# Patient Record
Sex: Male | Born: 1960 | Race: White | Hispanic: No | Marital: Single | Smoking: Current every day smoker
Health system: Southern US, Community
[De-identification: ages and names within clinical notes are randomized; demographics above are authoritative.]

## PROBLEM LIST (undated history)

## (undated) DIAGNOSIS — R7303 Prediabetes: Secondary | ICD-10-CM

## (undated) DIAGNOSIS — G4730 Sleep apnea, unspecified: Secondary | ICD-10-CM

## (undated) DIAGNOSIS — I10 Essential (primary) hypertension: Secondary | ICD-10-CM

## (undated) DIAGNOSIS — E785 Hyperlipidemia, unspecified: Secondary | ICD-10-CM

## (undated) DIAGNOSIS — F5104 Psychophysiologic insomnia: Secondary | ICD-10-CM

## (undated) DIAGNOSIS — R0600 Dyspnea, unspecified: Secondary | ICD-10-CM

## (undated) DIAGNOSIS — F419 Anxiety disorder, unspecified: Secondary | ICD-10-CM

## (undated) DIAGNOSIS — K222 Esophageal obstruction: Secondary | ICD-10-CM

## (undated) DIAGNOSIS — K219 Gastro-esophageal reflux disease without esophagitis: Secondary | ICD-10-CM

## (undated) DIAGNOSIS — M1990 Unspecified osteoarthritis, unspecified site: Secondary | ICD-10-CM

## (undated) DIAGNOSIS — K2100 Gastro-esophageal reflux disease with esophagitis, without bleeding: Secondary | ICD-10-CM

## (undated) DIAGNOSIS — I8500 Esophageal varices without bleeding: Secondary | ICD-10-CM

## (undated) DIAGNOSIS — J449 Chronic obstructive pulmonary disease, unspecified: Secondary | ICD-10-CM

## (undated) DIAGNOSIS — M2550 Pain in unspecified joint: Secondary | ICD-10-CM

## (undated) HISTORY — DX: Pain in unspecified joint: M25.50

## (undated) HISTORY — DX: Unspecified osteoarthritis, unspecified site: M19.90

## (undated) HISTORY — DX: Hyperlipidemia, unspecified: E78.5

## (undated) HISTORY — DX: Gastro-esophageal reflux disease with esophagitis: K21.0

## (undated) HISTORY — DX: Essential (primary) hypertension: I10

## (undated) HISTORY — DX: Gastro-esophageal reflux disease with esophagitis, without bleeding: K21.00

---

## 2012-11-18 ENCOUNTER — Ambulatory Visit: Payer: Self-pay

## 2013-11-07 DIAGNOSIS — E785 Hyperlipidemia, unspecified: Secondary | ICD-10-CM | POA: Insufficient documentation

## 2013-11-07 DIAGNOSIS — G43819 Other migraine, intractable, without status migrainosus: Secondary | ICD-10-CM | POA: Insufficient documentation

## 2013-11-07 DIAGNOSIS — K2990 Gastroduodenitis, unspecified, without bleeding: Secondary | ICD-10-CM | POA: Insufficient documentation

## 2013-11-07 DIAGNOSIS — G43119 Migraine with aura, intractable, without status migrainosus: Secondary | ICD-10-CM | POA: Insufficient documentation

## 2014-05-11 DIAGNOSIS — Z79899 Other long term (current) drug therapy: Secondary | ICD-10-CM | POA: Insufficient documentation

## 2017-09-29 ENCOUNTER — Encounter: Payer: Self-pay | Admitting: Nurse Practitioner

## 2017-09-29 ENCOUNTER — Other Ambulatory Visit: Payer: Self-pay

## 2017-09-29 ENCOUNTER — Ambulatory Visit: Payer: Medicaid Other | Attending: Nurse Practitioner | Admitting: Nurse Practitioner

## 2017-09-29 DIAGNOSIS — G4730 Sleep apnea, unspecified: Secondary | ICD-10-CM | POA: Insufficient documentation

## 2017-09-29 DIAGNOSIS — G8929 Other chronic pain: Secondary | ICD-10-CM | POA: Diagnosis not present

## 2017-09-29 DIAGNOSIS — I8500 Esophageal varices without bleeding: Secondary | ICD-10-CM | POA: Diagnosis not present

## 2017-09-29 DIAGNOSIS — M25552 Pain in left hip: Secondary | ICD-10-CM

## 2017-09-29 DIAGNOSIS — M5442 Lumbago with sciatica, left side: Secondary | ICD-10-CM | POA: Diagnosis not present

## 2017-09-29 DIAGNOSIS — F5104 Psychophysiologic insomnia: Secondary | ICD-10-CM | POA: Insufficient documentation

## 2017-09-29 DIAGNOSIS — G894 Chronic pain syndrome: Secondary | ICD-10-CM | POA: Insufficient documentation

## 2017-09-29 DIAGNOSIS — I10 Essential (primary) hypertension: Secondary | ICD-10-CM | POA: Insufficient documentation

## 2017-09-29 DIAGNOSIS — Z7982 Long term (current) use of aspirin: Secondary | ICD-10-CM | POA: Insufficient documentation

## 2017-09-29 DIAGNOSIS — K2970 Gastritis, unspecified, without bleeding: Secondary | ICD-10-CM | POA: Diagnosis not present

## 2017-09-29 DIAGNOSIS — E785 Hyperlipidemia, unspecified: Secondary | ICD-10-CM | POA: Insufficient documentation

## 2017-09-29 DIAGNOSIS — M25561 Pain in right knee: Secondary | ICD-10-CM | POA: Diagnosis not present

## 2017-09-29 DIAGNOSIS — N189 Chronic kidney disease, unspecified: Secondary | ICD-10-CM | POA: Diagnosis not present

## 2017-09-29 DIAGNOSIS — M79605 Pain in left leg: Secondary | ICD-10-CM | POA: Diagnosis not present

## 2017-09-29 DIAGNOSIS — E119 Type 2 diabetes mellitus without complications: Secondary | ICD-10-CM | POA: Insufficient documentation

## 2017-09-29 DIAGNOSIS — M1990 Unspecified osteoarthritis, unspecified site: Secondary | ICD-10-CM | POA: Diagnosis not present

## 2017-09-29 DIAGNOSIS — J449 Chronic obstructive pulmonary disease, unspecified: Secondary | ICD-10-CM | POA: Insufficient documentation

## 2017-09-29 DIAGNOSIS — F419 Anxiety disorder, unspecified: Secondary | ICD-10-CM | POA: Diagnosis not present

## 2017-09-29 DIAGNOSIS — M25551 Pain in right hip: Secondary | ICD-10-CM | POA: Diagnosis not present

## 2017-09-29 DIAGNOSIS — G44009 Cluster headache syndrome, unspecified, not intractable: Secondary | ICD-10-CM | POA: Insufficient documentation

## 2017-09-29 DIAGNOSIS — M79604 Pain in right leg: Secondary | ICD-10-CM | POA: Diagnosis not present

## 2017-09-29 DIAGNOSIS — Z79899 Other long term (current) drug therapy: Secondary | ICD-10-CM | POA: Insufficient documentation

## 2017-09-29 DIAGNOSIS — K222 Esophageal obstruction: Secondary | ICD-10-CM | POA: Insufficient documentation

## 2017-09-29 DIAGNOSIS — I129 Hypertensive chronic kidney disease with stage 1 through stage 4 chronic kidney disease, or unspecified chronic kidney disease: Secondary | ICD-10-CM | POA: Diagnosis not present

## 2017-09-29 DIAGNOSIS — M899 Disorder of bone, unspecified: Secondary | ICD-10-CM

## 2017-09-29 DIAGNOSIS — E1122 Type 2 diabetes mellitus with diabetic chronic kidney disease: Secondary | ICD-10-CM | POA: Diagnosis not present

## 2017-09-29 DIAGNOSIS — K210 Gastro-esophageal reflux disease with esophagitis: Secondary | ICD-10-CM | POA: Insufficient documentation

## 2017-09-29 DIAGNOSIS — Z87891 Personal history of nicotine dependence: Secondary | ICD-10-CM | POA: Insufficient documentation

## 2017-09-29 DIAGNOSIS — Z789 Other specified health status: Secondary | ICD-10-CM

## 2017-09-29 DIAGNOSIS — F17200 Nicotine dependence, unspecified, uncomplicated: Secondary | ICD-10-CM | POA: Insufficient documentation

## 2017-09-29 DIAGNOSIS — M25562 Pain in left knee: Secondary | ICD-10-CM

## 2017-09-29 NOTE — Patient Instructions (Signed)

## 2017-09-29 NOTE — Progress Notes (Signed)
Safety precautions to be maintained throughout the outpatient stay will include: orient to surroundings, keep bed in low position, maintain call bell within reach at all times, provide assistance with transfer out of bed and ambulation.  

## 2017-09-29 NOTE — Progress Notes (Signed)
Patient's Name: Johnny Williams  MRN: [id number]  Referring Provider: Frazier Richards, MD  DOB: 01-Jul-1960  PCP: [facility] [place]  DOS: 09/29/2017  Note by: Dionisio David NP  Service setting: Ambulatory outpatient  Specialty: Interventional Pain Management  Location: [facility] (AMB) Pain Management Facility    Patient type: New Patient    Primary Reason(s) for Visit: Initial Patient Evaluation CC: Back Pain (lower)  HPI  Mr. Shaker is a 57 y.o. year old, male patient, who comes today for an initial evaluation. He has Anxiety; Chronic insomnia; Cluster headaches; COPD (chronic obstructive pulmonary disease) ([place]); Esophageal varices ([place]); Essential hypertension; Gastritis and duodenitis; High risk medication use; Hyperlipidemia, unspecified; Sleep apnea; Smoker; Stricture of esophagus; Type 2 diabetes mellitus without complication ([place]); Variants of migraine, not elsewhere classified, with intractable migraine, so stated, without mention of status migrainosus; Chronic bilateral low back pain with left-sided sciatica (Primary Area of Pain); Chronic pain of both lower extremities (Secondary Area of Pain) (L>R); Chronic pain of both hips [facility] Area of Pain) (L>R); Chronic pain of both knees (Fourth Area of Pain) (L>R); Chronic pain syndrome; Pharmacologic therapy; Disorder of skeletal system; and Problems influencing health status on their problem list.. His primarily concern today is the Back Pain (lower)  Pain Assessment: Location: Lower Back Radiating: left buttocks down to knee Onset: More than a month ago Duration: Chronic pain Quality: Aching, Constant, Throbbing Severity: 10-Worst pain ever/10 (self-reported pain score)  Note: Reported level is compatible with observation. Clinically the patient looks like a 3/10 A 3/10 is viewed as "Moderate" and described as significantly interfering with activities of daily living (ADL). It becomes difficult to feed, bathe, get dressed, get on and off the  toilet or to perform personal hygiene functions. Difficult to get in and out of bed or a chair without assistance. Very distracting. With effort, it can be ignored when deeply involved in activities. Information on the proper use of the pain scale provided to the patient today. When using our objective Pain Scale, levels between 6 and 10/10 are said to belong in an emergency room, as it progressively worsens from a 6/10, described as severely limiting, requiring emergency care not usually available at an outpatient pain management facility. At a 6/10 level, communication becomes difficult and requires great effort. Assistance to reach the emergency department may be required. Facial flushing and profuse sweating along with potentially dangerous increases in heart rate and blood pressure will be evident. Effect on ADL: prolonged walking, sitting, Timing: Constant Modifying factors: rest, cold  Onset and Duration: Present longer than 3 months Cause of pain: Unknown Severity: No change since onset, NAS-11 at its worse: 10/10, NAS-11 at its best: 4/10, NAS-11 now: 10/10 and NAS-11 on the average: 10/10 Timing: Morning and Night Aggravating Factors: Bending, Kneeling, Lifiting, Nerve blocks, Prolonged sitting, Prolonged standing, Squatting, Twisting, Walking, Walking uphill, Walking downhill and Working Alleviating Factors: Stretching, Cold packs, Lying down, Resting and Sleeping Associated Problems: Pain that does not allow patient to sleep Quality of Pain: Sharp Previous Examinations or Tests: X-rays Previous Treatments: Physical Therapy and Stretching exercises  The patient comes into the clinics today for the first time for a chronic pain management evaluation. To the patient his primary area of pain is in his low back. He is a poor historian.He denies any previous surgeries or interventional therapy. He admits that he did to some physical therapy in 2016 which was effective. He went to the  chiropractor in 2017 which made his pain  worse. He admits that he had recent images in [facility].  His second area of pain is in his lower extremities. He admits that the left side is greater than the right. He admits that the pain goes down the back of the legs to his ankle. He denies previous nerve conduction study.  His third area of pain is in his hips. He admits the left is greater than the right. He admits that he has had a previous injection in his left hip which lasted about 30 minutes. He has not noted any physical therapy. He has had recent images.  His last area of pain is in his knees.He admits that the left is greater than the right. He did have a previous steroid injections last year his primary care office which was effective. He denies any  At [place]   Today I took the time to provide the patient with information regarding this pain practice. The patient was informed that the practice is divided into two sections: an interventional pain management section, as well as a completely separate and distinct medication management section. I explained that there are procedure days for interventional therapies, and evaluation days for follow-ups and medication management. Because of the amount of documentation required during both, they are kept separated. This means that there is the possibility that he may be scheduled for a procedure on one day, and medication management the next. I have also informed him that because of staffing and facility limitations, this practice will no longer take patients for medication management only. To illustrate the reasons for this, I gave the patient the example of surgeons, and how inappropriate it would be to refer a patient to his/her care, just to write for the post-surgical antibiotics on a surgery done by a different surgeon.   Because interventional pain management is part of the board-certified specialty for the doctors, the  patient was informed that joining this practice means that they are open to any and all interventional therapies. I made it clear that this does not mean that they will be forced to have any procedures done. What this means is that I believe interventional therapies to be essential part of the diagnosis and proper management of chronic pain conditions. Therefore, patients not interested in these interventional alternatives will be better served under the care of a different practitioner.  The patient was also made aware of my Comprehensive Pain Management Safety Guidelines where by joining this practice, they limit all of their nerve blocks and joint injections to those done by our practice, for as long as we are retained to manage their care. Historic Controlled Substance Pharmacotherapy Review  PMP and historical list of controlled substances: oxycodone/acetaminophen 5/325 Highest opioid analgesic regimen found: oxycodone/acetaminophen 5/325 (fill date 06/25/2016) oxycodone 15 mg per day Most recent opioid analgesic: none Current opioid analgesics: none Highest recorded MME/day: 22.5 mg/day MME/day: 0 mg/day Medications: The patient did not bring the medication(s) to the appointment, as requested in our "New Patient Package" Pharmacodynamics: Desired effects: Analgesia: The patient reports >50% benefit. Reported improvement in function: The patient reports medication allows him to accomplish basic ADLs. Clinically meaningful improvement in function (CMIF): Sustained CMIF goals met Perceived effectiveness: Described as relatively effective, allowing for increase in activities of daily living (ADL) Undesirable effects: Side-effects or Adverse reactions: None reported Historical Monitoring: The patient  has no drug history on file. List of all UDS Test(s): No results found for: MDMA, COCAINSCRNUR, PCPSCRNUR, [place], CANNABQUANT, THCU, [place]  List of all Serum Drug Screening Test(s):  No results  found for: AMPHSCRSER, BARBSCRSER, BENZOSCRSER, COCAINSCRSER, PCPSCRSER, PCPQUANT, THCSCRSER, CANNABQUANT, OPIATESCRSER, OXYSCRSER, PROPOXSCRSER Historical Background Evaluation: [place] PDMP: Six (6) year initial data search conducted.             [place] Department of public safety, offender search: Editor, commissioning Information) Non-contributory Risk Assessment Profile: Aberrant behavior: None observed or detected today Risk factors for fatal opioid overdose: caucasian and male gender Fatal overdose hazard ratio (HR): Calculation deferred Non-fatal overdose hazard ratio (HR): Calculation deferred Risk of opioid abuse or dependence: 0.7-3.0% with doses ? 36 MME/day and 6.1-26% with doses ? 120 MME/day. Substance use disorder (SUD) risk level: Pending results of Medical Psychology Evaluation for SUD Opioid risk tool (ORT) (Total Score): 3  ORT Scoring interpretation table:  Score <3 = Low Risk for SUD  Score between 4-7 = Moderate Risk for SUD  Score >8 = High Risk for Opioid Abuse   PHQ-2 Depression Scale:  Total score: 4  PHQ-2 Scoring interpretation table: (Score and probability of major depressive disorder)  Score 0 = No depression  Score 1 = 15.4% Probability  Score 2 = 21.1% Probability  Score 3 = 38.4% Probability  Score 4 = 45.5% Probability  Score 5 = 56.4% Probability  Score 6 = 78.6% Probability   PHQ-9 Depression Scale:  Total score: 12  PHQ-9 Scoring interpretation table:  Score 0-4 = No depression  Score 5-9 = Mild depression  Score 10-14 = Moderate depression  Score 15-19 = Moderately severe depression  Score 20-27 = Severe depression (2.4 times higher risk of SUD and 2.89 times higher risk of overuse)   Pharmacologic Plan: Pending ordered tests and/or consults  Meds  The patient has a current medication list which includes the following prescription(s): acetaminophen, aspirin-salicylamide-caffeine, atorvastatin, diclofenac sodium, hydrochlorothiazide, multiple vitamins-minerals,  omeprazole, varenicline, and verapamil.  Current Outpatient Medications on File Prior to Visit  Medication Sig  . acetaminophen (TYLENOL) 325 MG tablet Take 650 mg by mouth every 6 (six) hours as needed.  . Aspirin-Salicylamide-Caffeine (ARTHRITIS STRENGTH BC POWDER PO) Take by mouth.  Marland Kitchen atorvastatin (LIPITOR) 40 MG tablet Take 40 mg by mouth daily.  . diclofenac sodium (VOLTAREN) 1 % GEL Apply topically 4 (four) times daily.  . hydrochlorothiazide (HYDRODIURIL) 25 MG tablet Take 25 mg by mouth daily.  . Multiple Vitamins-Minerals (ONE-A-DAY 50 PLUS PO) Take by mouth.  Marland Kitchen omeprazole (PRILOSEC) 20 MG capsule Take 20 mg by mouth daily.  . varenicline (CHANTIX) 1 MG tablet Take 1 mg by mouth 2 (two) times daily.  . verapamil (VERELAN PM) 240 MG 24 hr capsule Take 240 mg by mouth at bedtime.   No current facility-administered medications on file prior to visit.    Imaging Review   Note: Available results from prior imaging studies were reviewed.        ROS  Cardiovascular History: No reported cardiovascular signs or symptoms such as High blood pressure, coronary artery disease, abnormal heart rate or rhythm, heart attack, blood thinner therapy or heart weakness and/or failure Pulmonary or Respiratory History: Lung problems, Shortness of breath, Snoring  and Temporary stoppage of breathing during sleep Neurological History: No reported neurological signs or symptoms such as seizures, abnormal skin sensations, urinary and/or fecal incontinence, being born with an abnormal open spine and/or a tethered spinal cord Review of Past Neurological Studies: No results found for this or any previous visit. Psychological-Psychiatric History: No reported psychological or psychiatric signs or symptoms such as  difficulty sleeping, anxiety, depression, delusions or hallucinations (schizophrenial), mood swings (bipolar disorders) or suicidal ideations or attempts Gastrointestinal History: Reflux or  heatburn Genitourinary History: No reported renal or genitourinary signs or symptoms such as difficulty voiding or producing urine, peeing blood, non-functioning kidney, kidney stones, difficulty emptying the bladder, difficulty controlling the flow of urine, or chronic kidney disease Hematological History: No reported hematological signs or symptoms such as prolonged bleeding, low or poor functioning platelets, bruising or bleeding easily, hereditary bleeding problems, low energy levels due to low hemoglobin or being anemic Endocrine History: No reported endocrine signs or symptoms such as high or low blood sugar, rapid heart rate due to high thyroid levels, obesity or weight gain due to slow thyroid or thyroid disease Rheumatologic History: No reported rheumatological signs and symptoms such as fatigue, joint pain, tenderness, swelling, redness, heat, stiffness, decreased range of motion, with or without associated rash Musculoskeletal History: Negative for myasthenia gravis, muscular dystrophy, multiple sclerosis or malignant hyperthermia Work History: Disabled  Allergies  Mr. Sheckler has No Known Allergies.  Laboratory Chemistry  Inflammation Markers No results found for: CRP, ESRSEDRATE (CRP: Acute Phase) (ESR: Chronic Phase) Renal Function Markers No results found for: BUN, CREATININE, GFRAA, GFRNONAA Hepatic Function Markers No results found for: AST, ALT, ALBUMIN, ALKPHOS, HCVAB Electrolytes No results found for: NA, K, CL, CALCIUM, MG Neuropathy Markers No results found for: [id number] Bone Pathology Markers No results found for: Hendricks Milo, VD125OH2TOT, [id number], [id number], 25OHVITD1, 25OHVITD2, 25OHVITD3, CALCIUM, TESTOFREE, TESTOSTERONE Coagulation Parameters No results found for: INR, LABPROT, APTT, PLT Cardiovascular Markers No results found for: BNP, HGB, HCT Note: Lab results reviewed.  PFSH  Drug: Mr. Fenter  has no drug history on file. Alcohol:  reports that he  drank alcohol. Tobacco:  reports that he has been smoking.  He has a 80.00 pack-year smoking history. He has never used smokeless tobacco. Medical:  has a past medical history of Arthritis, Hyperlipidemia, Hypertension, Joint ache, and Reflux esophagitis. Family: family history includes Heart disease in his mother; Hypertension in his mother.   The histories are not reviewed yet. Please review them in the "History" navigator section and refresh this [place]. Active Ambulatory Problems    Diagnosis Date Noted  . Anxiety 09/29/2017  . Chronic insomnia 09/29/2017  . Cluster headaches 09/29/2017  . COPD (chronic obstructive pulmonary disease) ([place]) 09/29/2017  . Esophageal varices ([place]) 09/29/2017  . Essential hypertension 09/29/2017  . Gastritis and duodenitis 11/07/2013  . High risk medication use 05/11/2014  . Hyperlipidemia, unspecified 11/07/2013  . Sleep apnea 09/29/2017  . Smoker 09/29/2017  . Stricture of esophagus 09/29/2017  . Type 2 diabetes mellitus without complication ([place]) 17/51/0258  . Variants of migraine, not elsewhere classified, with intractable migraine, so stated, without mention of status migrainosus 11/07/2013  . Chronic bilateral low back pain with left-sided sciatica (Primary Area of Pain) 09/29/2017  . Chronic pain of both lower extremities (Secondary Area of Pain) (L>R) 09/29/2017  . Chronic pain of both hips [facility] Area of Pain) (L>R) 09/29/2017  . Chronic pain of both knees (Fourth Area of Pain) (L>R) 09/29/2017  . Chronic pain syndrome 09/29/2017  . Pharmacologic therapy 09/29/2017  . Disorder of skeletal system 09/29/2017  . Problems influencing health status 09/29/2017   Resolved Ambulatory Problems    Diagnosis Date Noted  . No Resolved Ambulatory Problems   Past Medical History:  Diagnosis Date  . Arthritis   . Hyperlipidemia   . Hypertension   . Joint ache   .  Reflux esophagitis    Constitutional Exam  General appearance: Well  nourished, well developed, and well hydrated. In no apparent acute distress Vitals:   09/29/17 1310  BP: 130/81  Pulse: 93  Resp: 16  Temp: 98.2 F (36.8 C)  SpO2: 97%  Weight: 300 lb (136.1 kg)  Height: _0  (1.88 m)   BMI Assessment: Estimated body mass index is 38.52 kg/m as calculated from the following:   Height as of this encounter: _1  (1.88 m).   Weight as of this encounter: 300 lb (136.1 kg).  BMI interpretation table: BMI level Category Range association with higher incidence of chronic pain  <18 kg/m2 Underweight   18.5-24.9 kg/m2 Ideal body weight   25-29.9 kg/m2 Overweight Increased incidence by 20%  30-34.9 kg/m2 Obese (Class I) Increased incidence by 68%  35-39.9 kg/m2 Severe obesity (Class II) Increased incidence by 136%  >40 kg/m2 Extreme obesity (Class III) Increased incidence by 254%   BMI Readings from Last 4 Encounters:  09/29/17 38.52 kg/m   Wt Readings from Last 4 Encounters:  09/29/17 300 lb (136.1 kg)  Psych/Mental status: Alert, oriented x 3 (person, place, & time)       Eyes: PERLA Respiratory: No evidence of acute respiratory distress  Cervical Spine Exam  Inspection: No masses, redness, or swelling Alignment: Symmetrical Functional ROM: Unrestricted ROM      Stability: No instability detected Muscle strength & Tone: Functionally intact Sensory: Unimpaired Palpation: No palpable anomalies              Upper Extremity (UE) Exam    Side: Right upper extremity  Side: Left upper extremity  Inspection: No masses, redness, swelling, or asymmetry. No contractures  Inspection: No masses, redness, swelling, or asymmetry. No contractures  Functional ROM: Unrestricted ROM          Functional ROM: Unrestricted ROM          Muscle strength & Tone: Functionally intact  Muscle strength & Tone: Functionally intact  Sensory: Unimpaired  Sensory: Unimpaired  Palpation: No palpable anomalies              Palpation: No palpable anomalies               Specialized Test(s): Deferred         Specialized Test(s): Deferred          Thoracic Spine Exam  Inspection: No masses, redness, or swelling Alignment: Symmetrical Functional ROM: Unrestricted ROM Stability: No instability detected Sensory: Unimpaired Muscle strength & Tone: No palpable anomalies  Lumbar Spine Exam  Inspection: No masses, redness, or swelling Alignment: Symmetrical Functional ROM: Adequate ROM      Stability: No instability detected Muscle strength & Tone: Functionally intact Sensory: Unimpaired Palpation: Complains of area being tender to palpation       Provocative Tests: Lumbar Hyperextension and rotation test: Positive bilaterally for facet joint pain. Patrick's Maneuver: Positive for left-sided S-I arthralgia and for left hip arthralgia  Gait & Posture Assessment  Ambulation: Unassisted Gait: Relatively normal for age and body habitus Posture: WNL   Lower Extremity Exam    Side: Right lower extremity  Side: Left lower extremity  Inspection: No masses, redness, swelling, or asymmetry. No contractures  Inspection: No masses, redness, swelling, or asymmetry. No contractures  Functional ROM: Unrestricted ROM          Functional ROM: Unrestricted ROM          Muscle strength & Tone: Able to Toe-walk & Heel-walk  without problems  Muscle strength & Tone: Able to Toe-walk & Heel-walk without problems  Sensory: Unimpaired  Sensory: Unimpaired  Palpation: No palpable anomalies  Palpation: No palpable anomalies   Assessment  Primary Diagnosis & Pertinent Problem List: Diagnoses of Chronic bilateral low back pain with left-sided sciatica (Primary Area of Pain), Chronic pain of both lower extremities (Secondary Area of Pain) (L>R), Chronic pain of both hips (Tertiary Area of Pain) (L>R), Chronic pain of both knees (Fourth Area of Pain) (L>R), Chronic pain syndrome, Pharmacologic therapy, Disorder of skeletal system, and Problems influencing health status were  pertinent to this visit.  Visit Diagnosis: 1. Chronic bilateral low back pain with left-sided sciatica (Primary Area of Pain)   2. Chronic pain of both lower extremities (Secondary Area of Pain) (L>R)   3. Chronic pain of both hips [facility] Area of Pain) (L>R)   4. Chronic pain of both knees (Fourth Area of Pain) (L>R)   5. Chronic pain syndrome   6. Pharmacologic therapy   7. Disorder of skeletal system   8. Problems influencing health status    Plan of Care  Initial treatment plan:  Please be advised that as per protocol, today's visit has been an evaluation only. We have not taken over the patient's controlled substance management.  Problem-specific plan: No problem-specific Assessment & Plan notes found for this encounter.  Ordered Lab-work, Procedure(s), Referral(s), & Consult(s): No orders of the defined types were placed in this encounter.  Pharmacotherapy: Medications ordered:  No orders of the defined types were placed in this encounter.  Medications administered during this visit: Chayden Garrelts had no medications administered during this visit.   Pharmacotherapy under consideration:  Opioid Analgesics: The patient was informed that there is no guarantee that he would be a candidate for opioid analgesics. The decision will be made following CDC guidelines. This decision will be based on the results of diagnostic studies, as well as Mr. Soto risk profile.  Membrane stabilizer: To be determined at a later time Muscle relaxant: To be determined at a later time NSAID: To be determined at a later time Other analgesic(s): To be determined at a later time   Interventional therapies under consideration: Mr. Gertz was informed that there is no guarantee that he would be a candidate for interventional therapies. The decision will be based on the results of diagnostic studies, as well as Mr. Merriott risk profile.  Possible procedure(s): Diagnostic bilateral lumbar facet nerve  block Possible bilateral bilateral lumbar facet RFA Diagnostic left sided LESI Diagnostic left intra-articular hip injection Diagnostic left intra-articular knee injection bilateral Hyalgan series Diagnostic left genicular nerve block Possible left genicular nerve artifact   Provider-requested follow-up: Return for 2nd Visit, w/ Dr. Dossie Arbour, after [facility] eval, medical record release.  No future appointments.  Primary Care Physician: [facility] [place] Location: [facility] Outpatient Pain Management Facility Note by:  Date: 09/29/2017; Time: 1:58 PM  Pain Score Disclaimer: We use the NRS-11 scale. This is a self-reported, subjective measurement of pain severity with only modest accuracy. It is used primarily to identify changes within a particular patient. It must be understood that outpatient pain scales are significantly less accurate that those used for research, where they can be applied under ideal controlled circumstances with minimal exposure to variables. In reality, the score is likely to be a combination of pain intensity and pain affect, where pain affect describes the degree of emotional arousal or changes in action readiness caused by the sensory experience of pain.  Factors such as social and work situation, setting, emotional state, anxiety levels, expectation, and prior pain experience may influence pain perception and show large inter-individual differences that may also be affected by time variables.  Patient instructions provided during this appointment: Patient Instructions   ____________________________________________________________________________________________  Appointment Policy Summary  It is our goal and responsibility to provide the medical community with assistance in the evaluation and management of patients with chronic pain. Unfortunately our resources are limited. Because we do not have an unlimited amount of time, or available appointments, we are  required to closely monitor and manage their use. The following rules exist to maximize their use:  Patient's responsibilities: 1. Punctuality:  At what time should I arrive? You should be physically present in our office 30 minutes before your scheduled appointment. Your scheduled appointment is with your assigned healthcare provider. However, it takes 5-10 minutes to be "checked-in", and another 15 minutes for the nurses to do the admission. If you arrive to our office at the time you were given for your appointment, you will end up being at least 20-25 minutes late to your appointment with the provider. 2. Tardiness:  What happens if I arrive only a few minutes after my scheduled appointment time? You will need to reschedule your appointment. The cutoff is your appointment time. This is why it is so important that you arrive at least 30 minutes before that appointment. If you have an appointment scheduled for 10:00 AM and you arrive at 10:01, you will be required to reschedule your appointment.  3. Plan ahead:  Always assume that you will encounter traffic on your way in. Plan for it. If you are dependent on a driver, make sure they understand these rules and the need to arrive early. 4. Other appointments and responsibilities:  Avoid scheduling any other appointments before or after your pain clinic appointments.  5. Be prepared:  Write down everything that you need to discuss with your healthcare provider and give this information to the admitting nurse. Write down the medications that you will need refilled. Bring your pills and bottles (even the empty ones), to all of your appointments, except for those where a procedure is scheduled. 6. No children or pets:  Find someone to take care of them. It is not appropriate to bring them in. 7. Scheduling changes:  We request "advanced notification" of any changes or cancellations. 8. Advanced notification:  Defined as a time period of more than 24  hours prior to the originally scheduled appointment. This allows for the appointment to be offered to other patients. 9. Rescheduling:  When a visit is rescheduled, it will require the cancellation of the original appointment. For this reason they both fall within the category of "Cancellations".  10. Cancellations:  They require advanced notification. Any cancellation less than 24 hours before the  appointment will be recorded as a "No Show". 11. No Show:  Defined as an unkept appointment where the patient failed to notify or declare to the practice their intention or inability to keep the appointment.  Corrective process for repeat offenders:  1. Tardiness: Three (3) episodes of rescheduling due to late arrivals will be recorded as one (1) "No Show". 2. Cancellation or reschedule: Three (3) cancellations or rescheduling will be recorded as one (1) "No Show". 3. "No Shows": Three (3) "No Shows" within a 12 month period will result in discharge from the practice. ____________________________________________________________________________________________  ____________________________________________________________________________________________  Pain Scale  Introduction: The pain score used by this  practice is the Verbal Numerical Rating Scale (VNRS-11). This is an 11-point scale. It is for adults and children 10 years or older. There are significant differences in how the pain score is reported, used, and applied. Forget everything you learned in the past and learn this scoring system.  General Information: The scale should reflect your current level of pain. Unless you are specifically asked for the level of your worst pain, or your average pain. If you are asked for one of these two, then it should be understood that it is over the past 24 hours.  Basic Activities of Daily Living (ADL): Personal hygiene, dressing, eating, transferring, and using restroom.  Instructions: Most patients  tend to report their level of pain as a combination of two factors, their physical pain and their psychosocial pain. This last one is also known as "suffering" and it is reflection of how physical pain affects you socially and psychologically. From now on, report them separately. From this point on, when asked to report your pain level, report only your physical pain. Use the following table for reference.  Pain Clinic Pain Levels (0-5/10)  Pain Level Score  Description  No Pain 0   Mild pain 1 Nagging, annoying, but does not interfere with basic activities of daily living (ADL). Patients are able to eat, bathe, get dressed, toileting (being able to get on and off the toilet and perform personal hygiene functions), transfer (move in and out of bed or a chair without assistance), and maintain continence (able to control bladder and bowel functions). Blood pressure and heart rate are unaffected. A normal heart rate for a healthy adult ranges from 60 to 100 bpm (beats per minute).   Mild to moderate pain 2 Noticeable and distracting. Impossible to hide from other people. More frequent flare-ups. Still possible to adapt and function close to normal. It can be very annoying and may have occasional stronger flare-ups. With discipline, patients may get used to it and adapt.   Moderate pain 3 Interferes significantly with activities of daily living (ADL). It becomes difficult to feed, bathe, get dressed, get on and off the toilet or to perform personal hygiene functions. Difficult to get in and out of bed or a chair without assistance. Very distracting. With effort, it can be ignored when deeply involved in activities.   Moderately severe pain 4 Impossible to ignore for more than a few minutes. With effort, patients may still be able to manage work or participate in some social activities. Very difficult to concentrate. Signs of autonomic nervous system discharge are evident: dilated pupils (mydriasis); mild  sweating (diaphoresis); sleep interference. Heart rate becomes elevated (>115 bpm). Diastolic blood pressure (lower number) rises above 100 mmHg. Patients find relief in laying down and not moving.   Severe pain 5 Intense and extremely unpleasant. Associated with frowning face and frequent crying. Pain overwhelms the senses.  Ability to do any activity or maintain social relationships becomes significantly limited. Conversation becomes difficult. Pacing back and forth is common, as getting into a comfortable position is nearly impossible. Pain wakes you up from deep sleep. Physical signs will be obvious: pupillary dilation; increased sweating; goosebumps; brisk reflexes; cold, clammy hands and feet; nausea, vomiting or dry heaves; loss of appetite; significant sleep disturbance with inability to fall asleep or to remain asleep. When persistent, significant weight loss is observed due to the complete loss of appetite and sleep deprivation.  Blood pressure and heart rate becomes significantly elevated. Caution: If elevated blood  pressure triggers a pounding headache associated with blurred vision, then the patient should immediately seek attention at an urgent or emergency care unit, as these may be signs of an impending stroke.    Emergency Department Pain Levels (6-10/10)  Emergency Room Pain 6 Severely limiting. Requires emergency care and should not be seen or managed at an outpatient pain management facility. Communication becomes difficult and requires great effort. Assistance to reach the emergency department may be required. Facial flushing and profuse sweating along with potentially dangerous increases in heart rate and blood pressure will be evident.   Distressing pain 7 Self-care is very difficult. Assistance is required to transport, or use restroom. Assistance to reach the emergency department will be required. Tasks requiring coordination, such as bathing and getting dressed become very  difficult.   Disabling pain 8 Self-care is no longer possible. At this level, pain is disabling. The individual is unable to do even the most "basic" activities such as walking, eating, bathing, dressing, transferring to a bed, or toileting. Fine motor skills are lost. It is difficult to think clearly.   Incapacitating pain 9 Pain becomes incapacitating. Thought processing is no longer possible. Difficult to remember your own name. Control of movement and coordination are lost.   The worst pain imaginable 10 At this level, most patients pass out from pain. When this level is reached, collapse of the autonomic nervous system occurs, leading to a sudden drop in blood pressure and heart rate. This in turn results in a temporary and dramatic drop in blood flow to the brain, leading to a loss of consciousness. Fainting is one of the body's self defense mechanisms. Passing out puts the brain in a calmed state and causes it to shut down for a while, in order to begin the healing process.    Summary: 1. Refer to this scale when providing [place] with your pain level. 2. Be accurate and careful when reporting your pain level. This will help with your care. 3. Over-reporting your pain level will lead to loss of credibility. 4. Even a level of 1/10 means that there is pain and will be treated at our facility. 5. High, inaccurate reporting will be documented as "Symptom Exaggeration", leading to loss of credibility and suspicions of possible secondary gains such as obtaining more narcotics, or wanting to appear disabled, for fraudulent reasons. 6. Only pain levels of 5 or below will be seen at our facility. 7. Pain levels of 6 and above will be sent to the Emergency Department and the appointment cancelled. ____________________________________________________________________________________________

## 2017-10-03 LAB — 25-HYDROXYVITAMIN D LCMS D2+D3: 25-HYDROXY, VITAMIN D: 27 ng/mL — AB

## 2017-10-03 LAB — COMP. METABOLIC PANEL (12)
AST: 16 IU/L (ref 0–40)
Albumin/Globulin Ratio: 2.2 (ref 1.2–2.2)
Albumin: 4.8 g/dL (ref 3.5–5.5)
Alkaline Phosphatase: 67 IU/L (ref 39–117)
BILIRUBIN TOTAL: 0.2 mg/dL (ref 0.0–1.2)
BUN / CREAT RATIO: 15 (ref 9–20)
BUN: 14 mg/dL (ref 6–24)
CREATININE: 0.91 mg/dL (ref 0.76–1.27)
Calcium: 9.8 mg/dL (ref 8.7–10.2)
Chloride: 102 mmol/L (ref 96–106)
GFR calc Af Amer: 108 mL/min/{1.73_m2} (ref >59)
GFR calc non Af Amer: 93 mL/min/{1.73_m2} (ref >59)
Globulin, Total: 2.2 g/dL (ref 1.5–4.5)
Glucose: 96 mg/dL (ref 65–99)
Potassium: 4.1 mmol/L (ref 3.5–5.2)
Sodium: 144 mmol/L (ref 134–144)
TOTAL PROTEIN: 7 g/dL (ref 6.0–8.5)

## 2017-10-03 LAB — COMPLIANCE DRUG ANALYSIS, UR

## 2017-10-03 LAB — SEDIMENTATION RATE: Sed Rate: 7 mm/hr (ref 0–30)

## 2017-10-03 LAB — MAGNESIUM: MAGNESIUM: 1.9 mg/dL (ref 1.6–2.3)

## 2017-10-03 LAB — C-REACTIVE PROTEIN: CRP: 1 mg/L (ref 0.0–4.9)

## 2017-10-03 LAB — VITAMIN B12: Vitamin B-12: 606 pg/mL (ref 232–1245)

## 2017-10-03 LAB — 25-HYDROXY VITAMIN D LCMS D2+D3: 25-Hydroxy, Vitamin D-3: 27 ng/mL

## 2017-10-12 ENCOUNTER — Other Ambulatory Visit: Payer: Self-pay

## 2017-10-12 ENCOUNTER — Ambulatory Visit
Admission: RE | Admit: 2017-10-12 | Discharge: 2017-10-12 | Disposition: A | Discharge status: Home / Self Care | Payer: Medicaid Other | Source: Ambulatory Visit | Attending: Pain Medicine | Admitting: Pain Medicine

## 2017-10-12 ENCOUNTER — Encounter: Payer: Self-pay | Admitting: Pain Medicine

## 2017-10-12 ENCOUNTER — Ambulatory Visit: Payer: Medicaid Other | Attending: Pain Medicine | Admitting: Pain Medicine

## 2017-10-12 VITALS — BP 125/75 | HR 81 | Temp 97.7°F | Resp 18 | Ht 72.0 in | Wt 290.0 lb

## 2017-10-12 DIAGNOSIS — M79604 Pain in right leg: Principal | ICD-10-CM

## 2017-10-12 DIAGNOSIS — M25551 Pain in right hip: Secondary | ICD-10-CM | POA: Insufficient documentation

## 2017-10-12 DIAGNOSIS — I10 Essential (primary) hypertension: Secondary | ICD-10-CM | POA: Insufficient documentation

## 2017-10-12 DIAGNOSIS — K2970 Gastritis, unspecified, without bleeding: Secondary | ICD-10-CM | POA: Insufficient documentation

## 2017-10-12 DIAGNOSIS — G8929 Other chronic pain: Secondary | ICD-10-CM | POA: Insufficient documentation

## 2017-10-12 DIAGNOSIS — Z79899 Other long term (current) drug therapy: Secondary | ICD-10-CM

## 2017-10-12 DIAGNOSIS — G44009 Cluster headache syndrome, unspecified, not intractable: Secondary | ICD-10-CM | POA: Insufficient documentation

## 2017-10-12 DIAGNOSIS — M25552 Pain in left hip: Secondary | ICD-10-CM

## 2017-10-12 DIAGNOSIS — M25561 Pain in right knee: Secondary | ICD-10-CM

## 2017-10-12 DIAGNOSIS — I8500 Esophageal varices without bleeding: Secondary | ICD-10-CM | POA: Diagnosis not present

## 2017-10-12 DIAGNOSIS — R937 Abnormal findings on diagnostic imaging of other parts of musculoskeletal system: Secondary | ICD-10-CM | POA: Insufficient documentation

## 2017-10-12 DIAGNOSIS — K2980 Duodenitis without bleeding: Secondary | ICD-10-CM | POA: Diagnosis not present

## 2017-10-12 DIAGNOSIS — K222 Esophageal obstruction: Secondary | ICD-10-CM | POA: Insufficient documentation

## 2017-10-12 DIAGNOSIS — M25562 Pain in left knee: Secondary | ICD-10-CM | POA: Diagnosis not present

## 2017-10-12 DIAGNOSIS — M545 Low back pain: Secondary | ICD-10-CM | POA: Insufficient documentation

## 2017-10-12 DIAGNOSIS — E559 Vitamin D deficiency, unspecified: Secondary | ICD-10-CM | POA: Diagnosis not present

## 2017-10-12 DIAGNOSIS — M160 Bilateral primary osteoarthritis of hip: Secondary | ICD-10-CM | POA: Diagnosis not present

## 2017-10-12 DIAGNOSIS — M47814 Spondylosis without myelopathy or radiculopathy, thoracic region: Secondary | ICD-10-CM | POA: Insufficient documentation

## 2017-10-12 DIAGNOSIS — M79605 Pain in left leg: Secondary | ICD-10-CM | POA: Insufficient documentation

## 2017-10-12 DIAGNOSIS — G894 Chronic pain syndrome: Secondary | ICD-10-CM

## 2017-10-12 DIAGNOSIS — M5442 Lumbago with sciatica, left side: Secondary | ICD-10-CM

## 2017-10-12 DIAGNOSIS — Z789 Other specified health status: Secondary | ICD-10-CM | POA: Diagnosis not present

## 2017-10-12 DIAGNOSIS — M166 Other bilateral secondary osteoarthritis of hip: Secondary | ICD-10-CM

## 2017-10-12 DIAGNOSIS — J449 Chronic obstructive pulmonary disease, unspecified: Secondary | ICD-10-CM | POA: Diagnosis not present

## 2017-10-12 DIAGNOSIS — G4733 Obstructive sleep apnea (adult) (pediatric): Secondary | ICD-10-CM | POA: Diagnosis not present

## 2017-10-12 DIAGNOSIS — E785 Hyperlipidemia, unspecified: Secondary | ICD-10-CM | POA: Diagnosis not present

## 2017-10-12 DIAGNOSIS — M47816 Spondylosis without myelopathy or radiculopathy, lumbar region: Secondary | ICD-10-CM | POA: Diagnosis not present

## 2017-10-12 DIAGNOSIS — I700 Atherosclerosis of aorta: Secondary | ICD-10-CM | POA: Diagnosis not present

## 2017-10-12 DIAGNOSIS — F419 Anxiety disorder, unspecified: Secondary | ICD-10-CM | POA: Diagnosis not present

## 2017-10-12 DIAGNOSIS — M899 Disorder of bone, unspecified: Secondary | ICD-10-CM | POA: Diagnosis not present

## 2017-10-12 DIAGNOSIS — E119 Type 2 diabetes mellitus without complications: Secondary | ICD-10-CM | POA: Insufficient documentation

## 2017-10-12 MED ORDER — MAGNESIUM 500 MG PO CAPS: 500.0000 mg | ORAL_CAPSULE | Freq: Two times a day (BID) | ORAL | 5 refills | Status: AC

## 2017-10-12 MED ORDER — CYANOCOBALAMIN 2000 MCG PO TABS: 2000.0000 ug | ORAL_TABLET | Freq: Every day | ORAL | 0 refills | Status: AC

## 2017-10-12 MED ORDER — VITAMIN D3 125 MCG (5000 UT) PO CAPS: 1.0000 | ORAL_CAPSULE | Freq: Every day | ORAL | 5 refills | Status: AC

## 2017-10-12 MED ORDER — GNP CALCIUM 1200 1200-1000 MG-UNIT PO CHEW
1200.0000 mg | CHEWABLE_TABLET | Freq: Every day | ORAL | 5 refills | Status: DC
Start: 2017-10-12 — End: 2018-05-05

## 2017-10-12 MED ORDER — ERGOCALCIFEROL 1.25 MG (50000 UT) PO CAPS: 50000.0000 [IU] | ORAL_CAPSULE | ORAL | 0 refills | Status: AC

## 2017-10-12 NOTE — Progress Notes (Signed)
Patient's Name: Johnny Williams  MRN: [id number]  Referring Provider: [facility], [place]*  DOB: 07-28-1960  PCP: [facility], [place]  DOS: 10/12/2017  Note by: Gaspar Cola, MD  Service setting: Ambulatory outpatient  Specialty: Interventional Pain Management  Location: [facility] (AMB) Pain Management Facility    Patient type: Established   Primary Reason(s) for Visit: Encounter for evaluation before starting new chronic pain management plan of care (Level of risk: moderate) CC: Hip Pain (bilateral, worse in left)  HPI  Johnny Williams is a 57 y.o. year old, male patient, who comes today for a follow-up evaluation to review the test results and decide on a treatment plan. 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 His primarily concern today is the Hip Pain (bilateral, worse in left)  Pain Assessment: Location: Right, Left Hip Radiating: left upper leg Onset: More than a month ago Duration: Chronic pain Quality: Sharp Severity: 5 /10 (self-reported pain score)  Note: Reported level  is inconsistent with clinical observations. Clinically the patient looks like a 3/10 A 3/10 is viewed as "Moderate" and described as significantly interfering with activities of daily living (ADL). It becomes difficult to feed, bathe, get dressed, get on and off the toilet or to perform personal hygiene functions. Difficult to get in and out of bed or a chair without assistance. Very distracting. With effort, it can be ignored when deeply involved in activities. Information on the proper use of the pain scale provided to the patient today. When using our objective Pain Scale, levels between 6 and 10/10 are said to belong in an emergency room, as it progressively worsens from a 6/10, described as severely limiting, requiring emergency care not usually available at an outpatient pain management facility. At a 6/10 level, communication becomes difficult and requires great effort. Assistance to reach the emergency department may be required. Facial flushing and profuse sweating along with potentially dangerous increases in heart rate and blood pressure will be evident. Timing: Intermittent Modifying factors: lying down  Johnny Williams comes in today for a follow-up visit after his initial evaluation on 09/29/2017. Today we went over the results of his tests. These were explained in "Layman's terms". During today's appointment we went over my diagnostic impression, as well as the proposed treatment plan.  According to the patient, his primary area of pain is in his hips. He admits the left is greater than the right (L>R). He admits that he has had a previous injection in his left hip which lasted about 30 minutes. He has not noted any physical therapy. He has had recent images.  His secondary area of pain is in his low back. He is a poor historian. He denies any previous surgeries or interventional therapy. He admits that he did some physical therapy in 2016 which was  effective. He went to the chiropractor in 2017 which  made his pain worse. He admits that he had recent images in [facility].  His tertiary area of pain is in his lower extremities. He admits that the left side is greater than the right (L>R). He admits that the pain goes down the back of the legs to his ankle. He denies previous nerve conduction study.  His fourth area of pain is in his knees.He admits that the left is greater than the right (L>R). He did have a previous steroid injections last year his primary care office which was effective. He denies any  At [facility]  In considering the treatment plan options, Johnny Williams was reminded that I no longer take patients for medication management only. I asked him to let me know if he had no intention of taking advantage of the interventional therapies, so that we could make arrangements to provide this space to someone interested. I also made it clear that undergoing interventional therapies for the purpose of getting pain medications is very inappropriate on the part of a patient, and it will not be tolerated in this practice. This type of behavior would suggest true addiction and therefore it requires referral to an addiction specialist.   Further details on both, my assessment(s), as well as the proposed treatment plan, please see below.  Controlled Substance Pharmacotherapy Assessment REMS (Risk Evaluation and Mitigation Strategy)  Analgesic: none Highest recorded MME/day: 22.5 mg/day MME/day: 0 mg/day Pill Count: None expected due to no prior prescriptions written by our practice. Landis Martins, RN  10/12/2017  8:08 AM  Sign at close encounter Safety precautions to be maintained throughout the outpatient stay will include: orient to surroundings, keep bed in low position, maintain call bell within reach at all times, provide assistance with transfer out of bed and ambulation.    Pharmacokinetics: Liberation and absorption (onset of action): WNL Distribution (time to peak  effect): WNL Metabolism and excretion (duration of action): WNL         Pharmacodynamics: Desired effects: Analgesia: Mr. Capizzi reports >50% benefit. Functional ability: Patient reports that medication allows him to accomplish basic ADLs Clinically meaningful improvement in function (CMIF): Sustained CMIF goals met Perceived effectiveness: Described as relatively effective, allowing for increase in activities of daily living (ADL) Undesirable effects: Side-effects or Adverse reactions: None reported Monitoring: [place] PMP: Online review of the past 80-monthperiod previously conducted. Not applicable at this point since we have not taken over the patient's medication management yet. List of all UDS test(s) done:  Lab Results  Component Value Date   SUMMARY FINAL 09/29/2017   Last UDS on record: Summary  Date Value Ref Range Status  09/29/2017 FINAL  Final    Comment:    ==================================================================== TOXAS[facility]RE COMP DRUG ANALYSIS,UR ==================================================================== Test                             Result       Flag       Units Drug Present and Declared for Prescription Verification   Acetaminophen                  PRESENT      EXPECTED   Salicylate                     PRESENT      EXPECTED   Verapamil  PRESENT      EXPECTED Drug Present not Declared for Prescription Verification   Diphenhydramine                PRESENT      UNEXPECTED Drug Absent but Declared for Prescription Verification   Diclofenac                     Not Detected UNEXPECTED    Topical diclofenac, as indicated in the declared medication list,    is not always detected even when used as directed. ==================================================================== Test                      Result    Flag   Units      Ref Range   Creatinine              163              mg/dL       >=20 ==================================================================== Declared Medications:  The flagging and interpretation on this report are based on the  following declared medications.  Unexpected results may arise from  inaccuracies in the declared medications.  **Note: The testing scope of this panel includes these medications:  Verapamil (Verelan PM)  **Note: The testing scope of this panel does not include small to  moderate amounts of these reported medications:  Acetaminophen  Aspirin  Topical Diclofenac  **Note: The testing scope of this panel does not include following  reported medications:  Atorvastatin (Lipitor)  Caffeine  Hydrochlorothiazide (Hydrodiuril)  Multivitamin  Omeprazole (Prilosec)  Varenicline (Chantix) ==================================================================== For clinical consultation, please call [phone]. ====================================================================    UDS interpretation: No unexpected findings.          Medication Assessment Form: Patient introduced to form today Treatment compliance: Treatment may start today if patient agrees with proposed plan. Evaluation of compliance is not applicable at this point Risk Assessment Profile: Aberrant behavior: See initial evaluations. None observed or detected today Comorbid factors increasing risk of overdose: See initial evaluation. No additional risks detected today Medical Psychology Evaluation: Please see scanned results in medical record. Opioid Risk Tool - 09/29/17 1326      Family History of Substance Abuse   Alcohol  Positive Male    Illegal Drugs  Negative    Rx Drugs  Negative      Personal History of Substance Abuse   Alcohol  Negative    Illegal Drugs  Negative    Rx Drugs  Negative      Psychological Disease   Psychological Disease  Negative    Depression  Negative      Total Score   Opioid Risk Tool Scoring  3    Opioid Risk Interpretation   Low Risk      ORT Scoring interpretation table:  Score <3 = Low Risk for SUD  Score between 4-7 = Moderate Risk for SUD  Score >8 = High Risk for Opioid Abuse   Risk Mitigation Strategies:  Patient opioid safety counseling: Completed today. Counseling provided to patient as per "Patient Counseling Document". Document signed by patient, attesting to counseling and understanding Patient-Prescriber Agreement (PPA): Obtained today.  Controlled substance notification to other providers: Written and sent today.  Pharmacologic Plan: Today we may be taking over the patient's pharmacological regimen. See below.             Laboratory Chemistry  Inflammation Markers (CRP: Acute Phase) (ESR: Chronic Phase) Lab Results  Component  Value Date   CRP 1.0 09/29/2017   ESRSEDRATE 7 09/29/2017                         Renal Function Markers Lab Results  Component Value Date   BUN 14 09/29/2017   CREATININE 0.91 09/29/2017   GFRAA 108 09/29/2017   GFRNONAA 93 09/29/2017                              Hepatic Function Markers Lab Results  Component Value Date   AST 16 09/29/2017   ALBUMIN 4.8 09/29/2017   ALKPHOS 67 09/29/2017                        Electrolytes Lab Results  Component Value Date   NA 144 09/29/2017   K 4.1 09/29/2017   CL 102 09/29/2017   CALCIUM 9.8 09/29/2017   MG 1.9 09/29/2017                        Neuropathy Markers Lab Results  Component Value Date   VITAMINB12 606 09/29/2017                        Bone Pathology Markers Lab Results  Component Value Date   25OHVITD1 27 (L) 09/29/2017   25OHVITD2 <1.0 09/29/2017   25OHVITD3 27 09/29/2017                         Note: Lab results reviewed.  Recent Diagnostic Imaging Review   Complexity Note: No results found under the [facility] electronic medical record.                         Meds   Current Outpatient Medications:  .  acetaminophen (TYLENOL) 325 MG tablet, Take 650 mg by mouth every 6  (six) hours as needed., Disp: , Rfl:  .  Aspirin-Salicylamide-Caffeine (ARTHRITIS STRENGTH BC POWDER PO), Take by mouth., Disp: , Rfl:  .  atorvastatin (LIPITOR) 40 MG tablet, Take 40 mg by mouth daily., Disp: , Rfl:  .  Calcium Carbonate-Vit D-Min (GNP CALCIUM 1200) 1200-1000 MG-UNIT CHEW, Chew 1,200 mg by mouth daily with breakfast. Take in combination with vitamin D and magnesium., Disp: 30 tablet, Rfl: 5 .  Cholecalciferol (VITAMIN D3) 5000 units CAPS, Take 1 capsule (5,000 Units total) by mouth daily with breakfast. Take along with calcium and magnesium., Disp: 30 capsule, Rfl: 5 .  cyanocobalamin ([employer]S VITAMIN B12) 2000 MCG tablet, Take 1 tablet (2,000 mcg total) by mouth daily., Disp: 90 tablet, Rfl: 0 .  diclofenac sodium (VOLTAREN) 1 % GEL, Apply topically 4 (four) times daily., Disp: , Rfl:  .  ergocalciferol (VITAMIN D2) 50000 units capsule, Take 1 capsule (50,000 Units total) by mouth 2 (two) times a week. X 6 weeks., Disp: 12 capsule, Rfl: 0 .  hydrochlorothiazide (HYDRODIURIL) 25 MG tablet, Take 25 mg by mouth daily., Disp: , Rfl:  .  Magnesium 500 MG CAPS, Take 1 capsule (500 mg total) by mouth 2 (two) times daily at 8 am and 10 pm., Disp: 60 capsule, Rfl: 5 .  Multiple Vitamins-Minerals (ONE-A-DAY 50 PLUS PO), Take by mouth., Disp: , Rfl:  .  omeprazole (PRILOSEC) 20 MG capsule, Take 20 mg by mouth daily., Disp: , Rfl:  .  varenicline (CHANTIX) 1 MG tablet, Take 1 mg by mouth 2 (two) times daily., Disp: , Rfl:  .  verapamil (VERELAN PM) 240 MG 24 hr capsule, Take 240 mg by mouth at bedtime., Disp: , Rfl:   ROS  Constitutional: Denies any fever or chills Gastrointestinal: No reported hemesis, hematochezia, vomiting, or acute GI distress Musculoskeletal: Denies any acute onset joint swelling, redness, loss of ROM, or weakness Neurological: No reported episodes of acute onset apraxia, aphasia, dysarthria, agnosia, amnesia, paralysis, loss of coordination, or loss of  consciousness  Allergies  Mr. Owczarzak has No Known Allergies.  PFSH  Drug: Mr. Maya  has no drug history on file. Alcohol:  reports that he drank alcohol. Tobacco:  reports that he has been smoking.  He has a 80.00 pack-year smoking history. He has never used smokeless tobacco. Medical:  has a past medical history of Arthritis, Hyperlipidemia, Hypertension, Joint ache, and Reflux esophagitis. Surgical: Mr. Telford  has no past surgical history on file. Family: family history includes Heart disease in his mother; Hypertension in his mother.  Constitutional Exam  General appearance: Well nourished, well developed, and well hydrated. In no apparent acute distress Vitals:   10/12/17 0803  BP: 125/75  Pulse: 81  Resp: 18  Temp: 97.7 F (36.5 C)  TempSrc: Oral  SpO2: 97%  Weight: 290 lb (131.5 kg)  Height: 6' (1.829 m)  HC: 2" (5.1 cm)   BMI Assessment: Estimated body mass index is 39.33 kg/m as calculated from the following:   Height as of this encounter: 6' (1.829 m).   Weight as of this encounter: 290 lb (131.5 kg).  BMI interpretation table: BMI level Category Range association with higher incidence of chronic pain  <18 kg/m2 Underweight   18.5-24.9 kg/m2 Ideal body weight   25-29.9 kg/m2 Overweight Increased incidence by 20%  30-34.9 kg/m2 Obese (Class I) Increased incidence by 68%  35-39.9 kg/m2 Severe obesity (Class II) Increased incidence by 136%  >40 kg/m2 Extreme obesity (Class III) Increased incidence by 254%   BMI Readings from Last 4 Encounters:  10/12/17 39.33 kg/m  09/29/17 38.52 kg/m   Wt Readings from Last 4 Encounters:  10/12/17 290 lb (131.5 kg)  09/29/17 300 lb (136.1 kg)  Psych/Mental status: Alert, oriented x 3 (person, place, & time)       Eyes: PERLA Respiratory: No evidence of acute respiratory distress  Cervical Spine Area Exam  Skin & Axial Inspection: No masses, redness, edema, swelling, or associated skin lesions Alignment:  Symmetrical Functional ROM: Unrestricted ROM      Stability: No instability detected Muscle Tone/Strength: Functionally intact. No obvious neuro-muscular anomalies detected. Sensory (Neurological): Unimpaired Palpation: No palpable anomalies              Upper Extremity (UE) Exam    Side: Right upper extremity  Side: Left upper extremity  Skin & Extremity Inspection: Skin color, temperature, and hair growth are WNL. No peripheral edema or cyanosis. No masses, redness, swelling, asymmetry, or associated skin lesions. No contractures.  Skin & Extremity Inspection: Skin color, temperature, and hair growth are WNL. No peripheral edema or cyanosis. No masses, redness, swelling, asymmetry, or associated skin lesions. No contractures.  Functional ROM: Unrestricted ROM          Functional ROM: Unrestricted ROM          Muscle Tone/Strength: Functionally intact. No obvious neuro-muscular anomalies detected.  Muscle Tone/Strength: Functionally intact. No obvious neuro-muscular anomalies detected.  Sensory (Neurological): Unimpaired  Sensory (Neurological): Unimpaired          Palpation: No palpable anomalies              Palpation: No palpable anomalies              Specialized Test(s): Deferred         Specialized Test(s): Deferred          Thoracic Spine Area Exam  Skin & Axial Inspection: No masses, redness, or swelling Alignment: Symmetrical Functional ROM: Unrestricted ROM Stability: No instability detected Muscle Tone/Strength: Functionally intact. No obvious neuro-muscular anomalies detected. Sensory (Neurological): Unimpaired Muscle strength & Tone: No palpable anomalies  Lumbar Spine Area Exam  Skin & Axial Inspection: No masses, redness, or swelling Alignment: Symmetrical Functional ROM: Decreased ROM       Stability: No instability detected Muscle Tone/Strength: Functionally intact. No obvious neuro-muscular anomalies detected. Sensory (Neurological): Movement-associated  pain Palpation: Complains of area being tender to palpation       Provocative Tests: Lumbar Hyperextension and rotation test: Positive bilaterally for facet joint pain. Lumbar Lateral bending test: evaluation deferred today       Patrick's Maneuver: Positive             for bilateral hip arthralgia  Gait & Posture Assessment  Ambulation: Limited Gait: Antalgic Posture: Difficulty standing up straight, due to pain   Lower Extremity Exam    Side: Right lower extremity  Side: Left lower extremity  Skin & Extremity Inspection: Skin color, temperature, and hair growth are WNL. No peripheral edema or cyanosis. No masses, redness, swelling, asymmetry, or associated skin lesions. No contractures.  Skin & Extremity Inspection: Skin color, temperature, and hair growth are WNL. No peripheral edema or cyanosis. No masses, redness, swelling, asymmetry, or associated skin lesions. No contractures.  Functional ROM: Unrestricted ROM          Functional ROM: Unrestricted ROM          Muscle Tone/Strength: Functionally intact. No obvious neuro-muscular anomalies detected.  Muscle Tone/Strength: Functionally intact. No obvious neuro-muscular anomalies detected.  Sensory (Neurological): Unimpaired  Sensory (Neurological): Unimpaired  Palpation: No palpable anomalies  Palpation: No palpable anomalies   Assessment & Plan  Primary Diagnosis & Pertinent Problem List: The primary encounter diagnosis was Chronic hip pain (Primary Area of Pain) (Bilateral) (L>R). 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  Visit Diagnosis: 1. Chronic hip pain (Primary Area of Pain) (Bilateral) (L>R)   2. 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 Lumbar facet syndrome (Bilateral) (L>R)   5. Chronic lower extremity pain (Tertiary Area of Pain) (Bilateral) (L>R)   6. Chronic knee pain (Fourth Area of Pain) (Bilateral) (L>R)   7. Chronic pain syndrome   8. Disorder of skeletal system   9. Pharmacologic therapy   10. Problems influencing health status   11. Vitamin D insufficiency    Problems updated and reviewed during this visit: Problem  Osteoarthritis of hip (Bilateral) (L>R)  Lumbar facet syndrome (Bilateral) (L>R)  Cluster Headaches   RIGHT/HEMICRANIA   Chronic low back  pain (Secondary Area of Pain) (Bilateral) with sciatica (Left)  Chronic lower extremity pain (Tertiary Area of Pain) (Bilateral) (L>R)  Chronic hip pain (Primary Area of Pain) (Bilateral) (L>R)  Chronic knee pain (Fourth Area of Pain) (Bilateral) (L>R)  Chronic Pain Syndrome  Variants of Migraine, Not Elsewhere Classified, With Intractable Migraine, So Stated, Without Mention of Status Migrainosus  Vitamin D Insufficiency  Chronic Insomnia  Sleep Apnea   SEVERE OSA   Smoker  Pharmacologic Therapy  Disorder of Skeletal System  Problems Influencing Health Status  High Risk Medication Use   HCTZ, statin, BC's   Gastritis and Duodenitis  Anxiety  Copd (Chronic Obstructive Pulmonary Disease) ([facility])  Esophageal Varices ([facility])   By 02/2001 EGD   Essential Hypertension  Stricture of Esophagus  Type 2 Diabetes Mellitus Without Complication ([facility])  Hyperlipidemia, Unspecified    Plan of Care  Pharmacotherapy (Medications Ordered): Meds ordered this encounter  Medications  . ergocalciferol (VITAMIN D2) 50000 units capsule    Sig: Take 1 capsule (50,000 Units total) by mouth 2 (two) times a week. X 6 weeks.    Dispense:  12  capsule    Refill:  0    Do not add this medication to the electronic "Automatic Refill" notification system. Patient may have prescription filled one day early if pharmacy is closed on scheduled refill date.  . Cholecalciferol (VITAMIN D3) 5000 units CAPS    Sig: Take 1 capsule (5,000 Units total) by mouth daily with breakfast. Take along with calcium and magnesium.    Dispense:  30 capsule    Refill:  5    Do not place medication on "Automatic Refill".  May substitute with similar over-the-counter product.  . Magnesium 500 MG CAPS    Sig: Take 1 capsule (500 mg total) by mouth 2 (two) times daily at 8 am and 10 pm.    Dispense:  60 capsule    Refill:  5    Do not place medication on "Automatic Refill".  The patient may use similar over-the-counter product.  . Calcium Carbonate-Vit D-Min (GNP CALCIUM 1200) 1200-1000 MG-UNIT CHEW    Sig: Chew 1,200 mg by mouth daily with breakfast. Take in combination with vitamin D and magnesium.    Dispense:  30 tablet    Refill:  5    Do not place medication on "Automatic Refill".  May substitute with similar over-the-counter product.  . cyanocobalamin (CVS VITAMIN B12) 2000 MCG tablet    Sig: Take 1 tablet (2,000 mcg total) by mouth daily.    Dispense:  90 tablet    Refill:  0    Do not place medication on "Automatic Refill". Fill one day early if pharmacy is closed on scheduled refill date.    Procedure Orders     HIP INJECTION Lab Orders  No laboratory test(s) ordered today    Imaging Orders     DG Lumbar Spine Complete W/Bend     DG HIP UNILAT W OR W/O PELVIS 2-3 VIEWS RIGHT     DG HIP UNILAT W OR W/O PELVIS 2-3 VIEWS LEFT     DG Knee 1-2 Views Right     DG Knee 1-2 Views Left Referral Orders  No referral(s) requested today    Pharmacological management options:  Opioid Analgesics: We'll take over management today. See above orders Membrane stabilizer: We have discussed the possibility of optimizing this mode of therapy, if  tolerated Muscle relaxant: We have discussed the possibility of a trial NSAID: We have  discussed the possibility of a trial Other analgesic(s): To be determined at a later time   Interventional management options: Planned, scheduled, and/or pending:    Diagnostic bilateral intra-articular hip joint injection #1 under fluoroscopic guidance, no sedation   Considering:   Diagnostic bilateral lumbar facet nerve block Possible bilateral bilateral lumbar facet RFA Diagnostic left sided LESI Diagnostic left intra-articular hip injection Diagnostic left intra-articular knee injection bilateral Hyalgan series Diagnostic left genicular nerve block Possible left genicular nerve artifact   PRN Procedures:   None at this time   Provider-requested follow-up: Return for Procedure (no sedation): (B) IA Hip Inj..  Future Appointments  Date Time Provider [place]  10/27/2017  7:45 AM Milinda Pointer, MD [facility] None    Primary Care Physician: [facility], [place] Location: [facility] Outpatient Pain Management Facility Note by: Gaspar Cola, MD Date: 10/12/2017; Time: 9:10 AM

## 2017-10-12 NOTE — Patient Instructions (Addendum)
____________________________________________________________________________________________  Pain Scale  Introduction: The pain score used by this practice is the Verbal Numerical Rating Scale (VNRS-11). This is an 11-point scale. It is for adults and children 10 years or older. There are significant differences in how the pain score is reported, used, and applied. Forget everything you learned in the past and learn this scoring system.  General Information: The scale should reflect your current level of pain. Unless you are specifically asked for the level of your worst pain, or your average pain. If you are asked for one of these two, then it should be understood that it is over the past 24 hours.  Basic Activities of Daily Living (ADL): Personal hygiene, dressing, eating, transferring, and using restroom.  Instructions: Most patients tend to report their level of pain as a combination of two factors, their physical pain and their psychosocial pain. This last one is also known as "suffering" and it is reflection of how physical pain affects you socially and psychologically. From now on, report them separately. From this point on, when asked to report your pain level, report only your physical pain. Use the following table for reference.  Pain Clinic Pain Levels (0-5/10)  Pain Level Score  Description  No Pain 0   Mild pain 1 Nagging, annoying, but does not interfere with basic activities of daily living (ADL). Patients are able to eat, bathe, get dressed, toileting (being able to get on and off the toilet and perform personal hygiene functions), transfer (move in and out of bed or a chair without assistance), and maintain continence (able to control bladder and bowel functions). Blood pressure and heart rate are unaffected. A normal heart rate for a healthy adult ranges from 60 to 100 bpm (beats per minute).   Mild to moderate pain 2 Noticeable and distracting. Impossible to hide from other  people. More frequent flare-ups. Still possible to adapt and function close to normal. It can be very annoying and may have occasional stronger flare-ups. With discipline, patients may get used to it and adapt.   Moderate pain 3 Interferes significantly with activities of daily living (ADL). It becomes difficult to feed, bathe, get dressed, get on and off the toilet or to perform personal hygiene functions. Difficult to get in and out of bed or a chair without assistance. Very distracting. With effort, it can be ignored when deeply involved in activities.   Moderately severe pain 4 Impossible to ignore for more than a few minutes. With effort, patients may still be able to manage work or participate in some social activities. Very difficult to concentrate. Signs of autonomic nervous system discharge are evident: dilated pupils (mydriasis); mild sweating (diaphoresis); sleep interference. Heart rate becomes elevated (>115 bpm). Diastolic blood pressure (lower number) rises above 100 mmHg. Patients find relief in laying down and not moving.   Severe pain 5 Intense and extremely unpleasant. Associated with frowning face and frequent crying. Pain overwhelms the senses.  Ability to do any activity or maintain social relationships becomes significantly limited. Conversation becomes difficult. Pacing back and forth is common, as getting into a comfortable position is nearly impossible. Pain wakes you up from deep sleep. Physical signs will be obvious: pupillary dilation; increased sweating; goosebumps; brisk reflexes; cold, clammy hands and feet; nausea, vomiting or dry heaves; loss of appetite; significant sleep disturbance with inability to fall asleep or to remain asleep. When persistent, significant weight loss is observed due to the complete loss of appetite and sleep deprivation.  Blood   pressure and heart rate becomes significantly elevated. Caution: If elevated blood pressure triggers a pounding headache  associated with blurred vision, then the patient should immediately seek attention at an urgent or emergency care unit, as these may be signs of an impending stroke.    Emergency Department Pain Levels (6-10/10)  Emergency Room Pain 6 Severely limiting. Requires emergency care and should not be seen or managed at an outpatient pain management facility. Communication becomes difficult and requires great effort. Assistance to reach the emergency department may be required. Facial flushing and profuse sweating along with potentially dangerous increases in heart rate and blood pressure will be evident.   Distressing pain 7 Self-care is very difficult. Assistance is required to transport, or use restroom. Assistance to reach the emergency department will be required. Tasks requiring coordination, such as bathing and getting dressed become very difficult.   Disabling pain 8 Self-care is no longer possible. At this level, pain is disabling. The individual is unable to do even the most "basic" activities such as walking, eating, bathing, dressing, transferring to a bed, or toileting. Fine motor skills are lost. It is difficult to think clearly.   Incapacitating pain 9 Pain becomes incapacitating. Thought processing is no longer possible. Difficult to remember your own name. Control of movement and coordination are lost.   The worst pain imaginable 10 At this level, most patients pass out from pain. When this level is reached, collapse of the autonomic nervous system occurs, leading to a sudden drop in blood pressure and heart rate. This in turn results in a temporary and dramatic drop in blood flow to the brain, leading to a loss of consciousness. Fainting is one of the body's self defense mechanisms. Passing out puts the brain in a calmed state and causes it to shut down for a while, in order to begin the healing process.    Summary: 1. Refer to this scale when providing us with your pain level. 2. Be  accurate and careful when reporting your pain level. This will help with your care. 3. Over-reporting your pain level will lead to loss of credibility. 4. Even a level of 1/10 means that there is pain and will be treated at our facility. 5. High, inaccurate reporting will be documented as "Symptom Exaggeration", leading to loss of credibility and suspicions of possible secondary gains such as obtaining more narcotics, or wanting to appear disabled, for fraudulent reasons. 6. Only pain levels of 5 or below will be seen at our facility. 7. Pain levels of 6 and above will be sent to the Emergency Department and the appointment cancelled. ____________________________________________________________________________________________   ____________________________________________________________________________________________  Preparing for your procedure (without sedation)  Instructions: . Oral Intake: Do not eat or drink anything for at least 3 hours prior to your procedure. . Transportation: Unless otherwise stated by your physician, you may drive yourself after the procedure. . Blood Pressure Medicine: Take your blood pressure medicine with a sip of water the morning of the procedure. . Blood thinners:  . Diabetics on insulin: Notify the staff so that you can be scheduled 1st case in the morning. If your diabetes requires high dose insulin, take only  of your normal insulin dose the morning of the procedure and notify the staff that you have done so. . Preventing infections: Shower with an antibacterial soap the morning of your procedure.  . Build-up your immune system: Take 1000 mg of Vitamin C with every meal (3 times a day) the day prior to   your procedure. . Antibiotics: Inform the staff if you have a condition or reason that requires you to take antibiotics before dental procedures. . Pregnancy: If you are pregnant, call and cancel the procedure. . Sickness: If you have a cold, fever, or any  active infections, call and cancel the procedure. . Arrival: You must be in the facility at least 30 minutes prior to your scheduled procedure. . Children: Do not bring any children with you. . Dress appropriately: Bring dark clothing that you would not mind if they get stained. . Valuables: Do not bring any jewelry or valuables.  Procedure appointments are reserved for interventional treatments only. . No Prescription Refills. . No medication changes will be discussed during procedure appointments. . No disability issues will be discussed.  Remember:  Regular Business hours are:  Monday to Thursday 8:00 AM to 4:00 PM  Provider's Schedule: Amandeep Nesmith, MD:  Procedure days: Tuesday and Thursday 7:30 AM to 4:00 PM  Bilal Lateef, MD:  Procedure days: Monday and Wednesday 7:30 AM to 4:00 PM ____________________________________________________________________________________________    

## 2017-10-12 NOTE — Progress Notes (Signed)
Safety precautions to be maintained throughout the outpatient stay will include: orient to surroundings, keep bed in low position, maintain call bell within reach at all times, provide assistance with transfer out of bed and ambulation.  

## 2017-10-27 ENCOUNTER — Ambulatory Visit: Payer: Medicaid Other | Admitting: Pain Medicine

## 2017-10-27 ENCOUNTER — Other Ambulatory Visit: Payer: Self-pay

## 2017-10-27 ENCOUNTER — Encounter: Payer: Self-pay | Admitting: Pain Medicine

## 2017-10-27 ENCOUNTER — Ambulatory Visit
Admission: RE | Admit: 2017-10-27 | Discharge: 2017-10-27 | Disposition: A | Discharge status: Home / Self Care | Payer: Medicaid Other | Source: Ambulatory Visit | Attending: Pain Medicine | Admitting: Pain Medicine

## 2017-10-27 VITALS — BP 115/65 | HR 70 | Temp 97.8°F | Resp 20 | Ht 74.0 in | Wt 290.0 lb

## 2017-10-27 DIAGNOSIS — M160 Bilateral primary osteoarthritis of hip: Secondary | ICD-10-CM | POA: Diagnosis not present

## 2017-10-27 DIAGNOSIS — M25551 Pain in right hip: Secondary | ICD-10-CM

## 2017-10-27 DIAGNOSIS — G8929 Other chronic pain: Secondary | ICD-10-CM | POA: Diagnosis not present

## 2017-10-27 DIAGNOSIS — Z79899 Other long term (current) drug therapy: Secondary | ICD-10-CM | POA: Diagnosis not present

## 2017-10-27 DIAGNOSIS — M25552 Pain in left hip: Secondary | ICD-10-CM

## 2017-10-27 DIAGNOSIS — M166 Other bilateral secondary osteoarthritis of hip: Secondary | ICD-10-CM

## 2017-10-27 DIAGNOSIS — Z888 Allergy status to other drugs, medicaments and biological substances status: Secondary | ICD-10-CM | POA: Insufficient documentation

## 2017-10-27 MED ORDER — IOPAMIDOL (ISOVUE-M 200) INJECTION 41%
10.0000 mL | Freq: Once | INTRAMUSCULAR | Status: AC
  Administered 2017-10-27: 10 mL via EPIDURAL
  Filled 2017-10-27: qty 10

## 2017-10-27 MED ORDER — LIDOCAINE HCL 2 % IJ SOLN
20.0000 mL | Freq: Once | INTRAMUSCULAR | Status: AC
  Administered 2017-10-27: 400 mg
  Filled 2017-10-27: qty 40

## 2017-10-27 MED ORDER — ROPIVACAINE HCL 2 MG/ML IJ SOLN
9.0000 mL | Freq: Once | INTRAMUSCULAR | Status: AC
  Administered 2017-10-27: 9 mL via INTRA_ARTICULAR
  Filled 2017-10-27: qty 10

## 2017-10-27 MED ORDER — METHYLPREDNISOLONE ACETATE 80 MG/ML IJ SUSP
80.0000 mg | Freq: Once | INTRAMUSCULAR | Status: AC
  Administered 2017-10-27: 80 mg via INTRA_ARTICULAR
  Filled 2017-10-27: qty 1

## 2017-10-27 NOTE — Progress Notes (Signed)
Safety precautions to be maintained throughout the outpatient stay will include: orient to surroundings, keep bed in low position, maintain call bell within reach at all times, provide assistance with transfer out of bed and ambulation.  

## 2017-10-27 NOTE — Progress Notes (Signed)
Patient's Name: Johnny Williams  MRN: [id number]  Referring Provider: [facility], [place]: Apr 23, 1961  PCP: [facility], [place]: 10/27/2017  Note by: Gaspar Cola, MD  Service setting: Ambulatory outpatient  Specialty: Interventional Pain Management  Patient type: Established  Location: [facility] (AMB) Pain Management Facility  Visit type: Interventional Procedure   Primary Reason for Visit: Interventional Pain Management Treatment. CC: Hip Pain (bilateral)  Procedure:       Anesthesia, Analgesia, Anxiolysis:  Type: Intra-Articular Hip Injection #1  Primary Purpose: Diagnostic Region: Posterolateral hip joint area. Level: Lower pelvic and hip joint level. Target Area: Superior aspect of the hip joint cavity, going thru the superior portion of the capsular ligament. Approach: Posterolateral approach. Laterality: Bilateral Position: Prone  Type: Local Anesthesia Indication(s): Analgesia         Route: Infiltration ([place]/IM) IV Access: Declined Sedation: Declined  Local Anesthetic: Lidocaine 1-2%   Indications: 1. 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 Chronic hip pain (Primary Area of Pain) (Bilateral) (L>R)    Pain Score: Pre-procedure: 5 /10 Post-procedure: 0-No pain/10  Pre-op Assessment:  [name] is a 57 y.o. (year old), male patient, seen today for interventional treatment. He  has no past surgical history on file. [name] has a current medication list which includes the following prescription(s): acetaminophen, aspirin-salicylamide-caffeine, atorvastatin, gnp calcium 1200, vitamin d3, cyanocobalamin, diclofenac sodium, ergocalciferol, hydrochlorothiazide, magnesium, multiple vitamins-minerals, omeprazole, varenicline, and verapamil. His primarily concern today is the Hip Pain (bilateral)  Initial Vital Signs:  Pulse/HCG Rate: 70ECG Heart Rate: 74 Temp: 97.8 F (36.6 C) Resp: 16 BP: 129/74 SpO2: 96 %  BMI: Estimated body mass index is 37.23 kg/m  as calculated from the following:   Height as of this encounter: 6\' 2"  (1.88 m).   Weight as of this encounter: 290 lb (131.5 kg).  Risk Assessment: Allergies: Reviewed. He is allergic to fluoxetine.  Allergy Precautions: None required Coagulopathies: Reviewed. None identified.  Blood-thinner therapy: None at this time Active Infection(s): Reviewed. None identified. [name] is afebrile  Site Confirmation: [name] was asked to confirm the procedure and laterality before marking the site Procedure checklist: Completed Consent: Before the procedure and under the influence of no sedative(s), amnesic(s), or anxiolytics, the patient was informed of the treatment options, risks and possible complications. To fulfill our ethical and legal obligations, as recommended by the American Medical Association's Code of Ethics, I have informed the patient of my clinical impression; the nature and purpose of the treatment or procedure; the risks, benefits, and possible complications of the intervention; the alternatives, including doing nothing; the risk(s) and benefit(s) of the alternative treatment(s) or procedure(s); and the risk(s) and benefit(s) of doing nothing. The patient was provided information about the general risks and possible complications associated with the procedure. These may include, but are not limited to: failure to achieve desired goals, infection, bleeding, organ or nerve damage, allergic reactions, paralysis, and death. In addition, the patient was informed of those risks and complications associated to the procedure, such as failure to decrease pain; infection; bleeding; organ or nerve damage with subsequent damage to sensory, motor, and/or autonomic systems, resulting in permanent pain, numbness, and/or weakness of one or several areas of the body; allergic reactions; (i.e.: anaphylactic reaction); and/or death. Furthermore, the patient was informed of those risks and complications  associated with the medications. These include, but are not limited to: allergic reactions (i.e.: anaphylactic or anaphylactoid reaction(s)); adrenal axis suppression; blood sugar elevation that  in diabetics may result in ketoacidosis or comma; water retention that in patients with history of congestive heart failure may result in shortness of breath, pulmonary edema, and decompensation with resultant heart failure; weight gain; swelling or edema; medication-induced neural toxicity; particulate matter embolism and blood vessel occlusion with resultant organ, and/or nervous system infarction; and/or aseptic necrosis of one or more joints. Finally, the patient was informed that Medicine is not an exact science; therefore, there is also the possibility of unforeseen or unpredictable risks and/or possible complications that may result in a catastrophic outcome. The patient indicated having understood very clearly. We have given the patient no guarantees and we have made no promises. Enough time was given to the patient to ask questions, all of which were answered to the patient's satisfaction. [name] has indicated that he wanted to continue with the procedure. Attestation: I, the ordering provider, attest that I have discussed with the patient the benefits, risks, side-effects, alternatives, likelihood of achieving goals, and potential problems during recovery for the procedure that I have provided informed consent. Date  Time: 10/27/2017  8:05 AM  Pre-Procedure Preparation:  Monitoring: As per clinic protocol. Respiration, ETCO2, SpO2, BP, heart rate and rhythm monitor placed and checked for adequate function Safety Precautions: Patient was assessed for positional comfort and pressure points before starting the procedure. Time-out: I initiated and conducted the "Time-out" before starting the procedure, as per protocol. The patient was asked to participate by confirming the accuracy of the "Time Out"  information. Verification of the correct person, site, and procedure were performed and confirmed by me, the nursing staff, and the patient. "Time-out" conducted as per Joint Commission's Universal Protocol (UP.01.01.01). Time: 6301  Description of Procedure Process:   Area Prepped: Entire Posterolateral hip area. Prepping solution: ChloraPrep (2% chlorhexidine gluconate and 70% isopropyl alcohol) Safety Precautions: Aspiration looking for blood return was conducted prior to all injections. At no point did we inject any substances, as a needle was being advanced. No attempts were made at seeking any paresthesias. Safe injection practices and needle disposal techniques used. Medications properly checked for expiration dates. SDV (single dose vial) medications used. Description of the Procedure: Protocol guidelines were followed. The patient was placed in position over the fluoroscopy table. The target area was identified and the area prepped in the usual manner. Skin & deeper tissues infiltrated with local anesthetic. Appropriate amount of time allowed to pass for local anesthetics to take effect. The procedure needles were then advanced to the target area. Proper needle placement secured. Negative aspiration confirmed. Solution injected in intermittent fashion, asking for systemic symptoms every 0.5cc of injectate. The needles were then removed and the area cleansed, making sure to leave some of the prepping solution back to take advantage of its long term bactericidal properties. Vitals:   10/27/17 0836 10/27/17 0842 10/27/17 0847 10/27/17 0850  BP: 114/63 116/64 (!) 109/58 115/65  Pulse:      Resp: (!) 21 (!) 21 20 20   Temp:      SpO2: 94% 94% 92% 95%  Weight:      Height:        Start Time: 0836 hrs. End Time: 0849 hrs. Materials:  Needle(s) Type: Regular needle Gauge: 22G Length: 5-in Medication(s): Please see orders for medications and dosing details.  Imaging Guidance (Non-Spinal):   Type of Imaging Technique: Fluoroscopy Guidance (Non-Spinal) Indication(s): Assistance in needle guidance and placement for procedures requiring needle placement in or near specific anatomical locations not easily accessible without such  assistance. Exposure Time: Please see nurses notes. Contrast: Before injecting any contrast, we confirmed that the patient did not have an allergy to iodine, shellfish, or radiological contrast. Once satisfactory needle placement was completed at the desired level, radiological contrast was injected. Contrast injected under live fluoroscopy. No contrast complications. See chart for type and volume of contrast used. Fluoroscopic Guidance: I was personally present during the use of fluoroscopy. "Tunnel Vision Technique" used to obtain the best possible view of the target area. Parallax error corrected before commencing the procedure. "Direction-depth-direction" technique used to introduce the needle under continuous pulsed fluoroscopy. Once target was reached, antero-posterior, oblique, and lateral fluoroscopic projection used confirm needle placement in all planes. Images permanently stored in EMR. Interpretation: I personally interpreted the imaging intraoperatively. Adequate needle placement confirmed in multiple planes. Appropriate spread of contrast into desired area was observed. No evidence of afferent or efferent intravascular uptake. Permanent images saved into the patient's record.  Antibiotic Prophylaxis:   Anti-infectives (From admission, onward)   None     Indication(s): None identified  Post-operative Assessment:  Post-procedure Vital Signs:  Pulse/HCG Rate: 7075 Temp: 97.8 F (36.6 C) Resp: 20 BP: 115/65 SpO2: 95 %  EBL: None  Complications: No immediate post-treatment complications observed by team, or reported by patient.  Note: The patient tolerated the entire procedure well. A repeat set of vitals were taken after the procedure and the  patient was kept under observation following institutional policy, for this type of procedure. Post-procedural neurological assessment was performed, showing return to baseline, prior to discharge. The patient was provided with post-procedure discharge instructions, including a section on how to identify potential problems. Should any problems arise concerning this procedure, the patient was given instructions to immediately contact us, at any time, without hesitation. In any case, we plan to contact the patient by telephone for a follow-up status report regarding this interventional procedure.  Comments:  No additional relevant information.  Plan of Care    Imaging Orders     DG C-Arm 1-60 Min-No Report  Procedure Orders     HIP INJECTION  Medications ordered for procedure: Meds ordered this encounter  Medications  . iopamidol (ISOVUE-M) 41 % intrathecal injection 10 mL    Must be Myelogram-compatible. If not available, you may substitute with a water-soluble, non-ionic, hypoallergenic, myelogram-compatible radiological contrast medium.  Marland Kitchen lidocaine (XYLOCAINE) 2 % (with pres) injection 400 mg  . methylPREDNISolone acetate (DEPO-MEDROL) injection 80 mg  . ropivacaine (PF) 2 mg/mL (0.2%) (NAROPIN) injection 9 mL   Medications administered: We administered iopamidol, lidocaine, methylPREDNISolone acetate, and ropivacaine (PF) 2 mg/mL (0.2%).  See the medical record for exact dosing, route, and time of administration.  New Prescriptions   No medications on file   Disposition: Discharge home  Discharge Date & Time: 10/27/2017; 0900 hrs.   Physician-requested Follow-up: Return for post-procedure eval (2 wks), w/ Dr. Dossie Arbour.  Future Appointments  Date Time Provider [place]  11/09/2017 10:15 AM Milinda Pointer, MD [facility] None   Primary Care Physician: [facility], [place] Location: [facility] Outpatient Pain Management Facility Note by: Gaspar Cola,  MD Date: 10/27/2017; Time: 11:38 AM  Disclaimer:  Medicine is not an exact science. The only guarantee in medicine is that nothing is guaranteed. It is important to note that the decision to proceed with this intervention was based on the information collected from the patient. The Data and conclusions were drawn from the patient's questionnaire, the interview, and the physical examination. Because the information  was provided in large part by the patient, it cannot be guaranteed that it has not been purposely or unconsciously manipulated. Every effort has been made to obtain as much relevant data as possible for this evaluation. It is important to note that the conclusions that lead to this procedure are derived in large part from the available data. Always take into account that the treatment will also be dependent on availability of resources and existing treatment guidelines, considered by other Pain Management Practitioners as being common knowledge and practice, at the time of the intervention. For Medico-Legal purposes, it is also important to point out that variation in procedural techniques and pharmacological choices are the acceptable norm. The indications, contraindications, technique, and results of the above procedure should only be interpreted and judged by a Board-Certified Interventional Pain Specialist with extensive familiarity and expertise in the same exact procedure and technique.

## 2017-10-27 NOTE — Patient Instructions (Addendum)

## 2017-10-28 ENCOUNTER — Telehealth: Payer: Self-pay

## 2017-10-28 NOTE — Telephone Encounter (Signed)
Pt not at home, wife states pain is better. Stated to call if needed

## 2017-11-09 ENCOUNTER — Other Ambulatory Visit: Payer: Self-pay

## 2017-11-09 ENCOUNTER — Ambulatory Visit: Payer: Medicaid Other | Attending: Pain Medicine | Admitting: Pain Medicine

## 2017-11-09 ENCOUNTER — Encounter: Payer: Self-pay | Admitting: Pain Medicine

## 2017-11-09 VITALS — BP 122/72 | HR 83 | Temp 98.3°F | Resp 22 | Ht 74.0 in | Wt 279.8 lb

## 2017-11-09 DIAGNOSIS — Z7982 Long term (current) use of aspirin: Secondary | ICD-10-CM | POA: Diagnosis not present

## 2017-11-09 DIAGNOSIS — M79605 Pain in left leg: Secondary | ICD-10-CM | POA: Diagnosis not present

## 2017-11-09 DIAGNOSIS — M5416 Radiculopathy, lumbar region: Secondary | ICD-10-CM | POA: Diagnosis not present

## 2017-11-09 DIAGNOSIS — M5136 Other intervertebral disc degeneration, lumbar region: Secondary | ICD-10-CM

## 2017-11-09 DIAGNOSIS — J449 Chronic obstructive pulmonary disease, unspecified: Secondary | ICD-10-CM | POA: Diagnosis not present

## 2017-11-09 DIAGNOSIS — M1610 Unilateral primary osteoarthritis, unspecified hip: Secondary | ICD-10-CM | POA: Diagnosis not present

## 2017-11-09 DIAGNOSIS — Z87891 Personal history of nicotine dependence: Secondary | ICD-10-CM | POA: Diagnosis not present

## 2017-11-09 DIAGNOSIS — Z888 Allergy status to other drugs, medicaments and biological substances status: Secondary | ICD-10-CM | POA: Insufficient documentation

## 2017-11-09 DIAGNOSIS — G8929 Other chronic pain: Secondary | ICD-10-CM | POA: Diagnosis not present

## 2017-11-09 DIAGNOSIS — F419 Anxiety disorder, unspecified: Secondary | ICD-10-CM | POA: Insufficient documentation

## 2017-11-09 DIAGNOSIS — I10 Essential (primary) hypertension: Secondary | ICD-10-CM | POA: Insufficient documentation

## 2017-11-09 DIAGNOSIS — M25551 Pain in right hip: Secondary | ICD-10-CM | POA: Diagnosis not present

## 2017-11-09 DIAGNOSIS — M549 Dorsalgia, unspecified: Secondary | ICD-10-CM | POA: Diagnosis not present

## 2017-11-09 DIAGNOSIS — M25552 Pain in left hip: Secondary | ICD-10-CM | POA: Diagnosis not present

## 2017-11-09 DIAGNOSIS — Z8249 Family history of ischemic heart disease and other diseases of the circulatory system: Secondary | ICD-10-CM | POA: Diagnosis not present

## 2017-11-09 DIAGNOSIS — M51369 Other intervertebral disc degeneration, lumbar region without mention of lumbar back pain or lower extremity pain: Secondary | ICD-10-CM

## 2017-11-09 DIAGNOSIS — M166 Other bilateral secondary osteoarthritis of hip: Secondary | ICD-10-CM

## 2017-11-09 DIAGNOSIS — K222 Esophageal obstruction: Secondary | ICD-10-CM | POA: Insufficient documentation

## 2017-11-09 DIAGNOSIS — E119 Type 2 diabetes mellitus without complications: Secondary | ICD-10-CM | POA: Insufficient documentation

## 2017-11-09 DIAGNOSIS — Z79899 Other long term (current) drug therapy: Secondary | ICD-10-CM | POA: Insufficient documentation

## 2017-11-09 DIAGNOSIS — M47816 Spondylosis without myelopathy or radiculopathy, lumbar region: Secondary | ICD-10-CM | POA: Diagnosis not present

## 2017-11-09 DIAGNOSIS — M5442 Lumbago with sciatica, left side: Secondary | ICD-10-CM | POA: Diagnosis not present

## 2017-11-09 DIAGNOSIS — M79604 Pain in right leg: Secondary | ICD-10-CM

## 2017-11-09 DIAGNOSIS — M5116 Intervertebral disc disorders with radiculopathy, lumbar region: Secondary | ICD-10-CM | POA: Diagnosis not present

## 2017-11-09 DIAGNOSIS — M25561 Pain in right knee: Secondary | ICD-10-CM | POA: Diagnosis not present

## 2017-11-09 DIAGNOSIS — M51379 Other intervertebral disc degeneration, lumbosacral region without mention of lumbar back pain or lower extremity pain: Secondary | ICD-10-CM | POA: Insufficient documentation

## 2017-11-09 DIAGNOSIS — G894 Chronic pain syndrome: Secondary | ICD-10-CM

## 2017-11-09 DIAGNOSIS — E785 Hyperlipidemia, unspecified: Secondary | ICD-10-CM | POA: Diagnosis not present

## 2017-11-09 DIAGNOSIS — K210 Gastro-esophageal reflux disease with esophagitis: Secondary | ICD-10-CM | POA: Diagnosis not present

## 2017-11-09 DIAGNOSIS — M5137 Other intervertebral disc degeneration, lumbosacral region: Secondary | ICD-10-CM | POA: Insufficient documentation

## 2017-11-09 DIAGNOSIS — M25562 Pain in left knee: Secondary | ICD-10-CM

## 2017-11-09 NOTE — Patient Instructions (Addendum)
____________________________________________________________________________________________  Pain Scale  Introduction: The pain score used by this practice is the Verbal Numerical Rating Scale (VNRS-11). This is an 11-point scale. It is for adults and children 10 years or older. There are significant differences in how the pain score is reported, used, and applied. Forget everything you learned in the past and learn this scoring system.  General Information: The scale should reflect your current level of pain. Unless you are specifically asked for the level of your worst pain, or your average pain. If you are asked for one of these two, then it should be understood that it is over the past 24 hours.  Basic Activities of Daily Living (ADL): Personal hygiene, dressing, eating, transferring, and using restroom.  Instructions: Most patients tend to report their level of pain as a combination of two factors, their physical pain and their psychosocial pain. This last one is also known as "suffering" and it is reflection of how physical pain affects you socially and psychologically. From now on, report them separately. From this point on, when asked to report your pain level, report only your physical pain. Use the following table for reference.  Pain Clinic Pain Levels (0-5/10)  Pain Level Score  Description  No Pain 0   Mild pain 1 Nagging, annoying, but does not interfere with basic activities of daily living (ADL). Patients are able to eat, bathe, get dressed, toileting (being able to get on and off the toilet and perform personal hygiene functions), transfer (move in and out of bed or a chair without assistance), and maintain continence (able to control bladder and bowel functions). Blood pressure and heart rate are unaffected. A normal heart rate for a healthy adult ranges from 60 to 100 bpm (beats per minute).   Mild to moderate pain 2 Noticeable and distracting. Impossible to hide from other  people. More frequent flare-ups. Still possible to adapt and function close to normal. It can be very annoying and may have occasional stronger flare-ups. With discipline, patients may get used to it and adapt.   Moderate pain 3 Interferes significantly with activities of daily living (ADL). It becomes difficult to feed, bathe, get dressed, get on and off the toilet or to perform personal hygiene functions. Difficult to get in and out of bed or a chair without assistance. Very distracting. With effort, it can be ignored when deeply involved in activities.   Moderately severe pain 4 Impossible to ignore for more than a few minutes. With effort, patients may still be able to manage work or participate in some social activities. Very difficult to concentrate. Signs of autonomic nervous system discharge are evident: dilated pupils (mydriasis); mild sweating (diaphoresis); sleep interference. Heart rate becomes elevated (>115 bpm). Diastolic blood pressure (lower number) rises above 100 mmHg. Patients find relief in laying down and not moving.   Severe pain 5 Intense and extremely unpleasant. Associated with frowning face and frequent crying. Pain overwhelms the senses.  Ability to do any activity or maintain social relationships becomes significantly limited. Conversation becomes difficult. Pacing back and forth is common, as getting into a comfortable position is nearly impossible. Pain wakes you up from deep sleep. Physical signs will be obvious: pupillary dilation; increased sweating; goosebumps; brisk reflexes; cold, clammy hands and feet; nausea, vomiting or dry heaves; loss of appetite; significant sleep disturbance with inability to fall asleep or to remain asleep. When persistent, significant weight loss is observed due to the complete loss of appetite and sleep deprivation.  Blood   pressure and heart rate becomes significantly elevated. Caution: If elevated blood pressure triggers a pounding headache  associated with blurred vision, then the patient should immediately seek attention at an urgent or emergency care unit, as these may be signs of an impending stroke.    Emergency Department Pain Levels (6-10/10)  Emergency Room Pain 6 Severely limiting. Requires emergency care and should not be seen or managed at an outpatient pain management facility. Communication becomes difficult and requires great effort. Assistance to reach the emergency department may be required. Facial flushing and profuse sweating along with potentially dangerous increases in heart rate and blood pressure will be evident.   Distressing pain 7 Self-care is very difficult. Assistance is required to transport, or use restroom. Assistance to reach the emergency department will be required. Tasks requiring coordination, such as bathing and getting dressed become very difficult.   Disabling pain 8 Self-care is no longer possible. At this level, pain is disabling. The individual is unable to do even the most "basic" activities such as walking, eating, bathing, dressing, transferring to a bed, or toileting. Fine motor skills are lost. It is difficult to think clearly.   Incapacitating pain 9 Pain becomes incapacitating. Thought processing is no longer possible. Difficult to remember your own name. Control of movement and coordination are lost.   The worst pain imaginable 10 At this level, most patients pass out from pain. When this level is reached, collapse of the autonomic nervous system occurs, leading to a sudden drop in blood pressure and heart rate. This in turn results in a temporary and dramatic drop in blood flow to the brain, leading to a loss of consciousness. Fainting is one of the body's self defense mechanisms. Passing out puts the brain in a calmed state and causes it to shut down for a while, in order to begin the healing process.    Summary: 1. Refer to this scale when providing us with your pain level. 2. Be  accurate and careful when reporting your pain level. This will help with your care. 3. Over-reporting your pain level will lead to loss of credibility. 4. Even a level of 1/10 means that there is pain and will be treated at our facility. 5. High, inaccurate reporting will be documented as "Symptom Exaggeration", leading to loss of credibility and suspicions of possible secondary gains such as obtaining more narcotics, or wanting to appear disabled, for fraudulent reasons. 6. Only pain levels of 5 or below will be seen at our facility. 7. Pain levels of 6 and above will be sent to the Emergency Department and the appointment cancelled. ____________________________________________________________________________________________   ____________________________________________________________________________________________  Preparing for Procedure with Sedation  Instructions: . Oral Intake: Do not eat or drink anything for at least 8 hours prior to your procedure. . Transportation: Public transportation is not allowed. Bring an adult driver. The driver must be physically present in our waiting room before any procedure can be started. . Physical Assistance: Bring an adult physically capable of assisting you, in the event you need help. This adult should keep you company at home for at least 6 hours after the procedure. . Blood Pressure Medicine: Take your blood pressure medicine with a sip of water the morning of the procedure. . Blood thinners:  . Diabetics on insulin: Notify the staff so that you can be scheduled 1st case in the morning. If your diabetes requires high dose insulin, take only  of your normal insulin dose the morning of the procedure and   notify the staff that you have done so. . Preventing infections: Shower with an antibacterial soap the morning of your procedure. . Build-up your immune system: Take 1000 mg of Vitamin C with every meal (3 times a day) the day prior to your  procedure. Marland Kitchen Antibiotics: Inform the staff if you have a condition or reason that requires you to take antibiotics before dental procedures. . Pregnancy: If you are pregnant, call and cancel the procedure. . Sickness: If you have a cold, fever, or any active infections, call and cancel the procedure. . Arrival: You must be in the facility at least 30 minutes prior to your scheduled procedure. . Children: Do not bring children with you. . Dress appropriately: Bring dark clothing that you would not mind if they get stained. . Valuables: Do not bring any jewelry or valuables.  Procedure appointments are reserved for interventional treatments only. Marland Kitchen No Prescription Refills. . No medication changes will be discussed during procedure appointments. . No disability issues will be discussed.  Remember:  Regular Business hours are:  Monday to Thursday 8:00 AM to 4:00 PM  Provider's Schedule: Milinda Pointer, MD:  Procedure days: Tuesday and Thursday 7:30 AM to 4:00 PM  Gillis Santa, MD:  Procedure days: Monday and Wednesday 7:30 AM to 4:00 PM ____________________________________________________________________________________________   Pain Management Discharge Instructions  General Discharge Instructions :  If you need to reach your doctor call: Monday-Friday 8:00 am - 4:00 pm at [phone] or toll free [phone].  After clinic hours [phone] to have operator reach doctor.  Bring all of your medication bottles to all your appointments in the pain clinic.  To cancel or reschedule your appointment with Pain Management please remember to call 24 hours in advance to avoid a fee.  Refer to the educational materials which you have been given on: General Risks, I had my Procedure. Discharge Instructions, Post Sedation.  Post Procedure Instructions:  The drugs you were given will stay in your system until tomorrow, so for the next 24 hours you should not drive, make any legal  decisions or drink any alcoholic beverages.  You may eat anything you prefer, but it is better to start with liquids then soups and crackers, and gradually work up to solid foods.  Please notify your doctor immediately if you have any unusual bleeding, trouble breathing or pain that is not related to your normal pain.  Depending on the type of procedure that was done, some parts of your body may feel week and/or numb.  This usually clears up by tonight or the next day.  Walk with the use of an assistive device or accompanied by an adult for the 24 hours.  You may use ice on the affected area for the first 24 hours.  Put ice in a Ziploc bag and cover with a towel and place against area 15 minutes on 15 minutes off.  You may switch to heat after 24 hours.GENERAL RISKS AND COMPLICATIONS  What are the risk, side effects and possible complications? Generally speaking, most procedures are safe.  However, with any procedure there are risks, side effects, and the possibility of complications.  The risks and complications are dependent upon the sites that are lesioned, or the type of nerve block to be performed.  The closer the procedure is to the spine, the more serious the risks are.  Great care is taken when placing the radio frequency needles, block needles or lesioning probes, but sometimes complications can occur. 1. Infection: Any  time there is an injection through the skin, there is a risk of infection.  This is why sterile conditions are used for these blocks.  There are four possible types of infection. 1. Localized skin infection. 2. Central Nervous System Infection-This can be in the form of Meningitis, which can be deadly. 3. Epidural Infections-This can be in the form of an epidural abscess, which can cause pressure inside of the spine, causing compression of the spinal cord with subsequent paralysis. This would require an emergency surgery to decompress, and there are no guarantees that the  patient would recover from the paralysis. 4. Discitis-This is an infection of the intervertebral discs.  It occurs in about 1% of discography procedures.  It is difficult to treat and it may lead to surgery.        2. Pain: the needles have to go through skin and soft tissues, will cause soreness.       3. Damage to internal structures:  The nerves to be lesioned may be near blood vessels or    other nerves which can be potentially damaged.       4. Bleeding: Bleeding is more common if the patient is taking blood thinners such as  aspirin, Coumadin, Ticiid, Plavix, etc., or if he/she have some genetic predisposition  such as hemophilia. Bleeding into the spinal canal can cause compression of the spinal  cord with subsequent paralysis.  This would require an emergency surgery to  decompress and there are no guarantees that the patient would recover from the  paralysis.       5. Pneumothorax:  Puncturing of a lung is a possibility, every time a needle is introduced in  the area of the chest or upper back.  Pneumothorax refers to free air around the  collapsed lung(s), inside of the thoracic cavity (chest cavity).  Another two possible  complications related to a similar event would include: Hemothorax and Chylothorax.   These are variations of the Pneumothorax, where instead of air around the collapsed  lung(s), you may have blood or chyle, respectively.       6. Spinal headaches: They may occur with any procedures in the area of the spine.       7. Persistent CSF (Cerebro-Spinal Fluid) leakage: This is a rare problem, but may occur  with prolonged intrathecal or epidural catheters either due to the formation of a fistulous  track or a dural tear.       8. Nerve damage: By working so close to the spinal cord, there is always a possibility of  nerve damage, which could be as serious as a permanent spinal cord injury with  paralysis.       9. Death:  Although rare, severe deadly allergic reactions known as  "Anaphylactic  reaction" can occur to any of the medications used.      10. Worsening of the symptoms:  We can always make thing worse.  What are the chances of something like this happening? Chances of any of this occuring are extremely low.  By statistics, you have more of a chance of getting killed in a motor vehicle accident: while driving to the hospital than any of the above occurring .  Nevertheless, you should be aware that they are possibilities.  In general, it is similar to taking a shower.  Everybody knows that you can slip, hit your head and get killed.  Does that mean that you should not shower again?  Nevertheless always keep  in mind that statistics do not mean anything if you happen to be on the wrong side of them.  Even if a procedure has a 1 (one) in a 1,000,000 (million) chance of going wrong, it you happen to be that one..Also, keep in mind that by statistics, you have more of a chance of having something go wrong when taking medications.  Who should not have this procedure? If you are on a blood thinning medication (e.g. Coumadin, Plavix, see list of "Blood Thinners"), or if you have an active infection going on, you should not have the procedure.  If you are taking any blood thinners, please inform your physician.  How should I prepare for this procedure?  Do not eat or drink anything at least six hours prior to the procedure.  Bring a driver with you .  It cannot be a taxi.  Come accompanied by an adult that can drive you back, and that is strong enough to help you if your legs get weak or numb from the local anesthetic.  Take all of your medicines the morning of the procedure with just enough water to swallow them.  If you have diabetes, make sure that you are scheduled to have your procedure done first thing in the morning, whenever possible.  If you have diabetes, take only half of your insulin dose and notify our nurse that you have done so as soon as you arrive at the  clinic.  If you are diabetic, but only take blood sugar pills (oral hypoglycemic), then do not take them on the morning of your procedure.  You may take them after you have had the procedure.  Do not take aspirin or any aspirin-containing medications, at least eleven (11) days prior to the procedure.  They may prolong bleeding.  Wear loose fitting clothing that may be easy to take off and that you would not mind if it got stained with Betadine or blood.  Do not wear any jewelry or perfume  Remove any nail coloring.  It will interfere with some of our monitoring equipment.  NOTE: Remember that this is not meant to be interpreted as a complete list of all possible complications.  Unforeseen problems may occur.  BLOOD THINNERS The following drugs contain aspirin or other products, which can cause increased bleeding during surgery and should not be taken for 2 weeks prior to and 1 week after surgery.  If you should need take something for relief of minor pain, you may take acetaminophen which is found in Tylenol,m Datril, Anacin-3 and Panadol. It is not blood thinner. The products listed below are.  Do not take any of the products listed below in addition to any listed on your instruction sheet.  A.P.C or A.P.C with Codeine Codeine Phosphate Capsules #3 Ibuprofen Ridaura  ABC compound Congesprin Imuran rimadil  Advil Cope Indocin Robaxisal  Alka-Seltzer Effervescent Pain Reliever and Antacid Coricidin or Coricidin-D  Indomethacin Rufen  Alka-Seltzer plus Cold Medicine Cosprin Ketoprofen S-A-C Tablets  Anacin Analgesic Tablets or Capsules Coumadin Korlgesic Salflex  Anacin Extra Strength Analgesic tablets or capsules CP-2 Tablets Lanoril Salicylate  Anaprox Cuprimine Capsules Levenox Salocol  Anexsia-D Dalteparin Magan Salsalate  Anodynos Darvon compound Magnesium Salicylate Sine-off  Ansaid Dasin Capsules Magsal Sodium Salicylate  Anturane Depen Capsules Marnal Soma  APF Arthritis pain  formula Dewitt's Pills Measurin Stanback  Argesic Dia-Gesic Meclofenamic Sulfinpyrazone  Arthritis Bayer Timed Release Aspirin Diclofenac Meclomen Sulindac  Arthritis pain formula Anacin Dicumarol Medipren Supac  Analgesic (Safety  coated) Arthralgen Diffunasal Mefanamic Suprofen  Arthritis Strength Bufferin Dihydrocodeine Mepro Compound Suprol  Arthropan liquid Dopirydamole Methcarbomol with Aspirin Synalgos  ASA tablets/Enseals Disalcid Micrainin Tagament  Ascriptin Doan's Midol Talwin  Ascriptin A/D Dolene Mobidin Tanderil  Ascriptin Extra Strength Dolobid Moblgesic Ticlid  Ascriptin with Codeine Doloprin or Doloprin with Codeine Momentum Tolectin  Asperbuf Duoprin Mono-gesic Trendar  Aspergum Duradyne Motrin or Motrin IB Triminicin  Aspirin plain, buffered or enteric coated Durasal Myochrisine Trigesic  Aspirin Suppositories Easprin Nalfon Trillsate  Aspirin with Codeine Ecotrin Regular or Extra Strength Naprosyn Uracel  Atromid-S Efficin Naproxen Ursinus  Auranofin Capsules Elmiron Neocylate Vanquish  Axotal Emagrin Norgesic Verin  Azathioprine Empirin or Empirin with Codeine Normiflo Vitamin E  Azolid Emprazil Nuprin Voltaren  Bayer Aspirin plain, buffered or children's or timed BC Tablets or powders Encaprin Orgaran Warfarin Sodium  Buff-a-Comp Enoxaparin Orudis Zorpin  Buff-a-Comp with Codeine Equegesic Os-Cal-Gesic   Buffaprin Excedrin plain, buffered or Extra Strength Oxalid   Bufferin Arthritis Strength Feldene Oxphenbutazone   Bufferin plain or Extra Strength Feldene Capsules Oxycodone with Aspirin   Bufferin with Codeine Fenoprofen Fenoprofen Pabalate or Pabalate-SF   Buffets II Flogesic Panagesic   Buffinol plain or Extra Strength Florinal or Florinal with Codeine Panwarfarin   Buf-Tabs Flurbiprofen Penicillamine   Butalbital Compound Four-way cold tablets Penicillin   Butazolidin Fragmin Pepto-Bismol   Carbenicillin Geminisyn Percodan   Carna Arthritis Reliever Geopen  Persantine   Carprofen Gold's salt Persistin   Chloramphenicol Goody's Phenylbutazone   Chloromycetin Haltrain Piroxlcam   Clmetidine heparin Plaquenil   Cllnoril Hyco-pap Ponstel   Clofibrate Hydroxy chloroquine Propoxyphen         Before stopping any of these medications, be sure to consult the physician who ordered them.  Some, such as Coumadin (Warfarin) are ordered to prevent or treat serious conditions such as "deep thrombosis", "pumonary embolisms", and other heart problems.  The amount of time that you may need off of the medication may also vary with the medication and the reason for which you were taking it.  If you are taking any of these medications, please make sure you notify your pain physician before you undergo any procedures.         Selective Nerve Root Block Patient Information  Description: Specific nerve roots exit the spinal canal and these nerves can be compressed and inflamed by a bulging disc and bone spurs.  By injecting steroids on the nerve root, we can potentially decrease the inflammation surrounding these nerves, which often leads to decreased pain.  Also, by injecting local anesthesia on the nerve root, this can provide [place] helpful information to give to your referring doctor if it decreases your pain.  Selective nerve root blocks can be done along the spine from the neck to the low back depending on the location of your pain.   After numbing the skin with local anesthesia, a small needle is passed to the nerve root and the position of the needle is verified using x-ray pictures.  After the needle is in correct position, we then deposit the medication.  You may experience a pressure sensation while this is being done.  The entire block usually lasts less than 15 minutes.  Conditions that may be treated with selective nerve root blocks:  Low back and leg pain  Spinal stenosis  Diagnostic block prior to potential surgery  Neck and arm pain  Post  laminectomy syndrome  Preparation for the injection:  1. Do not eat any solid food  or dairy products within 8 hours of your appointment. 2. You may drink clear liquids up to 3 hours before an appointment.  Clear liquids include water, black coffee, juice or soda.  No milk or cream please. 3. You may take your regular medications, including pain medications, with a sip of water before your appointment.  Diabetics should hold regular insulin (if taken separately) and take 1/2 normal NPH dose the morning of the procedure.  Carry some sugar containing items with you to your appointment. 4. A driver must accompany you and be prepared to drive you home after your procedure. 5. Bring all your current medications with you. 6. An IV may be inserted and sedation may be given at the discretion of the physician. 7. A blood pressure cuff, EKG, and other monitors will often be applied during the procedure.  Some patients may need to have extra oxygen administered for a short period. 8. You will be asked to provide medical information, including allergies, prior to the procedure.  We must know immediately if you are taking blood  Thinners (like Coumadin) or if you are allergic to IV iodine contrast (dye).  Possible side-effects: All are usually temporary  Bleeding from needle site  Light headedness  Numbness and tingling  Decreased blood pressure  Weakness in arms/legs  Pressure sensation in back/neck  Pain at injection site (several days)  Possible complications: All are extremely rare  Infection  Nerve injury  Spinal headache (a headache wore with upright position)  Call if you experience:  Fever/chills associated with headache or increased back/neck pain  Headache worsened by an upright position  New onset weakness or numbness of an extremity below the injection site  Hives or difficulty breathing (go to the emergency room)  Inflammation or drainage at the injection  site(s)  Severe back/neck pain greater than usual  New symptoms which are concerning to you  Please note:  Although the local anesthetic injected can often make your back or neck feel good for several hours after the injection the pain will likely return.  It takes 3-5 days for steroids to work on the nerve root. You may not notice any pain relief for at least one week.  If effective, we will often do a series of 3 injections spaced 3-6 weeks apart to maximally decrease your pain.    If you have any questions, please call [phone] [facility]

## 2017-11-09 NOTE — Progress Notes (Signed)
Patient's Name: Johnny Williams  MRN: [id number]  Referring Provider: [facility], [place]*  DOB: Jun 08, 1961  PCP: [facility], [place]  DOS: 11/09/2017  Note by: Gaspar Cola, MD  Service setting: Ambulatory outpatient  Specialty: Interventional Pain Management  Location: [facility] (AMB) Pain Management Facility    Patient type: Established   Primary Reason(s) for Visit: Encounter for post-procedure evaluation of chronic illness with mild to moderate exacerbation CC: Back Pain (left hip pain)  HPI  Johnny Williams is a 57 y.o. year old, male patient, who comes today for a post-procedure evaluation. 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 His primarily concern today is the Back Pain (left hip pain)  Pain Assessment: Location: Left Hip Radiating: The pain is worse in the left hip since last procedure, but no longer travels down the leg. Onset: More than a month ago Duration: Chronic pain Quality: Aching, Constant,  Jabbing Severity: 6 /10 (subjective, self-reported pain score)  Note: Reported level is inconsistent with clinical observations. Clinically the patient looks like a 2/10 A 2/10 is viewed as "Mild to Moderate" and described as noticeable and distracting. Impossible to hide from other people. More frequent flare-ups. Still possible to adapt and function close to normal. It can be very annoying and may have occasional stronger flare-ups. With discipline, patients may get used to it and adapt. Information on the proper use of the pain scale provided to the patient today. When using our objective Pain Scale, levels between 6 and 10/10 are said to belong in an emergency room, as it progressively worsens from a 6/10, described as severely limiting, requiring emergency care not usually available at an outpatient pain management facility. At a 6/10 level, communication becomes difficult and requires great effort. Assistance to reach the emergency department may be required. Facial flushing and profuse sweating along with potentially dangerous increases in heart rate and blood pressure will be evident. Effect on ADL:  have to pace self. Timing: Constant Modifying factors: nothing. BP: 122/72  HR: 83  Johnny Williams comes in today for post-procedure evaluation after the treatment done on 10/28/2017.  Further details on both, my assessment(s), as well as the proposed treatment plan, please see below.  Post-Procedure Assessment  10/27/2017 Procedure: Diagnostic bilateral intra-articular hip joint injection #1 under fluoroscopic guidance, no sedation Pre-procedure pain score:  5/10 Post-procedure pain score: 0/10 (100% relief) Influential Factors: BMI: 35.92 kg/m Intra-procedural challenges: None observed.         Assessment challenges: None detected.              Reported side-effects: None.        Post-procedural adverse reactions or complications: None reported         Sedation: No sedation used. When no  sedatives are used, the analgesic levels obtained are directly associated to the effectiveness of the local anesthetics. However, when sedation is provided, the level of analgesia obtained during the initial 1 hour following the intervention, is believed to be the result of a combination of factors. These factors may include, but are not limited to: 1. The effectiveness of the local anesthetics used. 2. The effects of the analgesic(s) and/or anxiolytic(s) used. 3. The degree of discomfort experienced by the patient at the time of the procedure. 4. The patients ability and reliability in recalling and recording the events. 5. The presence and influence of possible secondary gains and/or psychosocial factors. Reported result: Relief experienced during the 1st hour after the procedure: 80 % (Ultra-Short Term Relief)            Interpretative annotation: Clinically appropriate result. No IV Analgesic or Anxiolytic given, therefore benefits are completely due to Local Anesthetic effects.          Effects of local anesthetic: The analgesic effects attained during this period are directly associated to the localized infiltration of local anesthetics and therefore cary significant diagnostic value as to the etiological location, or anatomical origin, of the pain. Expected duration of relief is directly dependent on the pharmacodynamics of the local anesthetic used. Long-acting (4-6 hours) anesthetics used.  Reported result: Relief during the next 4 to 6 hour after the procedure: 50 % (Short-Term Relief)            Interpretative annotation: Clinically appropriate result. Analgesia during this period is likely to be Local Anesthetic-related.          Long-term benefit: Defined as the period of time past the expected duration of local anesthetics (1 hour for short-acting and 4-6 hours for long-acting). With the possible exception of prolonged sympathetic blockade from the local anesthetics, benefits during this  period are typically attributed to, or associated with, other factors such as analgesic sensory neuropraxia, antiinflammatory effects, or beneficial biochemical changes provided by agents other than the local anesthetics.  Reported result: Extended relief following procedure: 100 % on the right hip and 0% on the left (Long-Term Relief)            Interpretative annotation: Clinically possible results.    Incomplete therapeutic success. Inflammation plays a part in the etiology to the pain. The pain in the area of the right hip clearly was coming from the hip itself. In the case of the left one, hematocrit being coming from somewhere else.  Current benefits: Defined as reported results that persistent at this point in time.   Analgesia: 100 % on the right hip and 0% on the left  Johnny Williams reports improvement of arthralgia on the right side, but not on the left. Function: Johnny Williams reports improvement in function on the right side. ROM: Johnny Williams reports improvement in ROM on the right side. Interpretative annotation: Ongoing benefit. Incomplete therapeutic success. Effective therapeutic approach on the right side, but not on the left.          Interpretation: Results would suggest a successful diagnostic intervention.                  Plan:  Re-assessment of algesic etiology. Based on these results, today I have repeated his physical exam and although he does seem to be having some discomfort coming from the hip joint, as shown in the [place]a maneuver, I also was able to reproduce his pain by having him do a lateral bending  as well as hyperextension and rotation. Reviewing his MRI indicates that he has some severe problems at the L5-S1 foramen that could be associated with this pain. I will be bringing him back as soon as possible for a diagnostic left sided L4 and L5 transforaminal epidural steroid injection under fluoroscopic guidance and IV sedation to determine if this is worth that left hip pain is  coming from.          Laboratory Chemistry  Inflammation Markers (CRP: Acute Phase) (ESR: Chronic Phase) Lab Results  Component Value Date   CRP 1.0 09/29/2017   ESRSEDRATE 7 09/29/2017                         Renal Function Markers Lab Results  Component Value Date   BUN 14 09/29/2017   CREATININE 0.91 09/29/2017   BCR 15 09/29/2017   GFRAA 108 09/29/2017   GFRNONAA 93 09/29/2017                              Hepatic Function Markers Lab Results  Component Value Date   AST 16 09/29/2017   ALBUMIN 4.8 09/29/2017   ALKPHOS 67 09/29/2017                        Electrolytes Lab Results  Component Value Date   NA 144 09/29/2017   K 4.1 09/29/2017   CL 102 09/29/2017   CALCIUM 9.8 09/29/2017   MG 1.9 09/29/2017                        Neuropathy Markers Lab Results  Component Value Date   VITAMINB12 606 09/29/2017                        Bone Pathology Markers Lab Results  Component Value Date   25OHVITD1 27 (L) 09/29/2017   25OHVITD2 <1.0 09/29/2017   25OHVITD3 27 09/29/2017                         Note: Lab results reviewed.  Recent Diagnostic Imaging Results  DG C-Arm 1-60 Min-No Report Fluoroscopy was utilized by the requesting physician.  No radiographic  interpretation.   Complexity Note: I personally reviewed the fluoroscopic imaging of the procedure.                        Meds   Current Outpatient Medications:  .  acetaminophen (TYLENOL) 325 MG tablet, Take 650 mg by mouth every 6 (six) hours as needed., Disp: , Rfl:  .  Aspirin-Salicylamide-Caffeine (ARTHRITIS STRENGTH BC POWDER PO), Take by mouth., Disp: , Rfl:  .  atorvastatin (LIPITOR) 40 MG tablet, Take 40 mg by mouth daily., Disp: , Rfl:  .  Calcium Carbonate-Vit D-Min (GNP CALCIUM 1200) 1200-1000 MG-UNIT CHEW, Chew 1,200 mg by mouth daily with breakfast. Take in combination with vitamin D and magnesium., Disp: 30 tablet, Rfl: 5 .  Cholecalciferol (VITAMIN D3) 5000 units CAPS, Take 1 capsule  (5,000 Units total) by mouth daily with breakfast. Take along with calcium and magnesium., Disp: 30 capsule, Rfl: 5 .  cyanocobalamin ([employer] VITAMIN B12) 2000 MCG tablet, Take 1 tablet (2,000 mcg total) by mouth daily., Disp: 90 tablet, Rfl: 0 .  diclofenac sodium (VOLTAREN) 1 % GEL, Apply topically 4 (  four) times daily., Disp: , Rfl:  .  ergocalciferol (VITAMIN D2) 50000 units capsule, Take 1 capsule (50,000 Units total) by mouth 2 (two) times a week. X 6 weeks., Disp: 12 capsule, Rfl: 0 .  hydrochlorothiazide (HYDRODIURIL) 25 MG tablet, Take 25 mg by mouth daily., Disp: , Rfl:  .  Magnesium 500 MG CAPS, Take 1 capsule (500 mg total) by mouth 2 (two) times daily at 8 am and 10 pm., Disp: 60 capsule, Rfl: 5 .  Multiple Vitamins-Minerals (ONE-A-DAY 50 PLUS PO), Take by mouth., Disp: , Rfl:  .  omeprazole (PRILOSEC) 20 MG capsule, Take 20 mg by mouth daily., Disp: , Rfl:  .  varenicline (CHANTIX) 1 MG tablet, Take 1 mg by mouth 2 (two) times daily., Disp: , Rfl:  .  verapamil (VERELAN PM) 240 MG 24 hr capsule, Take 240 mg by mouth at bedtime., Disp: , Rfl:  No current facility-administered medications for this visit.   Facility-Administered Medications Ordered in Other Visits:  .  fentaNYL (SUBLIMAZE) injection 25-50 mcg, 25-50 mcg, Intravenous, Q5 min PRN, Milinda Pointer, MD, 50 mcg at 11/10/17 0943 .  midazolam (VERSED) 5 MG/5ML injection 1-2 mg, 1-2 mg, Intravenous, Q5 min PRN, Milinda Pointer, MD, 2 mg at 11/10/17 0943 .  ropivacaine (PF) 2 mg/mL (0.2%) (NAROPIN) injection 1 mL, 1 mL, Epidural, Once, Milinda Pointer, MD .  sodium chloride flush (NS) 0.9 % injection 1 mL, 1 mL, Other, Once, Milinda Pointer, MD  ROS  Constitutional: Denies any fever or chills Gastrointestinal: No reported hemesis, hematochezia, vomiting, or acute GI distress Musculoskeletal: Denies any acute onset joint swelling, redness, loss of ROM, or weakness Neurological: No reported episodes of acute onset  apraxia, aphasia, dysarthria, agnosia, amnesia, paralysis, loss of coordination, or loss of consciousness  Allergies  Johnny Williams is allergic to fluoxetine.  PFSH  Drug: Johnny Williams  has no drug history on file. Alcohol:  reports that he drank alcohol. Tobacco:  reports that he has been smoking.  He has a 80.00 pack-year smoking history. He has never used smokeless tobacco. Medical:  has a past medical history of Arthritis, Hyperlipidemia, Hypertension, Joint ache, and Reflux esophagitis. Surgical: Johnny Williams  has no past surgical history on file. Family: family history includes Heart disease in his mother; Hypertension in his mother.  Constitutional Exam  General appearance: Well nourished, well developed, and well hydrated. In no apparent acute distress Vitals:   11/09/17 1021  BP: 122/72  Pulse: 83  Resp: (!) 22  Temp: 98.3 F (36.8 C)  TempSrc: Oral  SpO2: 97%  Weight: 279 lb 12.8 oz (126.9 kg)  Height: 6' 2"  (1.88 m)   BMI Assessment: Estimated body mass index is 35.92 kg/m as calculated from the following:   Height as of this encounter: 6' 2"  (1.88 m).   Weight as of this encounter: 279 lb 12.8 oz (126.9 kg).  BMI interpretation table: BMI level Category Range association with higher incidence of chronic pain  <18 kg/m2 Underweight   18.5-24.9 kg/m2 Ideal body weight   25-29.9 kg/m2 Overweight Increased incidence by 20%  30-34.9 kg/m2 Obese (Class I) Increased incidence by 68%  35-39.9 kg/m2 Severe obesity (Class II) Increased incidence by 136%  >40 kg/m2 Extreme obesity (Class III) Increased incidence by 254%   Patient's current BMI Ideal Body weight  Body mass index is 35.92 kg/m. Ideal body weight: 82.2 kg (181 lb 3.5 oz) Adjusted ideal body weight: 100.1 kg (220 lb 10.4 oz)   BMI Readings from  Last 4 Encounters:  11/10/17 35.95 kg/m  11/09/17 35.92 kg/m  10/27/17 37.23 kg/m  10/12/17 39.33 kg/m   Wt Readings from Last 4 Encounters:  11/10/17 280 lb (127 kg)   11/09/17 279 lb 12.8 oz (126.9 kg)  10/27/17 290 lb (131.5 kg)  10/12/17 290 lb (131.5 kg)  Psych/Mental status: Alert, oriented x 3 (person, place, & time)       Eyes: PERLA Respiratory: No evidence of acute respiratory distress  Cervical Spine Area Exam  Skin & Axial Inspection: No masses, redness, edema, swelling, or associated skin lesions Alignment: Symmetrical Functional ROM: Unrestricted ROM      Stability: No instability detected Muscle Tone/Strength: Functionally intact. No obvious neuro-muscular anomalies detected. Sensory (Neurological): Unimpaired Palpation: No palpable anomalies              Upper Extremity (UE) Exam    Side: Right upper extremity  Side: Left upper extremity  Skin & Extremity Inspection: Skin color, temperature, and hair growth are WNL. No peripheral edema or cyanosis. No masses, redness, swelling, asymmetry, or associated skin lesions. No contractures.  Skin & Extremity Inspection: Skin color, temperature, and hair growth are WNL. No peripheral edema or cyanosis. No masses, redness, swelling, asymmetry, or associated skin lesions. No contractures.  Functional ROM: Unrestricted ROM          Functional ROM: Unrestricted ROM          Muscle Tone/Strength: Functionally intact. No obvious neuro-muscular anomalies detected.  Muscle Tone/Strength: Functionally intact. No obvious neuro-muscular anomalies detected.  Sensory (Neurological): Unimpaired          Sensory (Neurological): Unimpaired          Palpation: No palpable anomalies              Palpation: No palpable anomalies              Specialized Test(s): Deferred         Specialized Test(s): Deferred          Thoracic Spine Area Exam  Skin & Axial Inspection: No masses, redness, or swelling Alignment: Symmetrical Functional ROM: Unrestricted ROM Stability: No instability detected Muscle Tone/Strength: Functionally intact. No obvious neuro-muscular anomalies detected. Sensory (Neurological):  Unimpaired Muscle strength & Tone: No palpable anomalies  Lumbar Spine Area Exam  Skin & Axial Inspection: No masses, redness, or swelling Alignment: Symmetrical Functional ROM: Unrestricted ROM       Stability: No instability detected Muscle Tone/Strength: Functionally intact. No obvious neuro-muscular anomalies detected. Sensory (Neurological): Movement-associated pain Palpation: No palpable anomalies       Provocative Tests: Lumbar Hyperextension and rotation test: deferred today       Lumbar quadrant test (Kemp's test): Positive on the left for foraminal stenosis Lumbar Lateral bending test: Positive ipsilateral radicular pain, on the left. Positive for left-sided foraminal stenosis. Patrick's Maneuver: Positive for bilateral hip pain             FABER test: Negative                    Gait & Posture Assessment  Ambulation: Unassisted Gait: Relatively normal for age and body habitus Posture: WNL   Lower Extremity Exam    Side: Right lower extremity  Side: Left lower extremity  Stability: No instability observed          Stability: No instability observed          Skin & Extremity Inspection: Skin color, temperature, and hair growth are WNL.  No peripheral edema or cyanosis. No masses, redness, swelling, asymmetry, or associated skin lesions. No contractures.  Skin & Extremity Inspection: Skin color, temperature, and hair growth are WNL. No peripheral edema or cyanosis. No masses, redness, swelling, asymmetry, or associated skin lesions. No contractures.  Functional ROM: Unrestricted ROM                  Functional ROM: Unrestricted ROM                  Muscle Tone/Strength: Functionally intact. No obvious neuro-muscular anomalies detected.  Muscle Tone/Strength: Functionally intact. No obvious neuro-muscular anomalies detected.  Sensory (Neurological): Unimpaired  Sensory (Neurological): Unimpaired  Palpation: No palpable anomalies  Palpation: No palpable anomalies   Assessment   Primary Diagnosis & Pertinent Problem List: The primary encounter diagnosis was Chronic hip pain (Primary Area of Pain) (Bilateral) (L>R). 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  Status Diagnosis  Improving Stable Persistent 1. Chronic hip pain (Primary Area of Pain) (Bilateral) (L>R)   2. 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 Chronic lower extremity pain (Tertiary Area of Pain) (Bilateral) (L>R)   5. Chronic knee pain (Fourth Area of Pain) (Bilateral) (L>R)   6. Lumbar facet syndrome (Bilateral) (L>R)   7. Chronic pain syndrome   8. Left lumbar radiculitis   9. DDD (degenerative disc disease), lumbar     Problems updated and reviewed during this visit: No problems updated. Plan of Care  Pharmacotherapy (Medications Ordered): No orders of the defined types were placed in this encounter.  Medications administered today: Johnny Williams had no medications administered during this visit.   Procedure Orders     Lumbar Transforaminal Epidural Lab Orders  No laboratory test(s) ordered today   Imaging Orders  No imaging studies ordered today   Referral Orders  No referral(s) requested today    Interventional management options: Planned, scheduled, and/or pending:   Diagnostic left sided L4 and L5 transforaminal ESI #1 under fluoroscopic guidance and IV sedation    Considering:   Diagnosticbilateral lumbar facet nerve block  Possiblebilateral bilateral lumbar facet RFA  Diagnosticleft sided LESI   Diagnosticleft intra-articular hip injection  Diagnosticleft intra-articular knee injection  Possible bilateral intra-articular Hyalgan knee injection series  Diagnosticleft genicular nerve block  Possibleleft genicular nerve artifact    Palliative PRN treatment(s):   None at this time   Provider-requested follow-up: Return for Procedure (w/ sedation): (L) L4 + L5 TFESI #1.  Future Appointments  Date Time Provider [place]  12/02/2017  8:15 AM Milinda Pointer, MD [facility] None   Primary Care Physician: [facility], [place] Location: [facility] Facility Note by: Gaspar Cola, MD Date: 11/09/2017; Time: 1:47 PM

## 2017-11-09 NOTE — Progress Notes (Signed)
Safety precautions to be maintained throughout the outpatient stay will include: orient to surroundings, keep bed in low position, maintain call bell within reach at all times, provide assistance with transfer out of bed and ambulation.  

## 2017-11-10 ENCOUNTER — Ambulatory Visit: Payer: Medicaid Other | Admitting: Pain Medicine

## 2017-11-10 ENCOUNTER — Other Ambulatory Visit: Payer: Self-pay

## 2017-11-10 ENCOUNTER — Encounter: Payer: Self-pay | Admitting: Pain Medicine

## 2017-11-10 ENCOUNTER — Ambulatory Visit
Admission: RE | Admit: 2017-11-10 | Discharge: 2017-11-10 | Disposition: A | Discharge status: Home / Self Care | Payer: Medicaid Other | Source: Ambulatory Visit | Attending: Pain Medicine | Admitting: Pain Medicine

## 2017-11-10 VITALS — BP 129/67 | HR 76 | Temp 97.3°F | Resp 16 | Ht 74.0 in | Wt 280.0 lb

## 2017-11-10 DIAGNOSIS — M79604 Pain in right leg: Secondary | ICD-10-CM | POA: Diagnosis not present

## 2017-11-10 DIAGNOSIS — M5116 Intervertebral disc disorders with radiculopathy, lumbar region: Secondary | ICD-10-CM | POA: Insufficient documentation

## 2017-11-10 DIAGNOSIS — M79605 Pain in left leg: Secondary | ICD-10-CM | POA: Diagnosis not present

## 2017-11-10 DIAGNOSIS — M549 Dorsalgia, unspecified: Secondary | ICD-10-CM | POA: Diagnosis present

## 2017-11-10 DIAGNOSIS — G8929 Other chronic pain: Secondary | ICD-10-CM | POA: Diagnosis not present

## 2017-11-10 DIAGNOSIS — M5136 Other intervertebral disc degeneration, lumbar region: Secondary | ICD-10-CM

## 2017-11-10 DIAGNOSIS — M5442 Lumbago with sciatica, left side: Secondary | ICD-10-CM

## 2017-11-10 DIAGNOSIS — M5416 Radiculopathy, lumbar region: Secondary | ICD-10-CM

## 2017-11-10 MED ORDER — LACTATED RINGERS IV SOLN
1000.0000 mL | Freq: Once | INTRAVENOUS | Status: AC
  Administered 2017-11-10: 1000 mL via INTRAVENOUS

## 2017-11-10 MED ORDER — ROPIVACAINE HCL 2 MG/ML IJ SOLN
1.0000 mL | Freq: Once | INTRAMUSCULAR | Status: AC
  Administered 2017-11-10: 2 mL via EPIDURAL
  Filled 2017-11-10: qty 10

## 2017-11-10 MED ORDER — LIDOCAINE HCL 2 % IJ SOLN
20.0000 mL | Freq: Once | INTRAMUSCULAR | Status: AC
  Administered 2017-11-10: 400 mg
  Filled 2017-11-10: qty 40

## 2017-11-10 MED ORDER — DEXAMETHASONE SODIUM PHOSPHATE 10 MG/ML IJ SOLN
10.0000 mg | Freq: Once | INTRAMUSCULAR | Status: AC
  Administered 2017-11-10: 10 mg
  Filled 2017-11-10: qty 1

## 2017-11-10 MED ORDER — ROPIVACAINE HCL 2 MG/ML IJ SOLN
1.0000 mL | Freq: Once | INTRAMUSCULAR | Status: DC
  Filled 2017-11-10: qty 10

## 2017-11-10 MED ORDER — MIDAZOLAM HCL 5 MG/5ML IJ SOLN
1.0000 mg | INTRAMUSCULAR | Status: DC | PRN
  Administered 2017-11-10: 2 mg via INTRAVENOUS
  Filled 2017-11-10: qty 5

## 2017-11-10 MED ORDER — SODIUM CHLORIDE 0.9% FLUSH: 1.0000 mL | Freq: Once | INTRAVENOUS | Status: DC

## 2017-11-10 MED ORDER — IOPAMIDOL (ISOVUE-M 200) INJECTION 41%
10.0000 mL | Freq: Once | INTRAMUSCULAR | Status: AC
  Administered 2017-11-10: 10 mL via EPIDURAL
  Filled 2017-11-10: qty 10

## 2017-11-10 MED ORDER — FENTANYL CITRATE (PF) 100 MCG/2ML IJ SOLN
25.0000 ug | INTRAMUSCULAR | Status: DC | PRN
  Administered 2017-11-10: 50 ug via INTRAVENOUS
  Filled 2017-11-10: qty 2

## 2017-11-10 MED ORDER — SODIUM CHLORIDE 0.9% FLUSH
1.0000 mL | Freq: Once | INTRAVENOUS | Status: AC
  Administered 2017-11-10: 2 mL

## 2017-11-10 NOTE — Progress Notes (Signed)
Patient's Name: Johnny Williams  MRN: [id number]  Referring Provider: [facility], [place]: [birth year]  PCP: [facility], [place]  DOS: 11/10/2017  Note by: Gaspar Cola, MD  Service setting: Ambulatory outpatient  Specialty: Interventional Pain Management  Patient type: Established  Location: [facility] (AMB) Pain Management Facility  Visit type: Interventional Procedure   Primary Reason for Visit: Interventional Pain Management Treatment. CC: Back Pain (left hip)  Procedure:  Anesthesia, Analgesia, Anxiolysis:  Type: Trans-Foraminal Epidural Steroid Injection #1 Purpose: Diagnostic Region: Posterolateral Lumbosacral Level: L4 & L5 Level Laterality: Left-Sided Paravertebral Target Area: The 6 o'clock position under the pedicle, on the affected side. Approach: Posterior Percutaneous Paravertebral approach. Position: Prone  Type: Moderate (Conscious) Sedation combined with Local Anesthesia Indication(s): Analgesia and Anxiety Route: Intravenous (IV) IV Access: Secured Sedation: Meaningful verbal contact was maintained at all times during the procedure  Local Anesthetic: Lidocaine 1-2%   Indications: 1. DDD (degenerative disc disease), lumbar   2. Left lumbar radiculitis   3. Chronic lower extremity pain (Tertiary Area of Pain) (Bilateral) (L>R)   4. 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 (year old), male patient, seen today for interventional treatment. He  has no past surgical history on file. Johnny Williams has a current medication list which includes the following prescription(s): acetaminophen, aspirin-salicylamide-caffeine, atorvastatin, gnp calcium 1200, vitamin d3, cyanocobalamin, diclofenac sodium, ergocalciferol, hydrochlorothiazide, magnesium, multiple vitamins-minerals, omeprazole, varenicline, and verapamil, and the  following Facility-Administered Medications: fentanyl, midazolam, ropivacaine (pf) 2 mg/ml (0.2%), and sodium chloride flush. His primarily concern today is the Back Pain (left hip)  Initial Vital Signs:  Pulse/HCG Rate: 83ECG Heart Rate: 73 Temp: 97.8 F (36.6 C) Resp: 17 BP: 128/82 SpO2: 94 %  BMI: Estimated body mass index is 35.95 kg/m as calculated from the following:   Height as of this encounter: 6\' 2"  (1.88 m).   Weight as of this encounter: 280 lb (127 kg).  Risk Assessment: Allergies: Reviewed. He is allergic to fluoxetine.  Allergy Precautions: None required Coagulopathies: Reviewed. None identified.  Blood-thinner therapy: None at this time Active Infection(s): Reviewed. None identified. Johnny Williams is afebrile  Site Confirmation: Johnny Williams was asked to confirm the procedure and laterality before marking the site Procedure checklist: Completed Consent: Before the procedure and under the influence of no sedative(s), amnesic(s), or anxiolytics, the patient was informed of the treatment options, risks and possible complications. To fulfill our ethical and legal obligations, as recommended by the American Medical Association's Code of Ethics, I have informed the patient of my clinical impression; the nature and purpose of the treatment or procedure; the risks, benefits, and possible complications of the intervention; the alternatives, including doing nothing; the risk(s) and benefit(s) of the alternative treatment(s) or procedure(s); and the risk(s) and benefit(s) of doing nothing. The patient was provided information about the general risks and possible complications associated with the procedure. These may include, but are not limited to: failure to achieve desired goals, infection, bleeding, organ or nerve damage, allergic reactions, paralysis, and death. In addition, the patient was informed of those risks and complications associated to Spine-related procedures, such as failure to  decrease pain; infection (i.e.: Meningitis, epidural or intraspinal abscess); bleeding (i.e.: epidural hematoma, subarachnoid hemorrhage, or any other type of intraspinal or peri-dural bleeding); organ or nerve damage (i.e.: Any type of peripheral nerve, nerve root, or  spinal cord injury) with subsequent damage to sensory, motor, and/or autonomic systems, resulting in permanent pain, numbness, and/or weakness of one or several areas of the body; allergic reactions; (i.e.: anaphylactic reaction); and/or death. Furthermore, the patient was informed of those risks and complications associated with the medications. These include, but are not limited to: allergic reactions (i.e.: anaphylactic or anaphylactoid reaction(s)); adrenal axis suppression; blood sugar elevation that in diabetics may result in ketoacidosis or comma; water retention that in patients with history of congestive heart failure may result in shortness of breath, pulmonary edema, and decompensation with resultant heart failure; weight gain; swelling or edema; medication-induced neural toxicity; particulate matter embolism and blood vessel occlusion with resultant organ, and/or nervous system infarction; and/or aseptic necrosis of one or more joints. Finally, the patient was informed that Medicine is not an exact science; therefore, there is also the possibility of unforeseen or unpredictable risks and/or possible complications that may result in a catastrophic outcome. The patient indicated having understood very clearly. We have given the patient no guarantees and we have made no promises. Enough time was given to the patient to ask questions, all of which were answered to the patient's satisfaction. Johnny Williams has indicated that he wanted to continue with the procedure. Attestation: I, the ordering provider, attest that I have discussed with the patient the benefits, risks, side-effects, alternatives, likelihood of achieving goals, and potential  problems during recovery for the procedure that I have provided informed consent. Date  Time: 11/10/2017  8:50 AM  Pre-Procedure Preparation:  Monitoring: As per clinic protocol. Respiration, ETCO2, SpO2, BP, heart rate and rhythm monitor placed and checked for adequate function Safety Precautions: Patient was assessed for positional comfort and pressure points before starting the procedure. Time-out: I initiated and conducted the "Time-out" before starting the procedure, as per protocol. The patient was asked to participate by confirming the accuracy of the "Time Out" information. Verification of the correct person, site, and procedure were performed and confirmed by me, the nursing staff, and the patient. "Time-out" conducted as per Joint Commission's Universal Protocol (UP.01.01.01). Time: 1001  Description of Procedure:       Area Prepped: Entire Posterior Lumbosacral Area Prepping solution: ChloraPrep (2% chlorhexidine gluconate and 70% isopropyl alcohol) Safety Precautions: Aspiration looking for blood return was conducted prior to all injections. At no point did we inject any substances, as a needle was being advanced. No attempts were made at seeking any paresthesias. Safe injection practices and needle disposal techniques used. Medications properly checked for expiration dates. SDV (single dose vial) medications used. Description of the Procedure: Protocol guidelines were followed. The patient was placed in position over the procedure table. The target area was identified and the area prepped in the usual manner. Skin desensitized using vapocoolant spray. Skin & deeper tissues infiltrated with local anesthetic. Appropriate amount of time allowed to pass for local anesthetics to take effect. The procedure needles were then advanced to the target area. Proper needle placement secured. Negative aspiration confirmed. Solution injected in intermittent fashion, asking for systemic symptoms every  0.5cc of injectate. The needles were then removed and the area cleansed, making sure to leave some of the prepping solution back to take advantage of its long term bactericidal properties. Vitals:   11/10/17 1009 11/10/17 1019 11/10/17 1029 11/10/17 1041  BP: (!) 147/86 133/80 137/81 129/67  Pulse:      Resp: 16 16 16 16   Temp:  (!) 97.1 F (36.2 C)  (!) 97.3 F (36.3 C)  SpO2: 94% 98% 98% 99%  Weight:      Height:        Start Time: 1001 hrs. End Time: 1008 hrs. Materials:  Needle(s) Type: Regular needle Gauge: 22G Length: 3.5-in Medication(s): Please see orders for medications and dosing details.  Imaging Guidance (Spinal):  Type of Imaging Technique: Fluoroscopy Guidance (Spinal) Indication(s): Assistance in needle guidance and placement for procedures requiring needle placement in or near specific anatomical locations not easily accessible without such assistance. Exposure Time: Please see nurses notes. Contrast: Before injecting any contrast, we confirmed that the patient did not have an allergy to iodine, shellfish, or radiological contrast. Once satisfactory needle placement was completed at the desired level, radiological contrast was injected. Contrast injected under live fluoroscopy. No contrast complications. See chart for type and volume of contrast used. Fluoroscopic Guidance: I was personally present during the use of fluoroscopy. "Tunnel Vision Technique" used to obtain the best possible view of the target area. Parallax error corrected before commencing the procedure. "Direction-depth-direction" technique used to introduce the needle under continuous pulsed fluoroscopy. Once target was reached, antero-posterior, oblique, and lateral fluoroscopic projection used confirm needle placement in all planes. Images permanently stored in EMR. Interpretation: I personally interpreted the imaging intraoperatively. Adequate needle placement confirmed in multiple planes. Appropriate  spread of contrast into desired area was observed. No evidence of afferent or efferent intravascular uptake. No intrathecal or subarachnoid spread observed. Permanent images saved into the patient's record.  Antibiotic Prophylaxis:   Anti-infectives (From admission, onward)   None     Indication(s): None identified  Post-operative Assessment:  Post-procedure Vital Signs:  Pulse/HCG Rate: 7667 Temp: (!) 97.3 F (36.3 C) Resp: 16 BP: 129/67 SpO2: 99 %  EBL: None  Complications: No immediate post-treatment complications observed by team, or reported by patient.  Note: The patient tolerated the entire procedure well. A repeat set of vitals were taken after the procedure and the patient was kept under observation following institutional policy, for this type of procedure. Post-procedural neurological assessment was performed, showing return to baseline, prior to discharge. The patient was provided with post-procedure discharge instructions, including a section on how to identify potential problems. Should any problems arise concerning this procedure, the patient was given instructions to immediately contact us, at any time, without hesitation. In any case, we plan to contact the patient by telephone for a follow-up status report regarding this interventional procedure.  Comments:  No additional relevant information.  Plan of Care    Imaging Orders     DG C-Arm 1-60 Min-No Report  Procedure Orders     Lumbar Transforaminal Epidural  Medications ordered for procedure: Meds ordered this encounter  Medications  . iopamidol (ISOVUE-M) 41 % intrathecal injection 10 mL    Must be Myelogram-compatible. If not available, you may substitute with a water-soluble, non-ionic, hypoallergenic, myelogram-compatible radiological contrast medium.  Marland Kitchen lidocaine (XYLOCAINE) 2 % (with pres) injection 400 mg  . midazolam (VERSED) 5 MG/5ML injection 1-2 mg    Make sure Flumazenil is available in the  pyxis when using this medication. If oversedation occurs, administer 0.2 mg IV over 15 sec. If after 45 sec no response, administer 0.2 mg again over 1 min; may repeat at 1 min intervals; not to exceed 4 doses (1 mg)  . fentaNYL (SUBLIMAZE) injection 25-50 mcg    Make sure Narcan is available in the pyxis when using this medication. In the event of respiratory depression (RR< 8/min): Titrate NARCAN (naloxone) in increments of 0.1  to 0.2 mg IV at 2-3 minute intervals, until desired degree of reversal.  . lactated ringers infusion 1,000 mL  . sodium chloride flush (NS) 0.9 % injection 1 mL  . ropivacaine (PF) 2 mg/mL (0.2%) (NAROPIN) injection 1 mL  . dexamethasone (DECADRON) injection 10 mg  . sodium chloride flush (NS) 0.9 % injection 1 mL  . ropivacaine (PF) 2 mg/mL (0.2%) (NAROPIN) injection 1 mL  . dexamethasone (DECADRON) injection 10 mg   Medications administered: We administered iopamidol, lidocaine, midazolam, fentaNYL, lactated ringers, dexamethasone, sodium chloride flush, ropivacaine (PF) 2 mg/mL (0.2%), and dexamethasone.  See the medical record for exact dosing, route, and time of administration.  New Prescriptions   No medications on file   Disposition: Discharge home  Discharge Date & Time: 11/10/2017; 1042 hrs.   Physician-requested Follow-up: Return for post-procedure eval (2 wks), w/ Dr. Dossie Arbour.  Future Appointments  Date Time Provider [place]  12/02/2017  8:15 AM Milinda Pointer, MD [facility] None   Primary Care Physician: [facility] [place] Location: [facility] Note by: Gaspar Cola, MD Date: 11/10/2017; Time: 11:07 AM  Disclaimer:  Medicine is not an Chief Strategy Officer. The only guarantee in medicine is that nothing is guaranteed. It is important to note that the decision to proceed with this intervention was based on the information collected from the patient. The Data and conclusions were drawn from the  patient's questionnaire, the interview, and the physical examination. Because the information was provided in large part by the patient, it cannot be guaranteed that it has not been purposely or unconsciously manipulated. Every effort has been made to obtain as much relevant data as possible for this evaluation. It is important to note that the conclusions that lead to this procedure are derived in large part from the available data. Always take into account that the treatment will also be dependent on availability of resources and existing treatment guidelines, considered by other Pain Management Practitioners as being common knowledge and practice, at the time of the intervention. For Medico-Legal purposes, it is also important to point out that variation in procedural techniques and pharmacological choices are the acceptable norm. The indications, contraindications, technique, and results of the above procedure should only be interpreted and judged by a Board-Certified Interventional Pain Specialist with extensive familiarity and expertise in the same exact procedure and technique.

## 2017-11-10 NOTE — Patient Instructions (Signed)

## 2017-11-11 ENCOUNTER — Telehealth: Payer: Self-pay

## 2017-11-11 NOTE — Telephone Encounter (Signed)
Post procedure phone call.  Patient states he is doing good.  

## 2017-12-01 NOTE — Progress Notes (Signed)
Patient's Name: Johnny Williams  MRN: [id number]  Referring Provider: [facility]  DOB: [birth year]  PCP: [facility]  DOS: 12/02/2017  Note by: Gaspar Cola, MD  Service setting: Ambulatory outpatient  Specialty: Interventional Pain Management  Location: [facility] (AMB) Pain Management Facility    Patient type: Established   Primary Reason(s) for Visit: Encounter for post-procedure evaluation of chronic illness with mild to moderate exacerbation CC: Groin Pain  HPI  Johnny Williams is a 57 y.o. year old, male patient, who comes today for a post-procedure evaluation. 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 His primarily concern today is the Groin Pain  Pain Assessment: Location: Left Groin Radiating: radiates down left leg to mid thigh in the front Onset: More than a month ago Duration: Chronic pain Quality: Aching, Sharp, Constant Severity: 5 /10 (subjective, self-reported pain score)  Note:  Reported level is inconsistent with clinical observations. Clinically the patient looks like a 3/10 A 3/10 is viewed as "Moderate" and described as significantly interfering with activities of daily living (ADL). It becomes difficult to feed, bathe, get dressed, get on and off the toilet or to perform personal hygiene functions. Difficult to get in and out of bed or a chair without assistance. Very distracting. With effort, it can be ignored when deeply involved in activities. Information on the proper use of the pain scale provided to the patient today. When using our objective Pain Scale, levels between 6 and 10/10 are said to belong in an emergency room, as it progressively worsens from a 6/10, described as severely limiting, requiring emergency care not usually available at an outpatient pain management facility. At a 6/10 level, communication becomes difficult and requires great effort. Assistance to reach the emergency department may be required. Facial flushing and profuse sweating along with potentially dangerous increases in heart rate and blood pressure will be evident. Effect on ADL: has to pace self Timing: Constant Modifying factors: nothing BP: 136/86  HR: 84  Johnny Williams comes in today for post-procedure evaluation after the treatment done on 11/11/2017. The patient continues to experience pain in the area of the left hip despite the diagnostic left sided L4 and L5 transforaminal epidural steroid injections.  Further details on both, my assessment(s), as well as the proposed treatment plan, please see below.  Post-Procedure Assessment  11/10/2017 Procedure: Diagnostic left sided L4 and L5 transforaminal ESI #1 under fluoroscopic guidance and IV sedation  Pre-procedure pain score:  5/10 Post-procedure pain score: 0/10 (100% relief) Influential Factors: BMI: 37.88 kg/m Intra-procedural challenges: None observed.         Assessment challenges: None detected.              Reported  side-effects: None.  Post-procedural adverse reactions or complications: None reported         Sedation: Sedation provided. When no sedatives are used, the analgesic levels obtained are directly associated to the effectiveness of the local anesthetics. However, when sedation is provided, the level of analgesia obtained during the initial 1 hour following the intervention, is believed to be the result of a combination of factors. These factors may include, but are not limited to: 1. The effectiveness of the local anesthetics used. 2. The effects of the analgesic(s) and/or anxiolytic(s) used. 3. The degree of discomfort experienced by the patient at the time of the procedure. 4. The patients ability and reliability in recalling and recording the events. 5. The presence and influence of possible secondary gains and/or psychosocial factors. Reported result: Relief experienced during the 1st hour after the procedure: 10 % (Ultra-Short Term Relief)            Interpretative annotation: Clinically appropriate result. Analgesia during this period is likely to be Local Anesthetic and/or IV Sedative (Analgesic/Anxiolytic) related.          Effects of local anesthetic: The analgesic effects attained during this period are directly associated to the localized infiltration of local anesthetics and therefore cary significant diagnostic value as to the etiological location, or anatomical origin, of the pain. Expected duration of relief is directly dependent on the pharmacodynamics of the local anesthetic used. Long-acting (4-6 hours) anesthetics used.  Reported result: Relief during the next 4 to 6 hour after the procedure: 75 % (Short-Term Relief)            Interpretative annotation: Clinically appropriate result. Analgesia during this period is likely to be Local Anesthetic-related.          Long-term benefit: Defined as the period of time past the expected duration of local anesthetics (1 hour for  short-acting and 4-6 hours for long-acting). With the possible exception of prolonged sympathetic blockade from the local anesthetics, benefits during this period are typically attributed to, or associated with, other factors such as analgesic sensory neuropraxia, antiinflammatory effects, or beneficial biochemical changes provided by agents other than the local anesthetics.  Reported result: Extended relief following procedure: 0 % (Long-Term Relief)            Interpretative annotation: Clinically possible results. No benefit. Therapeutic failure. Persistent algesic mechanism detected.          Current benefits: Defined as reported results that persistent at this point in time.   Analgesia: 0 %            Function: Back to baseline ROM: Back to baseline Interpretative annotation: Recurrence of symptoms. No permanent benefit expected. Effective diagnostic intervention.          Interpretation: Results would suggest a successful diagnostic intervention.                  Plan:  Re-assessment of algesic etiology. From the above results, it would seem that the patient's pain is still coming from the hip joint itself. He continues to have pain in the groin area and medial portion of the leg especially when he internally rotates the hip joint and then this pain seems to transfer to the lateral portion of the hip area with external rotation of the hip joint.          Laboratory Chemistry  Inflammation Markers (CRP: Acute Phase) (ESR: Chronic Phase) Lab Results  Component Value Date   CRP 1.0 09/29/2017   ESRSEDRATE 7 09/29/2017  Renal Markers Lab Results  Component Value Date   BUN 14 09/29/2017   CREATININE 0.91 09/29/2017   BCR 15 09/29/2017   GFRAA 108 09/29/2017   GFRNONAA 93 09/29/2017                             Hepatic Markers Lab Results  Component Value Date   AST 16 09/29/2017   ALBUMIN 4.8 09/29/2017                        Note: Lab results  reviewed.  Recent Diagnostic Imaging Results  DG C-Arm 1-60 Min-No Report Fluoroscopy was utilized by the requesting physician.  No radiographic  interpretation.   Complexity Note: I personally reviewed the fluoroscopic imaging of the procedure.                        Meds   Current Outpatient Medications:  .  acetaminophen (TYLENOL) 325 MG tablet, Take 650 mg by mouth every 6 (six) hours as needed., Disp: , Rfl:  .  Aspirin-Salicylamide-Caffeine (ARTHRITIS STRENGTH BC POWDER PO), Take by mouth., Disp: , Rfl:  .  atorvastatin (LIPITOR) 40 MG tablet, Take 40 mg by mouth daily., Disp: , Rfl:  .  Calcium Carbonate-Vit D-Min (GNP CALCIUM 1200) 1200-1000 MG-UNIT CHEW, Chew 1,200 mg by mouth daily with breakfast. Take in combination with vitamin D and magnesium., Disp: 30 tablet, Rfl: 5 .  Cholecalciferol (VITAMIN D3) 5000 units CAPS, Take 1 capsule (5,000 Units total) by mouth daily with breakfast. Take along with calcium and magnesium., Disp: 30 capsule, Rfl: 5 .  cyanocobalamin ([employer] VITAMIN B12) 2000 MCG tablet, Take 1 tablet (2,000 mcg total) by mouth daily., Disp: 90 tablet, Rfl: 0 .  diclofenac sodium (VOLTAREN) 1 % GEL, Apply topically 4 (four) times daily., Disp: , Rfl:  .  hydrochlorothiazide (HYDRODIURIL) 25 MG tablet, Take 25 mg by mouth daily., Disp: , Rfl:  .  Magnesium 500 MG CAPS, Take 1 capsule (500 mg total) by mouth 2 (two) times daily at 8 am and 10 pm., Disp: 60 capsule, Rfl: 5 .  Multiple Vitamins-Minerals (ONE-A-DAY 50 PLUS PO), Take by mouth., Disp: , Rfl:  .  omeprazole (PRILOSEC) 20 MG capsule, Take 20 mg by mouth daily., Disp: , Rfl:  .  varenicline (CHANTIX) 1 MG tablet, Take 1 mg by mouth 2 (two) times daily., Disp: , Rfl:  .  verapamil (VERELAN PM) 240 MG 24 hr capsule, Take 240 mg by mouth at bedtime., Disp: , Rfl:   ROS  Constitutional: Denies any fever or chills Gastrointestinal: No reported hemesis, hematochezia, vomiting, or acute GI distress Musculoskeletal:  Denies any acute onset joint swelling, redness, loss of ROM, or weakness Neurological: No reported episodes of acute onset apraxia, aphasia, dysarthria, agnosia, amnesia, paralysis, loss of coordination, or loss of consciousness  Allergies  Mr. Simkin is allergic to fluoxetine.  PFSH  Drug: Mr. Krinke  has no drug history on file. Alcohol:  reports that he drank alcohol. Tobacco:  reports that he has been smoking.  He has a 80.00 pack-year smoking history. He has never used smokeless tobacco. Medical:  has a past medical history of Arthritis, Hyperlipidemia, Hypertension, Joint ache, and Reflux esophagitis. Surgical: Mr. Sexson  has no past surgical history on file. Family: family history includes Heart disease in his mother; Hypertension in his mother.  Constitutional Exam  General appearance: Well  nourished, well developed, and well hydrated. In no apparent acute distress Vitals:   12/02/17 0844 12/02/17 0845  BP:  136/86  Pulse:  84  Resp:  18  Temp:  98 F (36.7 C)  SpO2:  97%  Weight: 295 lb (133.8 kg)   Height: _0  (1.88 m)    BMI Assessment: Estimated body mass index is 37.88 kg/m as calculated from the following:   Height as of this encounter: _1  (1.88 m).   Weight as of this encounter: 295 lb (133.8 kg).  BMI interpretation table: BMI level Category Range association with higher incidence of chronic pain  <18 kg/m2 Underweight   18.5-24.9 kg/m2 Ideal body weight   25-29.9 kg/m2 Overweight Increased incidence by 20%  30-34.9 kg/m2 Obese (Class I) Increased incidence by 68%  35-39.9 kg/m2 Severe obesity (Class II) Increased incidence by 136%  >40 kg/m2 Extreme obesity (Class III) Increased incidence by 254%   Patient's current BMI Ideal Body weight  Body mass index is 37.88 kg/m. Ideal body weight: 82.2 kg (181 lb 3.5 oz) Adjusted ideal body weight: 102.8 kg (226 lb 11.7 oz)   BMI Readings from Last 4 Encounters:  12/02/17 37.88 kg/m  11/10/17 35.95 kg/m   11/09/17 35.92 kg/m  10/27/17 37.23 kg/m   Wt Readings from Last 4 Encounters:  12/02/17 295 lb (133.8 kg)  11/10/17 280 lb (127 kg)  11/09/17 279 lb 12.8 oz (126.9 kg)  10/27/17 290 lb (131.5 kg)  Psych/Mental status: Alert, oriented x 3 (person, place, & time)       Eyes: PERLA Respiratory: No evidence of acute respiratory distress  Cervical Spine Area Exam  Skin & Axial Inspection: No masses, redness, edema, swelling, or associated skin lesions Alignment: Symmetrical Functional ROM: Unrestricted ROM      Stability: No instability detected Muscle Tone/Strength: Functionally intact. No obvious neuro-muscular anomalies detected. Sensory (Neurological): Unimpaired Palpation: No palpable anomalies              Upper Extremity (UE) Exam    Side: Right upper extremity  Side: Left upper extremity  Skin & Extremity Inspection: Skin color, temperature, and hair growth are WNL. No peripheral edema or cyanosis. No masses, redness, swelling, asymmetry, or associated skin lesions. No contractures.  Skin & Extremity Inspection: Skin color, temperature, and hair growth are WNL. No peripheral edema or cyanosis. No masses, redness, swelling, asymmetry, or associated skin lesions. No contractures.  Functional ROM: Unrestricted ROM          Functional ROM: Unrestricted ROM          Muscle Tone/Strength: Functionally intact. No obvious neuro-muscular anomalies detected.  Muscle Tone/Strength: Functionally intact. No obvious neuro-muscular anomalies detected.  Sensory (Neurological): Unimpaired          Sensory (Neurological): Unimpaired          Palpation: No palpable anomalies              Palpation: No palpable anomalies              Provocative Test(s):  Phalen's test: deferred Tinel's test: deferred Apley's scratch test (touch opposite shoulder):  Action 1 (Across chest): deferred Action 2 (Overhead): deferred Action 3 (LB reach): deferred   Provocative Test(s):  Phalen's test:  deferred Tinel's test: deferred Apley's scratch test (touch opposite shoulder):  Action 1 (Across chest): deferred Action 2 (Overhead): deferred Action 3 (LB reach): deferred    Thoracic Spine Area Exam  Skin & Axial Inspection: No masses, redness, or  swelling Alignment: Symmetrical Functional ROM: Unrestricted ROM Stability: No instability detected Muscle Tone/Strength: Functionally intact. No obvious neuro-muscular anomalies detected. Sensory (Neurological): Unimpaired Muscle strength & Tone: No palpable anomalies  Lumbar Spine Area Exam  Skin & Axial Inspection: No masses, redness, or swelling Alignment: Symmetrical Functional ROM: Unrestricted ROM       Stability: No instability detected Muscle Tone/Strength: Functionally intact. No obvious neuro-muscular anomalies detected. Sensory (Neurological): Unimpaired Palpation: No palpable anomalies       Provocative Tests: Lumbar Hyperextension/rotation test: deferred today       Lumbar quadrant test (Kemp's test): deferred today       Lumbar Lateral bending test: deferred today       Patrick's Maneuver: deferred today                   FABER test: deferred today       Thigh-thrust test: deferred today       S-I compression test: deferred today       S-I distraction test: deferred today        Gait & Posture Assessment  Ambulation: Unassisted Gait: Relatively normal for age and body habitus Posture: WNL   Lower Extremity Exam    Side: Right lower extremity  Side: Left lower extremity  Stability: No instability observed          Stability: No instability observed          Skin & Extremity Inspection: Skin color, temperature, and hair growth are WNL. No peripheral edema or cyanosis. No masses, redness, swelling, asymmetry, or associated skin lesions. No contractures.  Skin & Extremity Inspection: Skin color, temperature, and hair growth are WNL. No peripheral edema or cyanosis. No masses, redness, swelling, asymmetry, or  associated skin lesions. No contractures.  Functional ROM: Unrestricted ROM                  Functional ROM: Decreased ROM for hip joint          Muscle Tone/Strength: Functionally intact. No obvious neuro-muscular anomalies detected.  Muscle Tone/Strength: Functionally intact. No obvious neuro-muscular anomalies detected.  Sensory (Neurological): Unimpaired  Sensory (Neurological): Arthropathic arthralgia  Palpation: No palpable anomalies  Palpation: Complains of area being tender to palpation   Assessment  Primary Diagnosis & Pertinent Problem List: The primary encounter diagnosis was Chronic hip pain (Primary Area of Pain) (Bilateral) (L>R). 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  Status Diagnosis  Persistent Resolved Improved 1. Chronic hip pain (Primary Area of Pain) (Bilateral) (L>R)   2. Left lumbar radiculitis   3. 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 Chronic lower extremity pain (Tertiary Area of Pain) (Bilateral) (L>R)     Problems updated and reviewed during this visit: No problems updated. Plan of Care  Pharmacotherapy (Medications Ordered): No orders of the defined types were placed in this encounter.  Medications administered today: Kartik Fernando had no medications administered during this visit.  Procedure Orders    No procedure(s) ordered today   Lab Orders  No laboratory test(s) ordered today    Imaging Orders     MR HIP LEFT WO CONTRAST Referral Orders  No referral(s) requested today   Interventional management options: Planned, scheduled, and/or pending:   The patient will be sent to have an MRI  of the left hip. If there are no mechanical problems with the hip, we will either attempt a second hip injection, using an anterior  approach versus doing a diagnostic femoral nerve block + obturator nerve block. Today I have explained in detail this plan to the patient and he agrees with it.    Considering:   Diagnosticbilateral lumbar facet nerve block  Possiblebilateral bilateral lumbar facet RFA  Diagnosticleft sided LESI  Diagnosticleft intra-articular hip injection  Diagnosticleft intra-articular knee injection  Possible bilateral intra-articular Hyalgan knee injection series  Diagnosticleft genicular nerve block  Possibleleft genicular nerve artifact    Palliative PRN treatment(s):   None at this time   Provider-requested follow-up: Return for F/U eval (after test completion), w/ Dr. Dossie Arbour.  No future appointments. Primary Care Physician: [facility] [place] Location: [facility] Outpatient Pain Management Facility Note by: Gaspar Cola, MD Date: 12/02/2017; Time: 10:14 AM

## 2017-12-02 ENCOUNTER — Encounter: Payer: Self-pay | Admitting: Pain Medicine

## 2017-12-02 ENCOUNTER — Other Ambulatory Visit: Payer: Self-pay

## 2017-12-02 ENCOUNTER — Ambulatory Visit: Payer: Medicaid Other | Attending: Pain Medicine | Admitting: Pain Medicine

## 2017-12-02 VITALS — BP 136/86 | HR 84 | Temp 98.0°F | Resp 18 | Ht 74.0 in | Wt 295.0 lb

## 2017-12-02 DIAGNOSIS — M5442 Lumbago with sciatica, left side: Secondary | ICD-10-CM

## 2017-12-02 DIAGNOSIS — R1030 Lower abdominal pain, unspecified: Secondary | ICD-10-CM | POA: Diagnosis present

## 2017-12-02 DIAGNOSIS — Z79899 Other long term (current) drug therapy: Secondary | ICD-10-CM | POA: Insufficient documentation

## 2017-12-02 DIAGNOSIS — M5116 Intervertebral disc disorders with radiculopathy, lumbar region: Secondary | ICD-10-CM | POA: Diagnosis not present

## 2017-12-02 DIAGNOSIS — M25551 Pain in right hip: Secondary | ICD-10-CM

## 2017-12-02 DIAGNOSIS — M79604 Pain in right leg: Secondary | ICD-10-CM

## 2017-12-02 DIAGNOSIS — E785 Hyperlipidemia, unspecified: Secondary | ICD-10-CM | POA: Insufficient documentation

## 2017-12-02 DIAGNOSIS — M79605 Pain in left leg: Secondary | ICD-10-CM

## 2017-12-02 DIAGNOSIS — M5416 Radiculopathy, lumbar region: Secondary | ICD-10-CM

## 2017-12-02 DIAGNOSIS — Z7982 Long term (current) use of aspirin: Secondary | ICD-10-CM | POA: Diagnosis not present

## 2017-12-02 DIAGNOSIS — G894 Chronic pain syndrome: Secondary | ICD-10-CM | POA: Insufficient documentation

## 2017-12-02 DIAGNOSIS — M25552 Pain in left hip: Secondary | ICD-10-CM | POA: Diagnosis not present

## 2017-12-02 DIAGNOSIS — M160 Bilateral primary osteoarthritis of hip: Secondary | ICD-10-CM | POA: Diagnosis not present

## 2017-12-02 DIAGNOSIS — K210 Gastro-esophageal reflux disease with esophagitis: Secondary | ICD-10-CM | POA: Diagnosis not present

## 2017-12-02 DIAGNOSIS — Z87891 Personal history of nicotine dependence: Secondary | ICD-10-CM | POA: Diagnosis not present

## 2017-12-02 DIAGNOSIS — F419 Anxiety disorder, unspecified: Secondary | ICD-10-CM | POA: Insufficient documentation

## 2017-12-02 DIAGNOSIS — K222 Esophageal obstruction: Secondary | ICD-10-CM | POA: Insufficient documentation

## 2017-12-02 DIAGNOSIS — I10 Essential (primary) hypertension: Secondary | ICD-10-CM | POA: Diagnosis not present

## 2017-12-02 DIAGNOSIS — J449 Chronic obstructive pulmonary disease, unspecified: Secondary | ICD-10-CM | POA: Insufficient documentation

## 2017-12-02 DIAGNOSIS — G8929 Other chronic pain: Secondary | ICD-10-CM

## 2017-12-02 DIAGNOSIS — E119 Type 2 diabetes mellitus without complications: Secondary | ICD-10-CM | POA: Insufficient documentation

## 2017-12-02 NOTE — Progress Notes (Signed)
Safety precautions to be maintained throughout the outpatient stay will include: orient to surroundings, keep bed in low position, maintain call bell within reach at all times, provide assistance with transfer out of bed and ambulation.  

## 2017-12-08 ENCOUNTER — Telehealth: Payer: Self-pay | Admitting: *Deleted

## 2017-12-16 ENCOUNTER — Ambulatory Visit: Payer: Medicaid Other | Admitting: Pain Medicine

## 2017-12-17 ENCOUNTER — Ambulatory Visit
Admission: RE | Admit: 2017-12-17 | Discharge: 2017-12-17 | Disposition: A | Discharge status: Home / Self Care | Payer: Medicaid Other | Source: Ambulatory Visit | Attending: Pain Medicine | Admitting: Pain Medicine

## 2017-12-17 DIAGNOSIS — M160 Bilateral primary osteoarthritis of hip: Secondary | ICD-10-CM | POA: Insufficient documentation

## 2017-12-17 DIAGNOSIS — M25552 Pain in left hip: Secondary | ICD-10-CM | POA: Insufficient documentation

## 2017-12-17 DIAGNOSIS — M25451 Effusion, right hip: Secondary | ICD-10-CM | POA: Diagnosis not present

## 2017-12-17 DIAGNOSIS — M25551 Pain in right hip: Secondary | ICD-10-CM | POA: Insufficient documentation

## 2017-12-17 DIAGNOSIS — G8929 Other chronic pain: Secondary | ICD-10-CM | POA: Diagnosis present

## 2017-12-17 DIAGNOSIS — M25452 Effusion, left hip: Secondary | ICD-10-CM | POA: Insufficient documentation

## 2017-12-30 ENCOUNTER — Ambulatory Visit: Payer: Medicaid Other | Attending: Pain Medicine | Admitting: Pain Medicine

## 2017-12-30 ENCOUNTER — Other Ambulatory Visit: Payer: Self-pay

## 2017-12-30 ENCOUNTER — Encounter: Payer: Self-pay | Admitting: Pain Medicine

## 2017-12-30 VITALS — BP 133/88 | HR 91 | Temp 98.3°F | Ht 73.0 in | Wt 297.0 lb

## 2017-12-30 DIAGNOSIS — Z9889 Other specified postprocedural states: Secondary | ICD-10-CM | POA: Diagnosis not present

## 2017-12-30 DIAGNOSIS — M25562 Pain in left knee: Secondary | ICD-10-CM

## 2017-12-30 DIAGNOSIS — I10 Essential (primary) hypertension: Secondary | ICD-10-CM | POA: Diagnosis not present

## 2017-12-30 DIAGNOSIS — M25561 Pain in right knee: Secondary | ICD-10-CM

## 2017-12-30 DIAGNOSIS — M166 Other bilateral secondary osteoarthritis of hip: Secondary | ICD-10-CM

## 2017-12-30 DIAGNOSIS — G894 Chronic pain syndrome: Secondary | ICD-10-CM | POA: Diagnosis not present

## 2017-12-30 DIAGNOSIS — E785 Hyperlipidemia, unspecified: Secondary | ICD-10-CM | POA: Insufficient documentation

## 2017-12-30 DIAGNOSIS — Z79899 Other long term (current) drug therapy: Secondary | ICD-10-CM | POA: Diagnosis not present

## 2017-12-30 DIAGNOSIS — E119 Type 2 diabetes mellitus without complications: Secondary | ICD-10-CM | POA: Insufficient documentation

## 2017-12-30 DIAGNOSIS — Z7982 Long term (current) use of aspirin: Secondary | ICD-10-CM | POA: Diagnosis not present

## 2017-12-30 DIAGNOSIS — F419 Anxiety disorder, unspecified: Secondary | ICD-10-CM | POA: Diagnosis not present

## 2017-12-30 DIAGNOSIS — M79605 Pain in left leg: Secondary | ICD-10-CM | POA: Diagnosis not present

## 2017-12-30 DIAGNOSIS — M5137 Other intervertebral disc degeneration, lumbosacral region: Secondary | ICD-10-CM | POA: Diagnosis not present

## 2017-12-30 DIAGNOSIS — M5442 Lumbago with sciatica, left side: Secondary | ICD-10-CM

## 2017-12-30 DIAGNOSIS — K222 Esophageal obstruction: Secondary | ICD-10-CM | POA: Insufficient documentation

## 2017-12-30 DIAGNOSIS — K210 Gastro-esophageal reflux disease with esophagitis: Secondary | ICD-10-CM | POA: Diagnosis not present

## 2017-12-30 DIAGNOSIS — M5116 Intervertebral disc disorders with radiculopathy, lumbar region: Secondary | ICD-10-CM | POA: Insufficient documentation

## 2017-12-30 DIAGNOSIS — M25552 Pain in left hip: Secondary | ICD-10-CM

## 2017-12-30 DIAGNOSIS — M51379 Other intervertebral disc degeneration, lumbosacral region without mention of lumbar back pain or lower extremity pain: Secondary | ICD-10-CM

## 2017-12-30 DIAGNOSIS — J449 Chronic obstructive pulmonary disease, unspecified: Secondary | ICD-10-CM | POA: Diagnosis not present

## 2017-12-30 DIAGNOSIS — G8929 Other chronic pain: Secondary | ICD-10-CM | POA: Diagnosis present

## 2017-12-30 DIAGNOSIS — M79604 Pain in right leg: Secondary | ICD-10-CM | POA: Diagnosis not present

## 2017-12-30 DIAGNOSIS — M25551 Pain in right hip: Secondary | ICD-10-CM | POA: Diagnosis not present

## 2017-12-30 DIAGNOSIS — Z87891 Personal history of nicotine dependence: Secondary | ICD-10-CM | POA: Diagnosis not present

## 2017-12-30 NOTE — Progress Notes (Signed)
Patient's Name: Johnny Williams  MRN: [id number]  Referring Provider: [facility], [place]*  DOB: 17-Sep-1960  PCP: Center, [place]: 12/30/2017  Note by: Gaspar Cola, MD  Service setting: Ambulatory outpatient  Specialty: Interventional Pain Management  Location: [facility] (AMB) Pain Management Facility    Patient type: Established   Primary Reason(s) for Visit: Evaluation of chronic illnesses with exacerbation, or progression (Level of risk: moderate) CC: Hip Pain  HPI  Johnny Williams is a 57 y.o. year old, male patient, who comes today for a follow-up evaluation. 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 Johnny Williams was last seen on 12/02/2017. His primarily concern today is the Hip Pain  Pain Assessment: Location: Left Hip Radiating: pain radiates sometime down right leg Onset: More than a month ago Duration: Chronic pain Quality: Aching, Sharp Severity: 5 /10 (subjective, self-reported pain score)   Note: Reported level is inconsistent with clinical observations. Clinically the patient looks like a 3/10 A 3/10 is viewed as "Moderate" and described as significantly interfering with activities of daily living (ADL). It becomes difficult to feed, bathe, get dressed, get on and off the toilet or to perform personal hygiene functions. Difficult to get in and out of bed or a chair without assistance. Very distracting. With effort, it can be ignored when deeply involved in activities. Johnny Williams does not seem to understand the use of our objective pain scale When using our objective Pain Scale, levels between 6 and 10/10 are said to belong in an emergency room, as it progressively worsens from a 6/10, described as severely limiting, requiring emergency care not usually available at an outpatient pain management facility. At a 6/10 level, communication becomes difficult and requires great effort. Assistance to reach the emergency department may be required. Facial flushing and profuse sweating along with potentially dangerous increases in heart rate and blood pressure will be evident. Effect on ADL: limits daily activites Timing: Constant Modifying factors: lay down BP: 133/88  HR: 91  Today we went over the results of his hip MRI. I have provided patient with a printed copy of those results. The MRI would suggest severe degeneration of both hip joints with the right side being bone-on-bone. It is my impression that this patient needs to have an orthopedic evaluation for bilateral hip replacement. Because he did not obtain any long-term benefit from the intra-articular hip joint injection, it is not likely that he would benefit from more of those. He indicates that he has seen some orthopedics surgeons in the past that have instructed him to lose weight and quit smoking before he can have the surgery. Today I went over the reasons why he should quit smoking but he refers that he used to weigh over 300  pounds and  he has come down significantly. Although I agree that he should quit smoking, I am not an orthopedic surgeon and therefore I believe that he deserves a full evaluation by a surgeon that can best provide him with his possible alternatives including the risks and possible complications associated with his particular case. In terms of what I can do for this patient, should the patient not be a good surgical candidate, I have explained to him that we may be able to do some diagnostic femoral + obturator nerve blocks to determine if during the time that the local anesthetic is active he gets any benefit. If he does, he may be an adequate candidate for radiofrequency ablation of those nerves. I have explained to the patient that this is a less than ideal choice since it would do nothing for the mechanics of the joint, only the algesic part. I also pointed out that this alternative, in the best case scenario, making him temporary relief of the pain and may need to be repeated as the RFA wears out.  Further details on both, my assessment(s), as well as the proposed treatment plan, please see below.  Laboratory Chemistry  Inflammation Markers (CRP: Acute Phase) (ESR: Chronic Phase) Lab Results  Component Value Date   CRP 1.0 09/29/2017   ESRSEDRATE 7 09/29/2017                         Renal Function Markers Lab Results  Component Value Date   BUN 14 09/29/2017   CREATININE 0.91 09/29/2017   BCR 15 09/29/2017   GFRAA 108 09/29/2017   GFRNONAA 93 09/29/2017                             Hepatic Function Markers Lab Results  Component Value Date   AST 16 09/29/2017   ALBUMIN 4.8 09/29/2017   ALKPHOS 67 09/29/2017                        Electrolytes Lab Results  Component Value Date   NA 144 09/29/2017   K 4.1 09/29/2017   CL 102 09/29/2017   CALCIUM 9.8 09/29/2017   MG 1.9 09/29/2017                        Neuropathy Markers Lab Results  Component Value Date   VITAMINB12 606 09/29/2017                         Bone Pathology Markers Lab Results  Component Value Date   25OHVITD1 27 (L) 09/29/2017   25OHVITD2 <1.0 09/29/2017   25OHVITD3 27 09/29/2017                         Note: Lab results reviewed.  Recent Diagnostic Imaging Review  Lumbosacral Imaging: Lumbar DG Bending views:  Results for orders placed during the hospital encounter of 10/12/17  DG Lumbar Spine Complete W/Bend   Narrative CLINICAL DATA:  57 year old male with chronic lower extremity pain. Initial encounter.  EXAM: LUMBAR SPINE - COMPLETE WITH BENDING VIEWS  COMPARISON:  11/18/2012 lumbar spine plain film exam.  FINDINGS: Curvature lower thoracic and lumbar spine convex right at the thoracolumbar junction and convex left mid to lower lumbar spine.  Mild to moderate disc degeneration and disc space narrowing  T11-12 through L4-5. Marked L5-S1 disc degeneration and disc space narrowing.  No abnormal motion between flexion and extension.  Vascular calcifications including aortic calcifications.  IMPRESSION: Multilevel degenerative changes throughout the lower thoracic and lumbar spine most notable L5-S1 level as detailed above.  No abnormal motion between flexion and extension.  Aortic Atherosclerosis (ICD10-I70.0).   Electronically Signed   By: Genia Del M.D.   On: 10/12/2017 12:12    Hip Imaging: Hip-L MR wo contrast:  Results for orders placed during the hospital encounter of 12/17/17  MR HIP LEFT WO CONTRAST   Narrative CLINICAL DATA:  Bilateral hip pain, left worse than right.  EXAM: MR OF THE LEFT HIP WITHOUT CONTRAST  TECHNIQUE: Multiplanar, multisequence MR imaging was performed. No intravenous contrast was administered.  COMPARISON:  None.  FINDINGS: Bones: No hip fracture, dislocation or avascular necrosis. No periosteal reaction or bone destruction. No aggressive osseous lesion.  Normal sacrum and sacroiliac joints. No SI joint widening or  erosive changes. Severe degenerative disc disease with disc height loss at L5-S1.  Articular cartilage and labrum  Articular cartilage: Full-thickness cartilage loss of the right femoral head and acetabulum with subchondral reactive marrow changes and marginal osteophytes. High-grade partial-thickness cartilage loss with areas of full-thickness cartilage loss of the left femoral head and acetabulum with subchondral reactive marrow changes and marginal osteophytes.  Labrum: Severe left labral degeneration with a tear of the superior anterior labrum. Right labral degeneration with a 16 mm superior paralabral cyst.  Joint or bursal effusion  Joint effusion: Small bilateral hip joint effusions. No SI joint effusion.  Bursae:  No bursa formation.  Muscles and tendons  Flexors: Normal.  Extensors: Normal.  Abductors: Normal.  Adductors: Normal.  Gluteals: Normal.  Hamstrings: Normal.  Other findings  Miscellaneous: No pelvic free fluid. No fluid collection or hematoma. No inguinal lymphadenopathy. No inguinal hernia.  IMPRESSION: 1. Severe osteoarthritis of bilateral hips. Bilateral joint effusions, left greater than right.  Electronically Signed   By: Kathreen Devoid   On: 12/17/2017 16:10    Hip-R DG 2-3 views:  Results for orders placed during the hospital encounter of 10/12/17  DG HIP UNILAT W OR W/O PELVIS 2-3 VIEWS RIGHT   Narrative CLINICAL DATA:  57 year old male with chronic lower extremity pain. Initial encounter.  EXAM: DG HIP (WITH OR WITHOUT PELVIS) 2-3V RIGHT  COMPARISON:  None.  FINDINGS: Marked bilateral hip joint degenerative changes. No plain film evidence of femoral head avascular necrosis or collapse.  Vascular calcifications.  IMPRESSION: Marked bilateral hip joint degenerative changes.   Electronically Signed   By: Genia Del M.D.   On: 10/12/2017 12:09    Hip-L DG 2-3 views:  Results for orders placed during the hospital  encounter of 10/12/17  DG HIP UNILAT W OR W/O PELVIS 2-3 VIEWS LEFT   Narrative CLINICAL DATA:  57 year old male with chronic pain of both lower extremities. Initial encounter.  EXAM: DG HIP (WITH OR WITHOUT PELVIS) 2-3V LEFT  COMPARISON:  None.  FINDINGS: Marked bilateral hip joint degenerative changes.  No plain film evidence of femoral head avascular necrosis or collapse.  Vascular calcifications.  IMPRESSION: Marked bilateral hip joint degenerative changes.   Electronically Signed   By: Genia Del M.D.   On: 10/12/2017 12:13    Knee Imaging: Knee-R DG 1-2 views:  Results for orders placed during the hospital encounter of 10/12/17  DG Knee 1-2 Views Right   Narrative CLINICAL DATA:  57 year old male with chronic pain both  lower extremities. No reported trauma. Initial encounter.  EXAM: RIGHT KNEE - 1-2 VIEW  COMPARISON:  None.  FINDINGS: Question minimal patellofemoral joint degenerative changes. No significant narrowing of the tibiofemoral joint space.  No fracture or dislocation.  No evidence of joint effusion or soft tissue abnormality.  IMPRESSION: Question minimal patellofemoral joint degenerative changes.   Electronically Signed   By: Genia Del M.D.   On: 10/12/2017 09:50    Knee-L DG 1-2 views:  Results for orders placed during the hospital encounter of 10/12/17  DG Knee 1-2 Views Left   Narrative CLINICAL DATA:  57 year old male with chronic pain both lower extremities. No reported trauma. Initial encounter.  EXAM: LEFT KNEE - 1-2 VIEW  COMPARISON:  None.  FINDINGS: Question minimal patellofemoral joint degenerative changes.  No significant tibiofemoral joint space narrowing.  Possibility of loose body versus spur projecting at the anterior margin of the tibial plateau on lateral view.  No fracture or dislocation.  No evidence of joint effusion.  Vascular calcifications.  IMPRESSION: Question minimal patellofemoral  joint degenerative changes.  Possibility of loose body versus spur projecting at the anterior margin of the tibial plateau on lateral view.  Vascular calcifications.   Electronically Signed   By: Genia Del M.D.   On: 10/12/2017 09:52    Complexity Note: Imaging results reviewed. Results shared with Johnny Williams, using Layman's terms.                         Meds   Current Outpatient Medications:  .  acetaminophen (TYLENOL) 325 MG tablet, Take 650 mg by mouth every 6 (six) hours as needed., Disp: , Rfl:  .  Aspirin-Salicylamide-Caffeine (ARTHRITIS STRENGTH BC POWDER PO), Take by mouth., Disp: , Rfl:  .  atorvastatin (LIPITOR) 40 MG tablet, Take 40 mg by mouth daily., Disp: , Rfl:  .  Calcium Carbonate-Vit D-Min (GNP CALCIUM 1200) 1200-1000 MG-UNIT CHEW, Chew 1,200 mg by mouth daily with breakfast. Take in combination with vitamin D and magnesium., Disp: 30 tablet, Rfl: 5 .  Cholecalciferol (VITAMIN D3) 5000 units CAPS, Take 1 capsule (5,000 Units total) by mouth daily with breakfast. Take along with calcium and magnesium., Disp: 30 capsule, Rfl: 5 .  cyanocobalamin ([employer] VITAMIN B12) 2000 MCG tablet, Take 1 tablet (2,000 mcg total) by mouth daily., Disp: 90 tablet, Rfl: 0 .  diclofenac sodium (VOLTAREN) 1 % GEL, Apply topically 4 (four) times daily., Disp: , Rfl:  .  hydrochlorothiazide (HYDRODIURIL) 25 MG tablet, Take 25 mg by mouth daily., Disp: , Rfl:  .  Magnesium 500 MG CAPS, Take 1 capsule (500 mg total) by mouth 2 (two) times daily at 8 am and 10 pm., Disp: 60 capsule, Rfl: 5 .  Multiple Vitamins-Minerals (ONE-A-DAY 50 PLUS PO), Take by mouth., Disp: , Rfl:  .  omeprazole (PRILOSEC) 20 MG capsule, Take 20 mg by mouth daily., Disp: , Rfl:  .  varenicline (CHANTIX) 1 MG tablet, Take 1 mg by mouth 2 (two) times daily., Disp: , Rfl:  .  verapamil (VERELAN PM) 240 MG 24 hr capsule, Take 240 mg by mouth at bedtime., Disp: , Rfl:   ROS  Constitutional: Denies any fever or  chills Gastrointestinal: No reported hemesis, hematochezia, vomiting, or acute GI distress Musculoskeletal: Denies any acute onset joint swelling, redness, loss of ROM, or weakness Neurological: No reported episodes of acute onset apraxia, aphasia, dysarthria, agnosia, amnesia, paralysis, loss of coordination, or loss of consciousness  Allergies  Johnny Williams is allergic to fluoxetine.  PFSH  Drug: Johnny Williams  has no drug history on file. Alcohol:  reports that he drank alcohol. Tobacco:  reports that he has been smoking.  He has a 80.00 pack-year smoking history. He has never used smokeless tobacco. Medical:  has a past medical history of Arthritis, Hyperlipidemia, Hypertension, Joint ache, and Reflux esophagitis. Surgical: Johnny Williams  has no past surgical history on file. Family: family history includes Heart disease in his mother; Hypertension in his mother.  Constitutional Exam  General appearance: Well nourished, well developed, and well hydrated. In no apparent acute distress Vitals:   12/30/17 0920  BP: 133/88  Pulse: 91  Temp: 98.3 F (36.8 C)  SpO2: 100%  Weight: 297 lb (134.7 kg)  Height: 6' 1"  (1.854 m)   BMI Assessment: Estimated body mass index is 39.18 kg/m as calculated from the following:   Height as of this encounter: 6' 1"  (1.854 m).   Weight as of this encounter: 297 lb (134.7 kg).  BMI interpretation table: BMI level Category Range association with higher incidence of chronic pain  <18 kg/m2 Underweight   18.5-24.9 kg/m2 Ideal body weight   25-29.9 kg/m2 Overweight Increased incidence by 20%  30-34.9 kg/m2 Obese (Class I) Increased incidence by 68%  35-39.9 kg/m2 Severe obesity (Class II) Increased incidence by 136%  >40 kg/m2 Extreme obesity (Class III) Increased incidence by 254%   Patient's current BMI Ideal Body weight  Body mass index is 39.18 kg/m. Ideal body weight: 79.9 kg (176 lb 2.4 oz) Adjusted ideal body weight: 101.8 kg (224 lb 7.8 oz)   BMI  Readings from Last 4 Encounters:  12/30/17 39.18 kg/m  12/02/17 37.88 kg/m  11/10/17 35.95 kg/m  11/09/17 35.92 kg/m   Wt Readings from Last 4 Encounters:  12/30/17 297 lb (134.7 kg)  12/02/17 295 lb (133.8 kg)  11/10/17 280 lb (127 kg)  11/09/17 279 lb 12.8 oz (126.9 kg)  Psych/Mental status: Alert, oriented x 3 (person, place, & time)       Eyes: PERLA Respiratory: No evidence of acute respiratory distress  Cervical Spine Area Exam  Skin & Axial Inspection: No masses, redness, edema, swelling, or associated skin lesions Alignment: Symmetrical Functional ROM: Unrestricted ROM      Stability: No instability detected Muscle Tone/Strength: Functionally intact. No obvious neuro-muscular anomalies detected. Sensory (Neurological): Unimpaired Palpation: No palpable anomalies              Upper Extremity (UE) Exam    Side: Right upper extremity  Side: Left upper extremity  Skin & Extremity Inspection: Skin color, temperature, and hair growth are WNL. No peripheral edema or cyanosis. No masses, redness, swelling, asymmetry, or associated skin lesions. No contractures.  Skin & Extremity Inspection: Skin color, temperature, and hair growth are WNL. No peripheral edema or cyanosis. No masses, redness, swelling, asymmetry, or associated skin lesions. No contractures.  Functional ROM: Unrestricted ROM          Functional ROM: Unrestricted ROM          Muscle Tone/Strength: Functionally intact. No obvious neuro-muscular anomalies detected.  Muscle Tone/Strength: Functionally intact. No obvious neuro-muscular anomalies detected.  Sensory (Neurological): Unimpaired          Sensory (Neurological): Unimpaired          Palpation: No palpable anomalies              Palpation: No palpable anomalies  Provocative Test(s):  Phalen's test: deferred Tinel's test: deferred Apley's scratch test (touch opposite shoulder):  Action 1 (Across chest): deferred Action 2 (Overhead):  deferred Action 3 (LB reach): deferred   Provocative Test(s):  Phalen's test: deferred Tinel's test: deferred Apley's scratch test (touch opposite shoulder):  Action 1 (Across chest): deferred Action 2 (Overhead): deferred Action 3 (LB reach): deferred    Thoracic Spine Area Exam  Skin & Axial Inspection: No masses, redness, or swelling Alignment: Symmetrical Functional ROM: Unrestricted ROM Stability: No instability detected Muscle Tone/Strength: Functionally intact. No obvious neuro-muscular anomalies detected. Sensory (Neurological): Unimpaired Muscle strength & Tone: No palpable anomalies  Lumbar Spine Area Exam  Skin & Axial Inspection: No masses, redness, or swelling Alignment: Symmetrical Functional ROM: Decreased ROM       Stability: No instability detected Muscle Tone/Strength: Functionally intact. No obvious neuro-muscular anomalies detected. Sensory (Neurological): Unimpaired Palpation: No palpable anomalies       Provocative Tests: Lumbar Hyperextension/rotation test: deferred today       Lumbar quadrant test (Kemp's test): deferred today       Lumbar Lateral bending test: deferred today       Patrick's Maneuver: (+) for bilateral hip arthralgia             FABER test: (+) for bilateral hip arthralgia Thigh-thrust test: deferred today       S-I compression test: deferred today       S-I distraction test: deferred today        Gait & Posture Assessment  Ambulation: Unassisted Gait: Antalgic Posture: WNL   Lower Extremity Exam    Side: Right lower extremity  Side: Left lower extremity  Stability: No instability observed          Stability: No instability observed          Skin & Extremity Inspection: Skin color, temperature, and hair growth are WNL. No peripheral edema or cyanosis. No masses, redness, swelling, asymmetry, or associated skin lesions. No contractures.  Skin & Extremity Inspection: Skin color, temperature, and hair growth are WNL. No peripheral  edema or cyanosis. No masses, redness, swelling, asymmetry, or associated skin lesions. No contractures.  Functional ROM: Decreased ROM for hip joint          Functional ROM: Decreased ROM for hip joint          Muscle Tone/Strength: Functionally intact. No obvious neuro-muscular anomalies detected.  Muscle Tone/Strength: Functionally intact. No obvious neuro-muscular anomalies detected.  Sensory (Neurological): Arthropathic arthralgia  Sensory (Neurological): Arthropathic arthralgia  Palpation: No palpable anomalies  Palpation: No palpable anomalies   Assessment  Primary Diagnosis & Pertinent Problem List: The primary encounter diagnosis was Chronic hip pain (Primary Area of Pain) (Bilateral) (L>R). 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  Status Diagnosis  Persistent Worsening Persistent 1. Chronic hip pain (Primary Area of Pain) (Bilateral) (L>R)   2. 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 DDD (degenerative disc disease), lumbosacral   5. Chronic lower extremity pain (Tertiary Area of Pain) (Bilateral) (L>R)   6. Chronic knee pain (Fourth Area of Pain) (Bilateral) (L>R)     Problems updated and reviewed during  this visit: No problems updated. Plan of Care  Pharmacotherapy (Medications Ordered): No orders of the defined types were placed in this encounter.  Medications administered today: Johnny Williams had no medications administered during this visit.  Procedure Orders    No procedure(s) ordered today   Lab Orders  No laboratory test(s) ordered today   Imaging Orders  No imaging studies  ordered today    Referral Orders     Ambulatory referral to Orthopedic Surgery  Interventional management options: Planned, scheduled, and/or pending:   Orthopedic surgery consult for possible hip replacement vs diagnostic femoral nerve block + obturator nerve block. Today I have explained in detail this plan to the patient and he agrees with it.   Considering:   Diagnosticbilateral lumbar facet nerve block Possiblebilateral bilateral lumbar facet RFA Diagnosticleft sided LESI Diagnosticleft intra-articular hip injection Diagnosticleft intra-articular knee injection Possible bilateral intra-articular Hyalgan knee injectionseries  Diagnosticleft genicular nerve block Possibleleft genicular nerve RFA   Palliative PRN treatment(s):   None at this time   Provider-requested follow-up: Return if symptoms worsen or fail to improve.  No future appointments. Primary Care Physician: [facility], [place] Location: [facility] Out[facility] Management Facility Note by: Gaspar Cola, MD Date: 12/30/2017; Time: 8:40 AM

## 2018-05-04 NOTE — Discharge Instructions (Signed)
Instructions after Total Hip Replacement ° ° °  Voula Waln P. Jodie Leiner, Jr., M.D.    ° Dept. of Orthopaedics & Sports Medicine ° [facility] ° [address] ° [place], [place]  [address] ° Phone: [phone]   Fax: [phone] ° °  °DIET: °• Drink plenty of non-alcoholic fluids. °• Resume your normal diet. Include foods high in fiber. ° °ACTIVITY:  °• You may use crutches or a walker with weight-bearing as tolerated, unless instructed otherwise. °• You may be weaned off of the walker or crutches by your Physical Therapist.  °• Do NOT reach below the level of your knees or cross your legs until allowed.    °• Continue doing gentle exercises. Exercising will reduce the pain and swelling, increase motion, and prevent muscle weakness.   °• Please continue to use the TED compression stockings for 6 weeks. You may remove the stockings at night, but should reapply them in the morning. °• Do not drive or operate any equipment until instructed. ° °WOUND CARE:  °• Continue to use ice packs periodically to reduce pain and swelling. °• Keep the incision clean and dry. °• You may bathe or shower after the staples are removed at the first office visit following surgery. ° °MEDICATIONS: °• You may resume your regular medications. °• Please take the pain medication as prescribed on the medication. °• Do not take pain medication on an empty stomach. °• You have been given a prescription for a blood thinner to prevent blood clots. Please take the medication as instructed. (NOTE: After completing a 2 week course of Lovenox, take one Enteric-coated aspirin once a day.) °• Pain medications and iron supplements can cause constipation. Use a stool softener (Senokot or Colace) on a daily basis and a laxative (dulcolax or miralax) as needed. °• Do not drive or drink alcoholic beverages when taking pain medications. ° °CALL THE OFFICE FOR: °• Temperature above 101 degrees °• Excessive bleeding or drainage on the dressing. °• Excessive  swelling, coldness, or paleness of the toes. °• Persistent nausea and vomiting. ° °FOLLOW-UP:  °• You should have an appointment to return to the office in 6 weeks after surgery. °• Arrangements have been made for continuation of Physical Therapy (either home therapy or outpatient therapy). °  °

## 2018-05-05 ENCOUNTER — Other Ambulatory Visit: Payer: Self-pay

## 2018-05-05 ENCOUNTER — Encounter
Admission: RE | Admit: 2018-05-05 | Discharge: 2018-05-05 | Disposition: A | Discharge status: Home / Self Care | Payer: Medicaid Other | Source: Ambulatory Visit | Attending: Orthopedic Surgery | Admitting: Orthopedic Surgery

## 2018-05-05 DIAGNOSIS — G4733 Obstructive sleep apnea (adult) (pediatric): Secondary | ICD-10-CM | POA: Insufficient documentation

## 2018-05-05 DIAGNOSIS — Z01818 Encounter for other preprocedural examination: Secondary | ICD-10-CM | POA: Insufficient documentation

## 2018-05-05 DIAGNOSIS — E785 Hyperlipidemia, unspecified: Secondary | ICD-10-CM | POA: Diagnosis not present

## 2018-05-05 DIAGNOSIS — I10 Essential (primary) hypertension: Secondary | ICD-10-CM | POA: Diagnosis not present

## 2018-05-05 HISTORY — DX: Esophageal varices without bleeding: I85.00

## 2018-05-05 HISTORY — DX: Dyspnea, unspecified: R06.00

## 2018-05-05 HISTORY — DX: Psychophysiologic insomnia: F51.04

## 2018-05-05 HISTORY — DX: Anxiety disorder, unspecified: F41.9

## 2018-05-05 HISTORY — DX: Chronic obstructive pulmonary disease, unspecified: J44.9

## 2018-05-05 HISTORY — DX: Prediabetes: R73.03

## 2018-05-05 HISTORY — DX: Gastro-esophageal reflux disease without esophagitis: K21.9

## 2018-05-05 HISTORY — DX: Sleep apnea, unspecified: G47.30

## 2018-05-05 HISTORY — DX: Esophageal obstruction: K22.2

## 2018-05-05 LAB — COMPREHENSIVE METABOLIC PANEL
ALBUMIN: 4.3 g/dL (ref 3.5–5.0)
ALK PHOS: 52 U/L (ref 38–126)
ALT: 21 U/L (ref 0–44)
ANION GAP: 11 (ref 5–15)
AST: 23 U/L (ref 15–41)
BILIRUBIN TOTAL: 0.6 mg/dL (ref 0.3–1.2)
BUN: 15 mg/dL (ref 6–20)
CALCIUM: 9.4 mg/dL (ref 8.9–10.3)
CO2: 27 mmol/L (ref 22–32)
Chloride: 104 mmol/L (ref 98–111)
Creatinine, Ser: 0.73 mg/dL (ref 0.61–1.24)
GFR calc non Af Amer: >60 mL/min (ref >60)
Glucose, Bld: 105 mg/dL — ABNORMAL HIGH (ref 70–99)
POTASSIUM: 4 mmol/L (ref 3.5–5.1)
Sodium: 142 mmol/L (ref 135–145)
TOTAL PROTEIN: 7.1 g/dL (ref 6.5–8.1)

## 2018-05-05 LAB — SURGICAL PCR SCREEN
MRSA, PCR: NEGATIVE
Staphylococcus aureus: NEGATIVE

## 2018-05-05 LAB — CBC
HEMATOCRIT: 52.1 % — AB (ref 39.0–52.0)
HEMOGLOBIN: 16.7 g/dL (ref 13.0–17.0)
MCH: 28.2 pg (ref 26.0–34.0)
MCHC: 32.1 g/dL (ref 30.0–36.0)
MCV: 87.9 fL (ref 80.0–100.0)
Platelets: 156 10*3/uL (ref 150–400)
RBC: 5.93 MIL/uL — ABNORMAL HIGH (ref 4.22–5.81)
RDW: 13.6 % (ref 11.5–15.5)
WBC: 10.9 10*3/uL — AB (ref 4.0–10.5)
nRBC: 0 % (ref 0.0–0.2)

## 2018-05-05 LAB — PROTIME-INR
INR: 1.05
Prothrombin Time: 13.6 seconds (ref 11.4–15.2)

## 2018-05-05 LAB — URINALYSIS, ROUTINE W REFLEX MICROSCOPIC
BILIRUBIN URINE: NEGATIVE
GLUCOSE, UA: NEGATIVE mg/dL
Hgb urine dipstick: NEGATIVE
KETONES UR: NEGATIVE mg/dL
LEUKOCYTES UA: NEGATIVE
Nitrite: NEGATIVE
Protein, ur: 100 mg/dL — AB
Specific Gravity, Urine: 1.035 — ABNORMAL HIGH (ref 1.005–1.030)
pH: 5 (ref 5.0–8.0)

## 2018-05-05 LAB — TYPE AND SCREEN
ABO/RH(D): B POS
Antibody Screen: NEGATIVE

## 2018-05-05 LAB — SEDIMENTATION RATE: SED RATE: 2 mm/h (ref 0–20)

## 2018-05-05 LAB — C-REACTIVE PROTEIN

## 2018-05-05 LAB — APTT: APTT: 32 s (ref 24–36)

## 2018-05-05 NOTE — Patient Instructions (Signed)
Your procedure is scheduled on: Wednesday 05/19/18 Report to [place]. To find out your arrival time please call [phone] between 1PM - 3PM on Tuesday 05/18/18.  Remember: Instructions that are not followed completely may result in serious medical risk, up to and including death, or upon the discretion of your surgeon and anesthesiologist your surgery may need to be rescheduled.     _X__ 1. Do not eat food after midnight the night before your procedure.                 No gum chewing or hard candies. You may drink clear liquids up to 2 hours                 before you are scheduled to arrive for your surgery- DO not drink clear                 liquids within 2 hours of the start of your surgery.                 Clear Liquids include:  water, apple juice without pulp, clear carbohydrate                 drink such as Clearfast or Gatorade, Black Coffee or Tea (Do not add                 anything to coffee or tea).  __X__2.  On the morning of surgery brush your teeth with toothpaste and water, you                 may rinse your mouth with mouthwash if you wish.  Do not swallow any              toothpaste of mouthwash.     _X__ 3.  No Alcohol for 24 hours before or after surgery.   _X__ 4.  Do Not Smoke or use e-cigarettes For 24 Hours Prior to Your Surgery.                 Do not use any chewable tobacco products for at least 6 hours prior to                 surgery.  ____  5.  Bring all medications with you on the day of surgery if instructed.   __X__  6.  Notify your doctor if there is any change in your medical condition      (cold, fever, infections).     Do not wear jewelry, make-up, hairpins, clips or nail polish. Do not wear lotions, powders, or perfumes.  Do not shave 48 hours prior to surgery. Men may shave face and neck. Do not bring valuables to the hospital.    [facility] is not responsible for any belongings or  valuables.  Contacts, dentures/partials or body piercings may not be worn into surgery. Bring a case for your contacts, glasses or hearing aids, a denture cup will be supplied. Leave your suitcase in the car. After surgery it may be brought to your room. For patients admitted to the hospital, discharge time is determined by your treatment team.   Patients discharged the day of surgery will not be allowed to drive home.   Please read over the following fact sheets that you were given:   MRSA Information  __X__ Take these medicines the morning of surgery with A SIP OF WATER:  1. atorvastatin (LIPITOR)  2. omeprazole (PRILOSEC)  3. verapamil (VERELAN PM)  4.  5.  6.  ____ Fleet Enema (as directed)   __X__ Use CHG Soap/SAGE wipes as directed  ____ Use inhalers on the day of surgery  ____ Stop metformin/Janumet/Farxiga 2 days prior to surgery    ____ Take 1/2 of usual insulin dose the night before surgery. No insulin the morning          of surgery.   ____ Stop Blood Thinners Coumadin/Plavix/Xarelto/Pleta/Pradaxa/Eliquis/Effient/Aspirin  on   Or contact your Surgeon, Cardiologist or Medical Doctor regarding  ability to stop your blood thinners  __X__ Stop Anti-inflammatories 7 days before surgery such as Advil, Ibuprofen, Motrin,  BC or Goodies Powder, Naprosyn, Naproxen, Aleve, Aspirin, Meloxicam    __X__ Stop all herbal supplements, fish oil or vitamin E until after surgery.    ____ Bring C-Pap to the hospital.

## 2018-05-06 LAB — URINE CULTURE
Culture: NO GROWTH
Special Requests: NORMAL

## 2018-05-06 NOTE — Pre-Procedure Instructions (Signed)
UA FAXED TO DR HOOTEN 

## 2018-05-18 MED ORDER — CEFAZOLIN SODIUM-DEXTROSE 2-4 GM/100ML-% IV SOLN
2.0000 g | INTRAVENOUS | Status: AC
  Administered 2018-05-19: 2 g via INTRAVENOUS

## 2018-05-18 MED ORDER — TRANEXAMIC ACID-NACL 1000-0.7 MG/100ML-% IV SOLN
1000.0000 mg | INTRAVENOUS | Status: AC
  Administered 2018-05-19: 1000 mg via INTRAVENOUS
  Filled 2018-05-18: qty 100

## 2018-05-19 ENCOUNTER — Other Ambulatory Visit: Payer: Self-pay

## 2018-05-19 ENCOUNTER — Inpatient Hospital Stay: Payer: Medicaid Other | Admitting: Anesthesiology

## 2018-05-19 ENCOUNTER — Encounter
Admission: RE | Disposition: A | Discharge status: Home Health | Payer: Self-pay | Source: Ambulatory Visit | Attending: Orthopedic Surgery

## 2018-05-19 ENCOUNTER — Encounter: Payer: Self-pay | Admitting: Orthopedic Surgery

## 2018-05-19 ENCOUNTER — Inpatient Hospital Stay
Admission: RE | Admit: 2018-05-19 | Discharge: 2018-05-21 | DRG: 470 | Disposition: A | Discharge status: Home Health | Payer: Medicaid Other | Source: Ambulatory Visit | Attending: Orthopedic Surgery | Admitting: Orthopedic Surgery

## 2018-05-19 ENCOUNTER — Inpatient Hospital Stay: Payer: Medicaid Other

## 2018-05-19 DIAGNOSIS — Z888 Allergy status to other drugs, medicaments and biological substances status: Secondary | ICD-10-CM | POA: Diagnosis not present

## 2018-05-19 DIAGNOSIS — I10 Essential (primary) hypertension: Secondary | ICD-10-CM | POA: Diagnosis present

## 2018-05-19 DIAGNOSIS — Z791 Long term (current) use of non-steroidal anti-inflammatories (NSAID): Secondary | ICD-10-CM

## 2018-05-19 DIAGNOSIS — G43909 Migraine, unspecified, not intractable, without status migrainosus: Secondary | ICD-10-CM | POA: Diagnosis present

## 2018-05-19 DIAGNOSIS — Z6838 Body mass index (BMI) 38.0-38.9, adult: Secondary | ICD-10-CM

## 2018-05-19 DIAGNOSIS — K210 Gastro-esophageal reflux disease with esophagitis: Secondary | ICD-10-CM | POA: Diagnosis present

## 2018-05-19 DIAGNOSIS — J449 Chronic obstructive pulmonary disease, unspecified: Secondary | ICD-10-CM | POA: Diagnosis present

## 2018-05-19 DIAGNOSIS — I8500 Esophageal varices without bleeding: Secondary | ICD-10-CM | POA: Diagnosis present

## 2018-05-19 DIAGNOSIS — M160 Bilateral primary osteoarthritis of hip: Principal | ICD-10-CM | POA: Diagnosis present

## 2018-05-19 DIAGNOSIS — G4733 Obstructive sleep apnea (adult) (pediatric): Secondary | ICD-10-CM | POA: Diagnosis present

## 2018-05-19 DIAGNOSIS — E785 Hyperlipidemia, unspecified: Secondary | ICD-10-CM | POA: Diagnosis present

## 2018-05-19 DIAGNOSIS — M25551 Pain in right hip: Secondary | ICD-10-CM | POA: Diagnosis present

## 2018-05-19 DIAGNOSIS — E119 Type 2 diabetes mellitus without complications: Secondary | ICD-10-CM | POA: Diagnosis present

## 2018-05-19 DIAGNOSIS — Z96649 Presence of unspecified artificial hip joint: Secondary | ICD-10-CM

## 2018-05-19 DIAGNOSIS — F5104 Psychophysiologic insomnia: Secondary | ICD-10-CM | POA: Diagnosis present

## 2018-05-19 HISTORY — PX: TOTAL HIP ARTHROPLASTY: SHX124

## 2018-05-19 LAB — GLUCOSE, CAPILLARY
Glucose-Capillary: 116 mg/dL — ABNORMAL HIGH (ref 70–99)
Glucose-Capillary: 126 mg/dL — ABNORMAL HIGH (ref 70–99)

## 2018-05-19 LAB — ABO/RH: ABO/RH(D): B POS

## 2018-05-19 SURGERY — ARTHROPLASTY, HIP, TOTAL,POSTERIOR APPROACH
Anesthesia: Spinal | Site: Hip | Laterality: Right

## 2018-05-19 MED ORDER — PROPOFOL 10 MG/ML IV BOLUS
INTRAVENOUS | Status: AC
  Filled 2018-05-19: qty 20

## 2018-05-19 MED ORDER — MIDAZOLAM HCL 5 MG/5ML IJ SOLN
INTRAMUSCULAR | Status: DC | PRN
  Administered 2018-05-19 (×2): 1 mg via INTRAVENOUS
  Administered 2018-05-19: 2 mg via INTRAVENOUS

## 2018-05-19 MED ORDER — SODIUM CHLORIDE 0.9 % IV SOLN
INTRAVENOUS | Status: DC | PRN
  Administered 2018-05-19: 30 ug/min via INTRAVENOUS

## 2018-05-19 MED ORDER — ONDANSETRON HCL 4 MG/2ML IJ SOLN: 4.0000 mg | Freq: Four times a day (QID) | INTRAMUSCULAR | Status: DC | PRN

## 2018-05-19 MED ORDER — HYDROMORPHONE HCL 1 MG/ML IJ SOLN: 0.5000 mg | INTRAMUSCULAR | Status: DC | PRN

## 2018-05-19 MED ORDER — HYDROCHLOROTHIAZIDE 25 MG PO TABS
25.0000 mg | ORAL_TABLET | Freq: Every day | ORAL | Status: DC
  Administered 2018-05-20: 25 mg via ORAL
  Filled 2018-05-19: qty 1

## 2018-05-19 MED ORDER — ENOXAPARIN SODIUM 30 MG/0.3ML ~~LOC~~ SOLN
30.0000 mg | Freq: Two times a day (BID) | SUBCUTANEOUS | Status: DC
  Administered 2018-05-20 – 2018-05-21 (×3): 30 mg via SUBCUTANEOUS
  Filled 2018-05-19 (×3): qty 0.3

## 2018-05-19 MED ORDER — DEXAMETHASONE SODIUM PHOSPHATE 10 MG/ML IJ SOLN
INTRAMUSCULAR | Status: AC
  Administered 2018-05-19: 8 mg via INTRAVENOUS
  Filled 2018-05-19: qty 1

## 2018-05-19 MED ORDER — CELECOXIB 200 MG PO CAPS
ORAL_CAPSULE | ORAL | Status: AC
  Administered 2018-05-19: 400 mg via ORAL
  Filled 2018-05-19: qty 2

## 2018-05-19 MED ORDER — METOCLOPRAMIDE HCL 10 MG PO TABS: 5.0000 mg | ORAL_TABLET | Freq: Three times a day (TID) | ORAL | Status: DC | PRN

## 2018-05-19 MED ORDER — MAGNESIUM HYDROXIDE 400 MG/5ML PO SUSP
30.0000 mL | Freq: Every day | ORAL | Status: DC
  Administered 2018-05-20: 30 mL via ORAL
  Filled 2018-05-19: qty 30

## 2018-05-19 MED ORDER — NEOMYCIN-POLYMYXIN B GU 40-200000 IR SOLN
Status: DC | PRN
  Administered 2018-05-19: 14 mL

## 2018-05-19 MED ORDER — OXYCODONE HCL 5 MG PO TABS: 5.0000 mg | ORAL_TABLET | ORAL | Status: DC | PRN

## 2018-05-19 MED ORDER — GABAPENTIN 300 MG PO CAPS
ORAL_CAPSULE | ORAL | Status: AC
  Administered 2018-05-19: 300 mg via ORAL
  Filled 2018-05-19: qty 1

## 2018-05-19 MED ORDER — VERAPAMIL HCL ER 240 MG PO TBCR
240.0000 mg | EXTENDED_RELEASE_TABLET | Freq: Every day | ORAL | Status: DC
  Administered 2018-05-20: 240 mg via ORAL
  Filled 2018-05-19 (×2): qty 1

## 2018-05-19 MED ORDER — BUPIVACAINE HCL (PF) 0.5 % IJ SOLN
INTRAMUSCULAR | Status: DC | PRN
  Administered 2018-05-19: 3 mL

## 2018-05-19 MED ORDER — DEXAMETHASONE SODIUM PHOSPHATE 10 MG/ML IJ SOLN
8.0000 mg | Freq: Once | INTRAMUSCULAR | Status: AC
  Administered 2018-05-19: 8 mg via INTRAVENOUS

## 2018-05-19 MED ORDER — DIPHENHYDRAMINE HCL 12.5 MG/5ML PO ELIX: 12.5000 mg | ORAL_SOLUTION | ORAL | Status: DC | PRN

## 2018-05-19 MED ORDER — SENNOSIDES-DOCUSATE SODIUM 8.6-50 MG PO TABS
1.0000 | ORAL_TABLET | Freq: Two times a day (BID) | ORAL | Status: DC
  Administered 2018-05-19 – 2018-05-21 (×4): 1 via ORAL
  Filled 2018-05-19 (×4): qty 1

## 2018-05-19 MED ORDER — FLEET ENEMA 7-19 GM/118ML RE ENEM: 1.0000 | ENEMA | Freq: Once | RECTAL | Status: DC | PRN

## 2018-05-19 MED ORDER — CHLORHEXIDINE GLUCONATE 4 % EX LIQD: 60.0000 mL | Freq: Once | CUTANEOUS | Status: DC

## 2018-05-19 MED ORDER — GABAPENTIN 300 MG PO CAPS
300.0000 mg | ORAL_CAPSULE | Freq: Every day | ORAL | Status: DC
  Administered 2018-05-19 – 2018-05-20 (×2): 300 mg via ORAL
  Filled 2018-05-19 (×2): qty 1

## 2018-05-19 MED ORDER — CEFAZOLIN SODIUM-DEXTROSE 2-4 GM/100ML-% IV SOLN
INTRAVENOUS | Status: AC
  Filled 2018-05-19: qty 100

## 2018-05-19 MED ORDER — CELECOXIB 200 MG PO CAPS
400.0000 mg | ORAL_CAPSULE | Freq: Once | ORAL | Status: AC
  Administered 2018-05-19: 400 mg via ORAL

## 2018-05-19 MED ORDER — CALCIUM CARBONATE ANTACID 500 MG PO CHEW
2.0000 | CHEWABLE_TABLET | Freq: Every day | ORAL | Status: DC
  Administered 2018-05-20 – 2018-05-21 (×2): 400 mg via ORAL
  Filled 2018-05-19 (×2): qty 2

## 2018-05-19 MED ORDER — TRAMADOL HCL 50 MG PO TABS
50.0000 mg | ORAL_TABLET | ORAL | Status: DC | PRN
  Administered 2018-05-20 (×2): 100 mg via ORAL
  Administered 2018-05-21: 50 mg via ORAL
  Filled 2018-05-19: qty 2
  Filled 2018-05-19: qty 1
  Filled 2018-05-19: qty 2

## 2018-05-19 MED ORDER — PANTOPRAZOLE SODIUM 40 MG PO TBEC
40.0000 mg | DELAYED_RELEASE_TABLET | Freq: Two times a day (BID) | ORAL | Status: DC
  Administered 2018-05-19 – 2018-05-21 (×4): 40 mg via ORAL
  Filled 2018-05-19 (×5): qty 1

## 2018-05-19 MED ORDER — CELECOXIB 200 MG PO CAPS
200.0000 mg | ORAL_CAPSULE | Freq: Two times a day (BID) | ORAL | Status: DC
  Administered 2018-05-19 – 2018-05-21 (×4): 200 mg via ORAL
  Filled 2018-05-19 (×4): qty 1

## 2018-05-19 MED ORDER — SODIUM CHLORIDE 0.9 % IV SOLN
INTRAVENOUS | Status: DC
  Administered 2018-05-19: 20:00:00 via INTRAVENOUS

## 2018-05-19 MED ORDER — MIDAZOLAM HCL 5 MG/5ML IJ SOLN
INTRAMUSCULAR | Status: AC
  Filled 2018-05-19: qty 5

## 2018-05-19 MED ORDER — FENTANYL CITRATE (PF) 100 MCG/2ML IJ SOLN
INTRAMUSCULAR | Status: AC
  Filled 2018-05-19: qty 2

## 2018-05-19 MED ORDER — BISACODYL 10 MG RE SUPP: 10.0000 mg | Freq: Every day | RECTAL | Status: DC | PRN

## 2018-05-19 MED ORDER — PHENYLEPHRINE HCL 10 MG/ML IJ SOLN
INTRAMUSCULAR | Status: DC | PRN
  Administered 2018-05-19 (×4): 100 ug via INTRAVENOUS

## 2018-05-19 MED ORDER — PROPOFOL 500 MG/50ML IV EMUL
INTRAVENOUS | Status: AC
  Filled 2018-05-19: qty 50

## 2018-05-19 MED ORDER — ONDANSETRON HCL 4 MG/2ML IJ SOLN: 4.0000 mg | Freq: Once | INTRAMUSCULAR | Status: DC | PRN

## 2018-05-19 MED ORDER — ACETAMINOPHEN 10 MG/ML IV SOLN
1000.0000 mg | Freq: Four times a day (QID) | INTRAVENOUS | Status: AC
  Administered 2018-05-19 – 2018-05-20 (×4): 1000 mg via INTRAVENOUS
  Filled 2018-05-19 (×3): qty 100

## 2018-05-19 MED ORDER — METOCLOPRAMIDE HCL 10 MG PO TABS
10.0000 mg | ORAL_TABLET | Freq: Three times a day (TID) | ORAL | Status: DC
  Administered 2018-05-19 – 2018-05-21 (×6): 10 mg via ORAL
  Filled 2018-05-19 (×6): qty 1

## 2018-05-19 MED ORDER — ACETAMINOPHEN 325 MG PO TABS: 325.0000 mg | ORAL_TABLET | Freq: Four times a day (QID) | ORAL | Status: DC | PRN

## 2018-05-19 MED ORDER — ONDANSETRON HCL 4 MG PO TABS: 4.0000 mg | ORAL_TABLET | Freq: Four times a day (QID) | ORAL | Status: DC | PRN

## 2018-05-19 MED ORDER — FENTANYL CITRATE (PF) 100 MCG/2ML IJ SOLN
INTRAMUSCULAR | Status: AC
  Administered 2018-05-19: 50 ug via INTRAVENOUS
  Filled 2018-05-19: qty 2

## 2018-05-19 MED ORDER — PROPOFOL 500 MG/50ML IV EMUL
INTRAVENOUS | Status: AC
  Filled 2018-05-19: qty 100

## 2018-05-19 MED ORDER — ACETAMINOPHEN 10 MG/ML IV SOLN
INTRAVENOUS | Status: AC
  Filled 2018-05-19: qty 100

## 2018-05-19 MED ORDER — VITAMIN D 25 MCG (1000 UNIT) PO TABS
5000.0000 [IU] | ORAL_TABLET | Freq: Every day | ORAL | Status: DC
  Administered 2018-05-20 – 2018-05-21 (×2): 5000 [IU] via ORAL
  Filled 2018-05-19 (×3): qty 5

## 2018-05-19 MED ORDER — OXYCODONE HCL 5 MG PO TABS
10.0000 mg | ORAL_TABLET | ORAL | Status: DC | PRN
  Administered 2018-05-19 – 2018-05-21 (×5): 10 mg via ORAL
  Filled 2018-05-19 (×5): qty 2

## 2018-05-19 MED ORDER — TRANEXAMIC ACID-NACL 1000-0.7 MG/100ML-% IV SOLN
1000.0000 mg | Freq: Once | INTRAVENOUS | Status: AC
  Administered 2018-05-19: 1000 mg via INTRAVENOUS
  Filled 2018-05-19: qty 100

## 2018-05-19 MED ORDER — MENTHOL 3 MG MT LOZG
1.0000 | LOZENGE | OROMUCOSAL | Status: DC | PRN
  Filled 2018-05-19: qty 9

## 2018-05-19 MED ORDER — PHENOL 1.4 % MT LIQD
1.0000 | OROMUCOSAL | Status: DC | PRN
  Filled 2018-05-19: qty 177

## 2018-05-19 MED ORDER — METOCLOPRAMIDE HCL 5 MG/ML IJ SOLN: 5.0000 mg | Freq: Three times a day (TID) | INTRAMUSCULAR | Status: DC | PRN

## 2018-05-19 MED ORDER — FERROUS SULFATE 325 (65 FE) MG PO TABS
325.0000 mg | ORAL_TABLET | Freq: Two times a day (BID) | ORAL | Status: DC
  Administered 2018-05-20 – 2018-05-21 (×3): 325 mg via ORAL
  Filled 2018-05-19 (×3): qty 1

## 2018-05-19 MED ORDER — GABAPENTIN 300 MG PO CAPS
300.0000 mg | ORAL_CAPSULE | Freq: Once | ORAL | Status: AC
  Administered 2018-05-19: 300 mg via ORAL

## 2018-05-19 MED ORDER — FENTANYL CITRATE (PF) 100 MCG/2ML IJ SOLN
25.0000 ug | INTRAMUSCULAR | Status: DC | PRN
  Administered 2018-05-19 (×2): 50 ug via INTRAVENOUS

## 2018-05-19 MED ORDER — ATORVASTATIN CALCIUM 20 MG PO TABS
40.0000 mg | ORAL_TABLET | Freq: Every day | ORAL | Status: DC
  Administered 2018-05-20 – 2018-05-21 (×2): 40 mg via ORAL
  Filled 2018-05-19 (×2): qty 2

## 2018-05-19 MED ORDER — DEXTROSE 5 % IV SOLN
3.0000 g | Freq: Four times a day (QID) | INTRAVENOUS | Status: AC
  Administered 2018-05-20 (×3): 3 g via INTRAVENOUS
  Filled 2018-05-19: qty 3000
  Filled 2018-05-19 (×2): qty 3
  Filled 2018-05-19: qty 3000
  Filled 2018-05-19: qty 3

## 2018-05-19 MED ORDER — SODIUM CHLORIDE 0.9 % IV SOLN
INTRAVENOUS | Status: DC
  Administered 2018-05-19 (×2): via INTRAVENOUS

## 2018-05-19 MED ORDER — LACTATED RINGERS IV SOLN: INTRAVENOUS | Status: DC

## 2018-05-19 MED ORDER — CALCIUM-MAGNESIUM-ZINC 333-133-5 MG PO TABS: 1.0000 | ORAL_TABLET | Freq: Every day | ORAL | Status: DC

## 2018-05-19 MED ORDER — VITAMIN B-12 1000 MCG PO TABS
2000.0000 ug | ORAL_TABLET | Freq: Every day | ORAL | Status: DC
  Administered 2018-05-20 – 2018-05-21 (×2): 2000 ug via ORAL
  Filled 2018-05-19 (×2): qty 2

## 2018-05-19 MED ORDER — VITAMIN C 500 MG PO TABS
1000.0000 mg | ORAL_TABLET | Freq: Three times a day (TID) | ORAL | Status: DC
  Administered 2018-05-19 – 2018-05-21 (×5): 1000 mg via ORAL
  Filled 2018-05-19 (×5): qty 2

## 2018-05-19 MED ORDER — PROPOFOL 500 MG/50ML IV EMUL
INTRAVENOUS | Status: DC | PRN
  Administered 2018-05-19: 50 ug/kg/min via INTRAVENOUS

## 2018-05-19 MED ORDER — ALUM & MAG HYDROXIDE-SIMETH 200-200-20 MG/5ML PO SUSP: 30.0000 mL | ORAL | Status: DC | PRN

## 2018-05-19 MED ORDER — VARENICLINE TARTRATE 1 MG PO TABS
1.0000 mg | ORAL_TABLET | Freq: Two times a day (BID) | ORAL | Status: DC
  Administered 2018-05-19 – 2018-05-21 (×4): 1 mg via ORAL
  Filled 2018-05-19 (×5): qty 1

## 2018-05-19 SURGICAL SUPPLY — 59 items
ARTICULEZE HEAD (Hips) ×3 IMPLANT
BLADE DRUM FLTD (BLADE) ×3 IMPLANT
BLADE SAW 90X25X1.19 OSCILLAT (BLADE) ×3 IMPLANT
CANISTER SUCT 1200ML W/VALVE (MISCELLANEOUS) ×3 IMPLANT
CANISTER SUCT 3000ML PPV (MISCELLANEOUS) ×6 IMPLANT
CARTRIDGE OIL MAESTRO DRILL (MISCELLANEOUS) ×1 IMPLANT
COVER WAND RF STERILE (DRAPES) ×3 IMPLANT
CUP PINNACLE 100 SERIES 58MM (Hips) ×3 IMPLANT
DIFFUSER DRILL AIR PNEUMATIC (MISCELLANEOUS) ×3 IMPLANT
DRAPE INCISE IOBAN 66X60 STRL (DRAPES) ×3 IMPLANT
DRAPE SHEET LG 3/4 BI-LAMINATE (DRAPES) ×3 IMPLANT
DRSG DERMACEA 8X12 NADH (GAUZE/BANDAGES/DRESSINGS) ×3 IMPLANT
DRSG OPSITE POSTOP 4X12 (GAUZE/BANDAGES/DRESSINGS) ×3 IMPLANT
DRSG OPSITE POSTOP 4X14 (GAUZE/BANDAGES/DRESSINGS) IMPLANT
DRSG TEGADERM 4X4.75 (GAUZE/BANDAGES/DRESSINGS) ×3 IMPLANT
DURAPREP 26ML APPLICATOR (WOUND CARE) ×3 IMPLANT
ELECT BLADE 6.5 EXT (BLADE) ×3 IMPLANT
ELECT CAUTERY BLADE 6.4 (BLADE) ×3 IMPLANT
GLOVE BIOGEL M STRL SZ7.5 (GLOVE) ×6 IMPLANT
GLOVE BIOGEL PI IND STRL 9 (GLOVE) ×1 IMPLANT
GLOVE BIOGEL PI INDICATOR 9 (GLOVE) ×2
GLOVE INDICATOR 8.0 STRL GRN (GLOVE) ×3 IMPLANT
GLOVE SURG SYN 9.0  PF PI (GLOVE) ×2
GLOVE SURG SYN 9.0 PF PI (GLOVE) ×1 IMPLANT
GOWN STRL REUS W/ TWL LRG LVL3 (GOWN DISPOSABLE) ×2 IMPLANT
GOWN STRL REUS W/TWL 2XL LVL3 (GOWN DISPOSABLE) ×3 IMPLANT
GOWN STRL REUS W/TWL LRG LVL3 (GOWN DISPOSABLE) ×4
HEAD ARTICULEZE (Hips) ×1 IMPLANT
HEMOVAC 400CC 10FR (MISCELLANEOUS) ×3 IMPLANT
HOLDER FOLEY CATH W/STRAP (MISCELLANEOUS) ×3 IMPLANT
HOOD PEEL AWAY FLYTE STAYCOOL (MISCELLANEOUS) ×6 IMPLANT
KIT TURNOVER KIT A (KITS) ×3 IMPLANT
LINER MARATHON NEUT +4X58X36 (Hips) ×3 IMPLANT
NDL SAFETY ECLIPSE 18X1.5 (NEEDLE) ×1 IMPLANT
NEEDLE HYPO 18GX1.5 SHARP (NEEDLE) ×2
NS IRRIG 500ML POUR BTL (IV SOLUTION) ×3 IMPLANT
OIL CARTRIDGE MAESTRO DRILL (MISCELLANEOUS) ×3
PACK HIP PROSTHESIS (MISCELLANEOUS) ×3 IMPLANT
PENCIL SMOKE ULTRAEVAC 22 CON (MISCELLANEOUS) ×3 IMPLANT
PIN STEIN THRED 5/32 (Pin) ×3 IMPLANT
PULSAVAC PLUS IRRIG FAN TIP (DISPOSABLE) ×3
SOL .9 NS 3000ML IRR  AL (IV SOLUTION) ×2
SOL .9 NS 3000ML IRR UROMATIC (IV SOLUTION) ×1 IMPLANT
SOL PREP PVP 2OZ (MISCELLANEOUS) ×3
SOLUTION PREP PVP 2OZ (MISCELLANEOUS) ×1 IMPLANT
SPONGE DRAIN TRACH 4X4 STRL 2S (GAUZE/BANDAGES/DRESSINGS) ×3 IMPLANT
STAPLER SKIN PROX 35W (STAPLE) ×3 IMPLANT
STEM FEM CMNTLSS LG AML 15.0 (Hips) ×3 IMPLANT
SUT ETHIBOND #5 BRAIDED 30INL (SUTURE) ×3 IMPLANT
SUT VIC AB 0 CT1 36 (SUTURE) ×3 IMPLANT
SUT VIC AB 1 CT1 36 (SUTURE) ×6 IMPLANT
SUT VIC AB 2-0 CT1 27 (SUTURE) ×2
SUT VIC AB 2-0 CT1 TAPERPNT 27 (SUTURE) ×1 IMPLANT
SYR 20CC LL (SYRINGE) ×3 IMPLANT
TAPE CLOTH 3X10 WHT NS LF (GAUZE/BANDAGES/DRESSINGS) ×3 IMPLANT
TAPE TRANSPORE STRL 2 31045 (GAUZE/BANDAGES/DRESSINGS) ×3 IMPLANT
TIP FAN IRRIG PULSAVAC PLUS (DISPOSABLE) ×1 IMPLANT
TOWEL OR 17X26 4PK STRL BLUE (TOWEL DISPOSABLE) ×3 IMPLANT
TRAY FOLEY MTR SLVR 16FR STAT (SET/KITS/TRAYS/PACK) ×3 IMPLANT

## 2018-05-19 NOTE — Anesthesia Procedure Notes (Addendum)
Performed by: Emelia Sandoval, CRNA Pre-anesthesia Checklist: Patient identified, Emergency Drugs available, Suction available, Patient being monitored and Timeout performed Oxygen Delivery Method: Simple face mask       

## 2018-05-19 NOTE — Transfer of Care (Signed)
Immediate Anesthesia Transfer of Care Note  Patient: Johnny Williams  Procedure(s) Performed: TOTAL HIP ARTHROPLASTY (Right Hip)  Patient Location: PACU  Anesthesia Type:Spinal  Level of Consciousness: sedated and patient cooperative  Airway & Oxygen Therapy: Patient Spontanous Breathing and Patient connected to face mask oxygen  Post-op Assessment: Report given to RN and Post -op Vital signs reviewed and stable  Post vital signs: Reviewed and stable  Last Vitals:  Vitals Value Taken Time  BP 94/62 05/19/2018  6:55 PM  Temp    Pulse 78 05/19/2018  6:56 PM  Resp 19 05/19/2018  6:56 PM  SpO2 96 % 05/19/2018  6:56 PM  Vitals shown include unvalidated device data.  Last Pain:  Vitals:   05/19/18 1344  TempSrc: Temporal  PainSc: 0-No pain         Complications: No apparent anesthesia complications

## 2018-05-19 NOTE — Anesthesia Preprocedure Evaluation (Signed)
Anesthesia Evaluation  Patient identified by MRN, date of birth, ID band Patient awake    Reviewed: Allergy & Precautions, H&P , NPO status , Patient's Chart, lab work & pertinent test results, reviewed documented beta blocker date and time   History of Anesthesia Complications Negative for: history of anesthetic complications  Airway Mallampati: III  TM Distance: >3 FB Neck ROM: full    Dental  (+) Caps, Dental Advidsory Given, Poor Dentition, Partial Upper, Missing   Pulmonary shortness of breath and with exertion, sleep apnea , COPD,  COPD inhaler, neg recent URI, Current Smoker,           Cardiovascular Exercise Tolerance: Good hypertension, (-) angina(-) CAD, (-) Past MI, (-) Cardiac Stents and (-) CABG (-) dysrhythmias (-) Valvular Problems/Murmurs     Neuro/Psych neg Seizures PSYCHIATRIC DISORDERS Anxiety  Neuromuscular disease    GI/Hepatic Neg liver ROS, GERD  ,  Endo/Other  diabetesMorbid obesity  Renal/GU negative Renal ROS  negative genitourinary   Musculoskeletal   Abdominal   Peds  Hematology negative hematology ROS (+)   Anesthesia Other Findings Past Medical History: No date: Anxiety No date: Arthritis No date: Chronic insomnia No date: COPD (chronic obstructive pulmonary disease) (HCC) No date: Dyspnea No date: Esophageal stricture No date: Esophageal varices (HCC) No date: GERD (gastroesophageal reflux disease) No date: Hyperlipidemia No date: Hypertension No date: Joint ache No date: Pre-diabetes No date: Reflux esophagitis No date: Sleep apnea   Reproductive/Obstetrics negative OB ROS                             Anesthesia Physical Anesthesia Plan  ASA: III  Anesthesia Plan: Spinal   Post-op Pain Management:    Induction:   PONV Risk Score and Plan: 0 and Propofol infusion and TIVA  Airway Management Planned: Natural Airway and Simple Face  Mask  Additional Equipment:   Intra-op Plan:   Post-operative Plan:   Informed Consent: I have reviewed the patients History and Physical, chart, labs and discussed the procedure including the risks, benefits and alternatives for the proposed anesthesia with the patient or authorized representative who has indicated his/her understanding and acceptance.   Dental Advisory Given  Plan Discussed with: Anesthesiologist, CRNA and Surgeon  Anesthesia Plan Comments:         Anesthesia Quick Evaluation

## 2018-05-19 NOTE — H&P (Signed)
The patient has been re-examined, and the chart reviewed, and there have been no interval changes to the documented history and physical.    The risks, benefits, and alternatives have been discussed at length. The patient expressed understanding of the risks benefits and agreed with plans for surgical intervention.  James P. Hooten, Jr. M.D.    

## 2018-05-19 NOTE — Anesthesia Post-op Follow-up Note (Signed)
Anesthesia QCDR form completed.        

## 2018-05-19 NOTE — Op Note (Signed)
OPERATIVE NOTE  DATE OF SURGERY:  05/19/2018  PATIENT NAME:  Johnny Williams   DOB: 1961-06-01  MRN: [id number]  PRE-OPERATIVE DIAGNOSIS: Degenerative arthrosis of the right hip, primary  POST-OPERATIVE DIAGNOSIS:  Same  PROCEDURE:  Right total hip arthroplasty  SURGEON:  Marciano Sequin. M.D.  ASSISTANT:  Vance Peper, PA (present and scrubbed throughout the case, critical for assistance with exposure, retraction, instrumentation, and closure)  ANESTHESIA: spinal  ESTIMATED BLOOD LOSS: 300 mL  FLUIDS REPLACED: 1100 mL of crystalloid  DRAINS: 2 medium drains to a Hemovac reservoir  IMPLANTS UTILIZED: DePuy 15 mm large stature AML femoral stem, 58 mm OD Pinnacle 100 acetabular component, +4 mm neutral Pinnacle Marathon polyethylene insert, and a 36 mm M-SPEC +5 mm hip ball  INDICATIONS FOR SURGERY: Johnny Williams is a 57 y.o. year old male with a long history of progressive hip and groin  pain. X-rays demonstrated severe degenerative changes. The patient had not seen any significant improvement despite conservative nonsurgical intervention. After discussion of the risks and benefits of surgical intervention, the patient expressed understanding of the risks benefits and agree with plans for total hip arthroplasty.   The risks, benefits, and alternatives were discussed at length including but not limited to the risks of infection, bleeding, nerve injury, stiffness, blood clots, the need for revision surgery, limb length inequality, dislocation, cardiopulmonary complications, among others, and they were willing to proceed.  PROCEDURE IN DETAIL: The patient was brought into the operating room and, after adequate spinal anesthesia was achieved, the patient was placed in a left lateral decubitus position. Axillary roll was placed and all bony prominences were well-padded. The patient's right hip was cleaned and prepped with alcohol and DuraPrep and draped in the usual sterile fashion. A "timeout" was  performed as per usual protocol. A lateral curvilinear incision was made gently curving towards the posterior superior iliac spine. The IT band was incised in line with the skin incision and the fibers of the gluteus maximus were split in line. The piriformis tendon was identified, skeletonized, and incised at its insertion to the proximal femur and reflected posteriorly. A T type posterior capsulotomy was performed. Prior to dislocation of the femoral head, a threaded Steinmann pin was inserted through a separate stab incision into the pelvis superior to the acetabulum and bent in the form of a stylus so as to assess limb length and hip offset throughout the procedure. The femoral head was then dislocated posteriorly. Inspection of the femoral head demonstrated severe degenerative changes with full-thickness loss of articular cartilage. The femoral neck cut was performed using an oscillating saw. The anterior capsule was elevated off of the femoral neck using a periosteal elevator. Attention was then directed to the acetabulum. The remnant of the labrum was excised using electrocautery. Inspection of the acetabulum also demonstrated significant degenerative changes. The acetabulum was reamed in sequential fashion up to a 57 mm diameter. Good punctate bleeding bone was encountered. A 58 mm Pinnacle 100 acetabular component was positioned and impacted into place. Good scratch fit was appreciated. A +4 mm neutral polyethylene trial was inserted.  Attention was then directed to the proximal femur. A hole for reaming of the proximal femoral canal was created using a high-speed burr. The femoral canal was reamed in sequential fashion up to a 14.5 mm diameter. This allowed for approximately 6 cm of scratch fit.  It was thus elected to ream up to a 15 mm diameter to allow for a line to line fit.  Serial broaches were inserted up to a 15 mm large stature femoral broach. Calcar region was planed and a trial reduction was  performed using a 36 mm hip ball with a +5 mm neck length. Good equalization of limb lengths and hip offset was appreciated and excellent stability was noted both anteriorly and posteriorly. Trial components were removed. The acetabular shell was irrigated with copious amounts of normal saline with antibiotic solution and suctioned dry. A +4 mm neutral Pinnacle Marathon polyethylene insert was positioned and impacted into place. Next, a 15 mm large stature AML femoral stem was positioned and impacted into place. Excellent scratch fit was appreciated. A trial reduction was again performed with a 36 mm hip ball with a 5 mm neck length. Again, good equalization of limb lengths was appreciated and excellent stability appreciated both anteriorly and posteriorly. The hip was then dislocated and the trial hip ball was removed. The Morse taper was cleaned and dried. A 36 mm M-SPEC hip ball with a 5 mm neck length was placed on the trunnion and impacted into place. The hip was then reduced and placed through range of motion. Excellent stability was appreciated both anteriorly and posteriorly.  The wound was irrigated with copious amounts of normal saline with antibiotic solution and suctioned dry. Good hemostasis was appreciated. The posterior capsulotomy was repaired using #5 Ethibond. Piriformis tendon was reapproximated to the undersurface of the gluteus medius tendon using #5 Ethibond. Two medium drains were placed in the wound bed and brought out through separate stab incisions to be attached to a Hemovac reservoir. The IT band was reapproximated using interrupted sutures of #1 Vicryl. Subcutaneous tissue was approximated using first #0 Vicryl followed by #2-0 Vicryl. The skin was closed with skin staples.  The patient tolerated the procedure well and was transported to the recovery room in stable condition.   Marciano Sequin., M.D.

## 2018-05-19 NOTE — Anesthesia Procedure Notes (Signed)
Spinal  Patient location during procedure: OR Start time: 05/19/2018 3:17 PM End time: 05/19/2018 3:20 PM Staffing Anesthesiologist: Virgina Deakins, Precious Haws, MD Performed: anesthesiologist  Preanesthetic Checklist Completed: patient identified, site marked, surgical consent, pre-op evaluation, timeout performed, IV checked, risks and benefits discussed and monitors and equipment checked Spinal Block Patient position: sitting Prep: ChloraPrep Patient monitoring: heart rate, continuous pulse ox, blood pressure and cardiac monitor Approach: midline Location: L3-4 Injection technique: single-shot Needle Needle type: Whitacre and Introducer  Needle gauge: 24 G Needle length: 9 cm Assessment Sensory level: T10 Additional Notes Negative paresthesia. Negative blood return. Positive free-flowing CSF. Expiration date of kit checked and confirmed. Patient tolerated procedure well, without complications.

## 2018-05-20 ENCOUNTER — Encounter: Payer: Self-pay | Admitting: Orthopedic Surgery

## 2018-05-20 ENCOUNTER — Other Ambulatory Visit: Payer: Self-pay

## 2018-05-20 MED ORDER — ENOXAPARIN SODIUM 40 MG/0.4ML ~~LOC~~ SOLN: 40.0000 mg | SUBCUTANEOUS | 0 refills | Status: DC

## 2018-05-20 MED ORDER — CELECOXIB 200 MG PO CAPS: 200.0000 mg | ORAL_CAPSULE | Freq: Two times a day (BID) | ORAL | 0 refills | Status: AC

## 2018-05-20 MED ORDER — OXYCODONE HCL 5 MG PO TABS: 5.0000 mg | ORAL_TABLET | ORAL | 0 refills | Status: DC | PRN

## 2018-05-20 MED ORDER — TRAMADOL HCL 50 MG PO TABS: 50.0000 mg | ORAL_TABLET | ORAL | 1 refills | Status: DC | PRN

## 2018-05-20 NOTE — Progress Notes (Signed)
Patient dangled to the side of the bed. Tolerated well. Mother at bedside.

## 2018-05-20 NOTE — Progress Notes (Signed)
  Subjective: 1 Day Post-Op Procedure(s) (LRB): TOTAL HIP ARTHROPLASTY (Right) Patient reports pain as mild.   Patient seen in rounds with Dr. Roland Rack. Patient is well, and has had no acute complaints or problems Plan is to go Home after hospital stay. Negative for chest pain and shortness of breath Fever: no Gastrointestinal: Negative for nausea and vomiting.  Mild abdominal distention.  Objective: Vital signs in last 24 hours: Temp:  [97.1 F (36.2 C)-98.4 F (36.9 C)] 97.6 F (36.4 C) (11/28 0445) Pulse Rate:  [63-89] 68 (11/28 0445) Resp:  [15-20] 17 (11/27 2339) BP: (94-157)/(62-92) 140/88 (11/28 0445) SpO2:  [93 %-98 %] 95 % (11/28 0445)  Intake/Output from previous day:  Intake/Output Summary (Last 24 hours) at 05/20/2018 0657 Last data filed at 05/20/2018 0600 Gross per 24 hour  Intake 2353.52 ml  Output 2770 ml  Net -416.48 ml    Intake/Output this shift: Total I/O In: 1453.5 [I.V.:1140.4; IV Piggyback:313.1] Out: 2320 [Urine:2000; Drains:320]  Labs: No results for input(s): HGB in the last 72 hours. No results for input(s): WBC, RBC, HCT, PLT in the last 72 hours. No results for input(s): NA, K, CL, CO2, BUN, CREATININE, GLUCOSE, CALCIUM in the last 72 hours. No results for input(s): LABPT, INR in the last 72 hours.   EXAM General - Patient is Alert and Oriented Extremity - Sensation intact distally Dorsiflexion/Plantar flexion intact No cellulitis present Compartment soft Dressing/Incision - clean, dry, with Hemovac intact Motor Function - intact, moving foot and toes well on exam.   Past Medical History:  Diagnosis Date  . Anxiety   . Arthritis   . Chronic insomnia   . COPD (chronic obstructive pulmonary disease) ([place])   . Dyspnea   . Esophageal stricture   . Esophageal varices ([place])   . GERD (gastroesophageal reflux disease)   . Hyperlipidemia   . Hypertension   . Joint ache   . Pre-diabetes   . Reflux esophagitis   . Sleep apnea      Assessment/Plan: 1 Day Post-Op Procedure(s) (LRB): TOTAL HIP ARTHROPLASTY (Right) Active Problems:   H/O total hip arthroplasty  Estimated body mass index is 38.77 kg/m as calculated from the following:   Height as of 05/05/18: 6\' 2"  (1.88 m).   Weight as of 05/05/18: 137 kg. Advance diet Up with therapy D/C IV fluids Plan for discharge tomorrow with home health physical therapy.  DVT Prophylaxis - Lovenox, Foot Pumps and TED hose Weight-Bearing as tolerated to right leg  Reche Dixon, PA-C Orthopaedic Surgery 05/20/2018, 6:57 AM

## 2018-05-20 NOTE — Clinical Social Work Note (Signed)
CSW received referral for SNF.  Case discussed with case manager and plan is to discharge home with home health.  CSW to sign off please re-consult if social work needs arise.  Sharran Caratachea R. Pamala Hayman, MSW, LCSWA [phone]  

## 2018-05-20 NOTE — Anesthesia Postprocedure Evaluation (Signed)
Anesthesia Post Note  Patient: Johnny Williams  Procedure(s) Performed: TOTAL HIP ARTHROPLASTY (Right Hip)  Patient location during evaluation: Other Anesthesia Type: Spinal Level of consciousness: awake and alert and oriented Pain management: pain level controlled Vital Signs Assessment: post-procedure vital signs reviewed and stable Respiratory status: spontaneous breathing Cardiovascular status: blood pressure returned to baseline Postop Assessment: no headache, no backache and spinal receding     Last Vitals:  Vitals:   05/20/18 0933 05/20/18 1200  BP:  138/86  Pulse: 100 87  Resp:  18  Temp:  36.5 C  SpO2: 95% 94%    Last Pain:  Vitals:   05/20/18 1200  TempSrc: Oral  PainSc:                  [name]

## 2018-05-20 NOTE — Evaluation (Signed)
Physical Therapy Evaluation Patient Details Name: Johnny Williams MRN: [id number] DOB: 1961/04/22 Today's Date: 05/20/2018   History of Present Illness  Pt is a pleasant 57 y/o male that presents s/p R total hip replacement on 05/20/2018.   Clinical Impression  Patient is a pleasant 57 y/o male s/p R total hip replacement. Per patient he has had bilateral hip and lower back pain, and notes significant relief already after THR. He has been ambulatory in town with an AD, and was able to perform transfers and ambulation this session with minimal to no assistance from therapist. He was able to ambulate 200' with RW with appropriate gait mechanics. He would likely benefit from HHPT to address residual mobility deficits to allow him to resume community mobility.     Follow Up Recommendations Home health PT    Equipment Recommendations  Rolling walker with 5" wheels    Recommendations for Other Services       Precautions / Restrictions Precautions Precautions: Posterior Hip Restrictions Weight Bearing Restrictions: Yes RLE Weight Bearing: Weight bearing as tolerated      Mobility  Bed Mobility Overal bed mobility: Modified Independent             General bed mobility comments: Patient is able to complete transfer with HHA on bed railing and HOB elevated with cuing for technique.   Transfers Overall transfer level: Modified independent Equipment used: Rolling walker (2 wheeled)             General transfer comment: Patient cued for appropriate transfer mechanics with use of RW  Ambulation/Gait Ambulation/Gait assistance: Modified independent (Device/Increase time) Gait Distance (Feet): 200 Feet Assistive device: Rolling walker (2 wheeled) Gait Pattern/deviations: WFL(Within Functional Limits)   Gait velocity interpretation: <1.31 ft/sec, indicative of household ambulator General Gait Details: Patient demonstrates decreased stride length bil, no buckling observed.    Stairs            Wheelchair Mobility    Modified Rankin (Stroke Patients Only)       Balance Overall balance assessment: Modified Independent                                           Pertinent Vitals/Pain Pain Assessment: Faces Faces Pain Scale: Hurts a little bit Pain Location: R buttock Pain Descriptors / Indicators: Operative site guarding;Discomfort Pain Intervention(s): Limited activity within patient's tolerance;Repositioned;Monitored during session    Home Living Family/patient expects to be discharged to:: Private residence Living Arrangements: Alone;Parent Available Help at Discharge: Family Type of Home: Mobile home Home Access: Stairs to enter Entrance Stairs-Rails: Can reach both Entrance Stairs-Number of Steps: 2 Home Layout: One level Home Equipment: Walker - standard      Prior Function Level of Independence: Independent         Comments: Patient has been able to ambulate in [place] and perform ADLs independently.      Hand Dominance        Extremity/Trunk Assessment   Upper Extremity Assessment Upper Extremity Assessment: Overall WFL for tasks assessed    Lower Extremity Assessment Lower Extremity Assessment: Overall WFL for tasks assessed       Communication   Communication: No difficulties  Cognition Arousal/Alertness: Awake/alert Behavior During Therapy: WFL for tasks assessed/performed Overall Cognitive Status: Within Functional Limits for tasks assessed  General Comments      Exercises Total Joint Exercises Ankle Circles/Pumps: AROM;15 reps;Both Heel Slides: AROM;15 reps;Both Hip ABduction/ADduction: AROM;Both;15 reps Long Arc Quad: AROM;15 reps;Both   Assessment/Plan    PT Assessment Patient needs continued PT services  PT Problem List Decreased strength;Decreased mobility;Decreased activity tolerance;Decreased balance;Decreased  knowledge of use of DME;Pain;Decreased knowledge of precautions       PT Treatment Interventions DME instruction;Therapeutic activities;Therapeutic exercise;Gait training;Stair training;Balance training;Neuromuscular re-education    PT Goals (Current goals can be found in the Care Plan section)  Acute Rehab PT Goals Patient Stated Goal: To return home safely  PT Goal Formulation: With patient Time For Goal Achievement: 06/03/18 Potential to Achieve Goals: Good    Frequency BID   Barriers to discharge        Co-evaluation               AM-PAC PT "6 Clicks" Mobility  Outcome Measure Help needed turning from your back to your side while in a flat bed without using bedrails?: A Little Help needed moving from lying on your back to sitting on the side of a flat bed without using bedrails?: A Little Help needed moving to and from a bed to a chair (including a wheelchair)?: None Help needed standing up from a chair using your arms (e.g., wheelchair or bedside chair)?: None Help needed to walk in hospital room?: None Help needed climbing 3-5 steps with a railing? : A Little 6 Click Score: 21    End of Session Equipment Utilized During Treatment: Gait belt Activity Tolerance: Patient tolerated treatment well Patient left: in chair;with call bell/phone within reach;with chair alarm set Nurse Communication: Mobility status;Weight bearing status PT Visit Diagnosis: Muscle weakness (generalized) (M62.81);Difficulty in walking, not elsewhere classified (R26.2)    Time: 4628-6381 PT Time Calculation (min) (ACUTE ONLY): 21 min   Charges:   PT Evaluation $PT Eval Moderate Complexity: 1 Mod PT Treatments $Therapeutic Exercise: 8-22 mins        Royce Macadamia PT, DPT, CSCS   05/20/2018, 12:38 PM

## 2018-05-21 NOTE — Progress Notes (Signed)
Discharge summary reviewed with verbal understanding. 3 Rxs given to patient upon discharge. Dressing changed prior to discharge.

## 2018-05-21 NOTE — Discharge Summary (Signed)
Physician Discharge Summary  Patient ID: Johnny Williams MRN: [id number] DOB/AGE: 1960/12/18 57 y.o.  Admit date: 05/19/2018 Discharge date: 05/21/2018  Admission Diagnoses:  PRIMARY OSTEOARTHRITIS OF RIGHT HIP   Discharge Diagnoses: Patient Active Problem List   Diagnosis Date Noted  . H/O total hip arthroplasty 05/19/2018  . Left lumbar radiculitis 11/09/2017  . DDD (degenerative disc disease), lumbosacral 11/09/2017  . Vitamin D insufficiency 10/12/2017  . 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 Lumbar facet syndrome (Bilateral) (L>R) 10/12/2017  . Anxiety 09/29/2017  . Chronic insomnia 09/29/2017  . Cluster headaches 09/29/2017  . COPD (chronic obstructive pulmonary disease) ([place]) 09/29/2017  . Esophageal varices ([place]) 09/29/2017  . Essential hypertension 09/29/2017  . Sleep apnea 09/29/2017  . Smoker 09/29/2017  . Stricture of esophagus 09/29/2017  . Type 2 diabetes mellitus without complication ([place]) 34/19/6222  . 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 Chronic lower extremity pain [facility] Area of Pain) (Bilateral) (L>R) 09/29/2017  . Chronic hip pain (Primary Area of Pain) (Bilateral) (L>R) 09/29/2017  . Chronic knee pain (Fourth Area of Pain) (Bilateral) (L>R) 09/29/2017  . Chronic pain syndrome 09/29/2017  . Pharmacologic therapy 09/29/2017  . Disorder of skeletal system 09/29/2017  . Problems influencing health status 09/29/2017  . High risk medication use 05/11/2014  . Gastritis and duodenitis 11/07/2013  . Hyperlipidemia, unspecified 11/07/2013  . Variants of migraine, not elsewhere classified, with intractable migraine, so stated, without mention of status migrainosus 11/07/2013    Past Medical History:  Diagnosis Date  . Anxiety   . Arthritis   . Chronic insomnia   . COPD (chronic obstructive pulmonary disease) ([place])   . Dyspnea   . Esophageal stricture   . Esophageal varices ([place])   .  GERD (gastroesophageal reflux disease)   . Hyperlipidemia   . Hypertension   . Joint ache   . Pre-diabetes   . Reflux esophagitis   . Sleep apnea      Transfusion: No transfusions on this admission   Consultants (if any): Treatment Team:  Wilhelmina Mcardle, MD  Discharged Condition: Improved  Hospital Course: Johnny Williams is an 57 y.o. male who was admitted 05/19/2018 with a diagnosis of degenerative arthrosis right hip and went to the operating room on 05/19/2018 and underwent the above named procedures.    Surgeries:Procedure(s): TOTAL HIP ARTHROPLASTY on 05/19/2018  PRE-OPERATIVE DIAGNOSIS: Degenerative arthrosis of the right hip, primary  POST-OPERATIVE DIAGNOSIS:  Same  PROCEDURE:  Right total hip arthroplasty  SURGEON:  Marciano Sequin. M.D.  ASSISTANT:  Vance Peper, PA (present and scrubbed throughout the case, critical for assistance with exposure, retraction, instrumentation, and closure)  ANESTHESIA: spinal  ESTIMATED BLOOD LOSS: 300 mL  FLUIDS REPLACED: 1100 mL of crystalloid  DRAINS: 2 medium drains to a Hemovac reservoir  IMPLANTS UTILIZED: DePuy 15 mm large stature AML femoral stem, 58 mm OD Pinnacle 100 acetabular component, +4 mm neutral Pinnacle Marathon polyethylene insert, and a 36 mm M-SPEC +5 mm hip ball  INDICATIONS FOR SURGERY: Johnny Williams is a 57 y.o. year old male with a long history of progressive hip and groin  pain. X-rays demonstrated severe degenerative changes. The patient had not seen any significant improvement despite conservative nonsurgical intervention. After discussion of the risks and benefits of surgical intervention, the patient expressed understanding of the risks benefits and agree with plans for total hip arthroplasty.   The risks, benefits, and alternatives were discussed at length including  but not limited to the risks of infection, bleeding, nerve injury, stiffness, blood clots, the need for revision surgery, limb length  inequality, dislocation, cardiopulmonary complications, among others, and they were willing to proceed. Patient tolerated the surgery well. No complications .Patient was taken to PACU where she was stabilized and then transferred to the orthopedic floor.  Patient started on Lovenox 30 mg q 12 hrs. Foot pumps applied bilaterally at 80 mm hgb. Heels elevated off bed with rolled towels. No evidence of DVT. Calves non tender. Negative [place]. Physical therapy started on day #1 for gait training and transfer with OT starting on  day #1 for ADL and assisted devices. Patient has done well with therapy. Ambulated greater than 200 feet upon being discharged.  Was able to ascend and descend 4 steps safely and independently  Patient's IV And Foley were discontinued on day #1 with Hemovac being discontinued on day #2. Dressing was changed on day 2 prior to patient being discharged   He was given perioperative antibiotics:  Anti-infectives (From admission, onward)   Start     Dose/Rate Route Frequency Ordered Stop   05/19/18 2130  ceFAZolin (ANCEF) 3 g in dextrose 5 % 50 mL IVPB     3 g 100 mL/hr over 30 Minutes Intravenous Every 6 hours 05/19/18 2007 05/20/18 2129   05/19/18 0600  ceFAZolin (ANCEF) IVPB 2g/100 mL premix     2 g 200 mL/hr over 30 Minutes Intravenous On call to O.R. 05/18/18 2147 05/19/18 1602    .  He was fitted with AV 1 compression foot pump devices, instructed on heel pumps, early ambulation, and fitted with TED stockings bilaterally for DVT prophylaxis.  He benefited maximally from the hospital stay and there were no complications.    Recent vital signs:  Vitals:   05/21/18 0020 05/21/18 0508  BP: 132/74 138/86  Pulse: 72 76  Resp: 18 20  Temp: 97.7 F (36.5 C) 97.8 F (36.6 C)  SpO2: 92% 91%    Recent laboratory studies:  Lab Results  Component Value Date   HGB 16.7 05/05/2018   Lab Results  Component Value Date   WBC 10.9 (H) 05/05/2018   PLT 156 05/05/2018    Lab Results  Component Value Date   INR 1.05 05/05/2018   Lab Results  Component Value Date   NA 142 05/05/2018   K 4.0 05/05/2018   CL 104 05/05/2018   CO2 27 05/05/2018   BUN 15 05/05/2018   CREATININE 0.73 05/05/2018   GLUCOSE 105 (H) 05/05/2018    Discharge Medications:   Allergies as of 05/21/2018      Reactions   Fluoxetine    Insomnia      Medication List    STOP taking these medications   meloxicam 7.5 MG tablet Commonly known as:  MOBIC     TAKE these medications   ABSORBINE ARTHRITIS STRENGTH EX Apply topically.   ABSORBINE PAIN RELIEVING EX Apply 1 application topically as needed.   acetaminophen 650 MG CR tablet Commonly known as:  TYLENOL Take 1,300 mg by mouth 3 (three) times daily.   ARTHRITIS STRENGTH BC POWDER PO Take 1 packet by mouth every 4 (four) hours. Notes to patient:  May begin taking once Lovenox injections are completed   atorvastatin 40 MG tablet Commonly known as:  LIPITOR Take 40 mg by mouth daily.   BEANO Tabs Take 800 mg by mouth daily as needed.   calcium carbonate 500 MG chewable tablet Commonly known  as:  TUMS - dosed in mg elemental calcium Chew 2 tablets by mouth daily.   CALCIUM-MAGNESIUM-ZINC PO Take 1 tablet by mouth daily.   celecoxib 200 MG capsule Commonly known as:  CELEBREX Take 1 capsule (200 mg total) by mouth 2 (two) times daily.   diclofenac sodium 1 % Gel Commonly known as:  VOLTAREN Apply 2 g topically 4 (four) times daily.   diphenhydrAMINE 25 MG tablet Commonly known as:  BENADRYL Take 25 mg by mouth daily as needed.   docusate sodium 100 MG capsule Commonly known as:  COLACE Take 100 mg by mouth 2 (two) times daily.   enoxaparin 40 MG/0.4ML injection Commonly known as:  LOVENOX Inject 0.4 mLs (40 mg total) into the skin daily.   hydrochlorothiazide 25 MG tablet Commonly known as:  HYDRODIURIL Take 25 mg by mouth daily.   ICY HOT EX Apply 1 application topically as needed.    omeprazole 20 MG capsule Commonly known as:  PRILOSEC Take 20 mg by mouth daily.   ONE-A-DAY 50 PLUS PO Take by mouth.   oxyCODONE 5 MG immediate release tablet Commonly known as:  Oxy IR/ROXICODONE Take 1 tablet (5 mg total) by mouth every 4 (four) hours as needed for moderate pain (pain score 4-6).   Simethicone 180 MG Caps Take 2 capsules by mouth as needed.   Simethicone 125 MG Tabs Take 2 tablets by mouth as needed.   traMADol 50 MG tablet Commonly known as:  ULTRAM Take 1-2 tablets (50-100 mg total) by mouth every 4 (four) hours as needed for moderate pain.   varenicline 1 MG tablet Commonly known as:  CHANTIX Take 1 mg by mouth 2 (two) times daily.   verapamil 240 MG 24 hr capsule Commonly known as:  VERELAN PM Take 240 mg by mouth daily.   vitamin B-12 1000 MCG tablet Commonly known as:  CYANOCOBALAMIN Take 2,000 mcg by mouth daily.   vitamin C 1000 MG tablet Take 1,000 mg by mouth 3 (three) times daily.   Vitamin D3 125 MCG (5000 UT) Caps Take 1 capsule by mouth daily.            Durable Medical Equipment  (From admission, onward)         Start     Ordered   05/19/18 2008  DME Walker rolling  Once    Question:  Patient needs a walker to treat with the following condition  Answer:  S/P total hip arthroplasty   05/19/18 2007   05/19/18 2008  DME Bedside commode  Once    Question:  Patient needs a bedside commode to treat with the following condition  Answer:  S/P total hip arthroplasty   05/19/18 2007          Diagnostic Studies: Dg Hip Port Unilat With Pelvis 1v Right  Result Date: 05/19/2018 CLINICAL DATA:  Post total hip arthroplasty EXAM: DG HIP (WITH OR WITHOUT PELVIS) 1V PORT RIGHT COMPARISON:  Preoperative exam of 10/12/2017 FINDINGS: RIGHT hip prosthesis identified. No fracture or dislocation. No acute osseous abnormality seen. Surgical drains present. Degenerative changes LEFT hip joint. IMPRESSION: RIGHT hip prosthesis without acute  complication. Degenerative changes LEFT hip joint. Electronically Signed   By: Lavonia Dana M.D.   On: 05/19/2018 19:51    Disposition: Discharge disposition: 01-Home or Self Care       Discharge Instructions    Diet - low sodium heart healthy   Complete by:  As directed    Increase activity  slowly   Complete by:  As directed       Follow-up Information    Hooten, Laurice Record, MD On 07/06/2018.   Specialty:  Orthopedic Surgery Why:  at 3:30pm Contact information: [place] [address] [phone]            Signed: Watt Climes 05/21/2018, 7:03 AM

## 2018-05-21 NOTE — Care Management Note (Addendum)
Case Management Note  Patient Details  Name: Johnny Williams MRN: [id number] Date of Birth: 06-26-1960  Subjective/Objective:                  RNCM spoke with patient regarding transition to home today.  He states he lives with his mother. He has a standard walker available for use at home but requests a front-wheeled walker as recommended by PT.  He has no preference of home health agencies however surgeon preference is [facility].  He uses [facility] [place] for medications [phone].   Action/Plan: Preference provided to patient of home health agencies. Referral to [facility] for Home health PT.  Referral to [facility] for rolling walker. [place] notified that patient will need Lovenox (brand name only)  40mg  injection daily for 14 days no-refills.  Cost $4 under Medicaid.  Pharmacy advised to call RNCM with any questions.    Expected Discharge Date:  05/21/18               Expected Discharge Plan:  [name]  In-House Referral:     Discharge planning Services  CM Consult  Post Acute Care Choice:  Home Health, Durable Medical Equipment Choice offered to:  Patient  DME Arranged:  Walker rolling DME Agency:  [place]:  PT [place]:  [facility] ([facility])  Status of Service:  Completed, signed off  If discussed at [employer] of Stay Meetings, dates discussed:    Additional Comments:  Marshell Garfinkel, RN 05/21/2018, 8:07 AM

## 2018-05-21 NOTE — Progress Notes (Signed)
Physical Therapy Treatment Patient Details Name: Johnny Williams MRN: [id number] DOB: [birth year] Today's Date: 05/21/2018    History of Present Illness Pt is a pleasant 57 y/o male that presents s/p R total hip replacement on 05/20/2018.     PT Comments    Pt did well with PT and though he did need consistent reminders to insure he was aware of precautions during multiple sit to stand efforts and bed mobility he ultimately did very well.  He showed good confidence and strength with all tasks, had no issues with stair negotiation or ambulation and generally is progressing very nicely with post-op day 2 expectations.    Follow Up Recommendations  Home health PT     Equipment Recommendations  Rolling walker with 5" wheels    Recommendations for Other Services       Precautions / Restrictions Precautions Precautions: Posterior Hip Restrictions RLE Weight Bearing: Weight bearing as tolerated    Mobility  Bed Mobility Overal bed mobility: Modified Independent             General bed mobility comments: Again able to get to sitting w/o direct assist.  Maintained precautions with minimal cuing, slow but independent transition to sitting.  Transfers Overall transfer level: Modified independent Equipment used: Rolling walker (2 wheeled)             General transfer comment: Pt able to perform at least 4 sit to stand transfers w/o assist (at times from low surfaces) with only minimal cuing to insure precautions were followed.  Pt with good strength, confidence and execution with transfers.  Ambulation/Gait Ambulation/Gait assistance: Independent Gait Distance (Feet): 250 Feet Assistive device: Rolling walker (2 wheeled)       General Gait Details: Pt able to confidently maintain consistent cadence and mobility.  Showed only minimal fatigue and generally did very well.    Stairs Stairs: Yes Stairs assistance: Independent Stair Management: Two rails;One rail Right Number  of Stairs: 4 General stair comments: Pt able to negotiate 4 steps X 2, once with single R rail, once with b/l rails.  No issues in either senario.    Wheelchair Mobility    Modified Rankin (Stroke Patients Only)       Balance Overall balance assessment: Modified Independent                                          Cognition Arousal/Alertness: Awake/alert Behavior During Therapy: WFL for tasks assessed/performed Overall Cognitive Status: Within Functional Limits for tasks assessed                                        Exercises Total Joint Exercises Ankle Circles/Pumps: Strengthening;15 reps Quad Sets: Strengthening;10 reps Gluteal Sets: Strengthening;10 reps Heel Slides: Strengthening;10 reps Hip ABduction/ADduction: Strengthening;10 reps Long Arc Quad: Strengthening;10 reps Knee Flexion: Strengthening;10 reps Marching in Standing: Seated;Strengthening;10 reps    General Comments        Pertinent Vitals/Pain Pain Assessment: 0-10 Pain Score: 5     Home Living                      Prior Function            PT Goals (current goals can now be found in the care plan section) Progress towards  PT goals: Progressing toward goals    Frequency    BID      PT Plan Current plan remains appropriate    Co-evaluation              AM-PAC PT "6 Clicks" Mobility   Outcome Measure  Help needed turning from your back to your side while in a flat bed without using bedrails?: None Help needed moving from lying on your back to sitting on the side of a flat bed without using bedrails?: None Help needed moving to and from a bed to a chair (including a wheelchair)?: None Help needed standing up from a chair using your arms (e.g., wheelchair or bedside chair)?: None Help needed to walk in hospital room?: None Help needed climbing 3-5 steps with a railing? : None 6 Click Score: 24    End of Session Equipment Utilized  During Treatment: Gait belt Activity Tolerance: Patient tolerated treatment well Patient left: in chair;with call bell/phone within reach;with chair alarm set   PT Visit Diagnosis: Muscle weakness (generalized) (M62.81);Difficulty in walking, not elsewhere classified (R26.2)     Time: 4259-5638 PT Time Calculation (min) (ACUTE ONLY): 40 min  Charges:  $Gait Training: 8-22 mins $Therapeutic Exercise: 8-22 mins $Therapeutic Activity: 8-22 mins                     Kreg Shropshire, DPT 05/21/2018, 12:00 PM

## 2018-05-21 NOTE — Progress Notes (Signed)
   Subjective: 2 Days Post-Op Procedure(s) (LRB): TOTAL HIP ARTHROPLASTY (Right) Patient reports pain as level 2 to the anterior thigh of the level 8 to the incision site but is somewhat improved with taking oxycodone. Patient is well, and has had no acute complaints or problems Patient did do well with day 1 of therapy.  Was able to ambulate around the nurses desk.  Still needs to do stairs prior to being discharged Plan is to go Home after hospital stay. no nausea and no vomiting Patient denies any chest pains or shortness of breath. Objective: Vital signs in last 24 hours: Temp:  [97.7 F (36.5 C)-98.4 F (36.9 C)] 97.8 F (36.6 C) (11/29 0508) Pulse Rate:  [72-100] 76 (11/29 0508) Resp:  [18-20] 20 (11/29 0508) BP: (122-145)/(72-86) 138/86 (11/29 0508) SpO2:  [91 %-96 %] 91 % (11/29 0508) well approximated incision Heels are non tender and elevated off the bed using rolled towels Intake/Output from previous day: 11/28 0701 - 11/29 0700 In: 3784.1 [P.O.:240; I.V.:686; IV Piggyback:2858.1] Out: 366 [Drains:365; Stool:1] Intake/Output this shift: Total I/O In: -  Out: 106 [Drains:105; Stool:1]  No results for input(s): HGB in the last 72 hours. No results for input(s): WBC, RBC, HCT, PLT in the last 72 hours. No results for input(s): NA, K, CL, CO2, BUN, CREATININE, GLUCOSE, CALCIUM in the last 72 hours. No results for input(s): LABPT, INR in the last 72 hours.  EXAM General - Patient is Alert, Appropriate and Oriented Extremity - Neurologically intact Neurovascular intact Sensation intact distally Intact pulses distally Dorsiflexion/Plantar flexion intact No cellulitis present Compartment soft Dressing - no drainage Motor Function - intact, moving foot and toes well on exam.    Past Medical History:  Diagnosis Date  . Anxiety   . Arthritis   . Chronic insomnia   . COPD (chronic obstructive pulmonary disease) ([place])   . Dyspnea   . Esophageal stricture   .  Esophageal varices ([place])   . GERD (gastroesophageal reflux disease)   . Hyperlipidemia   . Hypertension   . Joint ache   . Pre-diabetes   . Reflux esophagitis   . Sleep apnea     Assessment/Plan: 2 Days Post-Op Procedure(s) (LRB): TOTAL HIP ARTHROPLASTY (Right) Active Problems:   H/O total hip arthroplasty  Estimated body mass index is 38.77 kg/m as calculated from the following:   Height as of this encounter: 6\' 2"  (1.88 m).   Weight as of 05/05/18: 137 kg. Up with therapy Discharge home with home health  Labs: None DVT Prophylaxis - Lovenox, Foot Pumps and TED hose Weight-Bearing as tolerated to right leg Hemovac was discontinued this morning.  Into the drain appeared to be intact. Patient may be discharged to home once he does steps with physical therapy Please give the patient 2 extra honeycomb dressings to take home  [place]. [facility] 05/21/2018, 6:53 AM

## 2018-05-24 LAB — SURGICAL PATHOLOGY

## 2018-05-27 ENCOUNTER — Telehealth: Payer: Self-pay

## 2018-05-27 NOTE — Telephone Encounter (Signed)
EMMI Follow-up: Noted on the report that the patient wasn't sure who to call if there was a change in his condition.  I talked with Johnny Williams and he said blood had bled through his dressing and he called the clinic and was told to go ahead and change bandage so was aware to follow up with MD at the clinic. He asked when would he need to have the staples removed and if it was okay to take his Celebrex and Oxycodone together.  I referred him to call Dr. Clydell Hakim office and check with his RN about those two questions and he said he would. No other needs noted.

## 2018-07-23 ENCOUNTER — Telehealth: Payer: Self-pay | Admitting: *Deleted

## 2018-07-23 DIAGNOSIS — Z122 Encounter for screening for malignant neoplasm of respiratory organs: Secondary | ICD-10-CM

## 2018-07-23 DIAGNOSIS — Z87891 Personal history of nicotine dependence: Secondary | ICD-10-CM

## 2018-07-23 NOTE — Telephone Encounter (Signed)
Received referral for initial lung cancer screening scan. Contacted patient and obtained smoking history,(current, 80 pack year) as well as answering questions related to screening process. Patient denies signs of lung cancer such as weight loss or hemoptysis. Patient denies comorbidity that would prevent curative treatment if lung cancer were found. Patient is scheduled for shared decision making visit and CT scan on 08/05/18 at 145.

## 2018-08-04 ENCOUNTER — Telehealth: Payer: Self-pay | Admitting: *Deleted

## 2018-08-04 NOTE — Telephone Encounter (Signed)
CALLED PT TO REMIND HIM OF HIS APPT FOR LDCT SCREENING ON 08-05-2018 @1345 , NO ANSWER UNABLE TO LEAVE MESSAGE

## 2018-08-05 ENCOUNTER — Ambulatory Visit
Admission: RE | Admit: 2018-08-05 | Discharge: 2018-08-05 | Disposition: A | Discharge status: Home / Self Care | Payer: Medicaid Other | Source: Ambulatory Visit | Attending: Nurse Practitioner | Admitting: Nurse Practitioner

## 2018-08-05 ENCOUNTER — Inpatient Hospital Stay: Payer: Medicaid Other | Admitting: Oncology

## 2018-08-05 ENCOUNTER — Ambulatory Visit: Payer: Medicaid Other

## 2018-08-05 ENCOUNTER — Inpatient Hospital Stay: Payer: Medicaid Other | Attending: Nurse Practitioner | Admitting: Oncology

## 2018-08-05 ENCOUNTER — Encounter: Payer: Self-pay | Admitting: Nurse Practitioner

## 2018-08-05 DIAGNOSIS — Z122 Encounter for screening for malignant neoplasm of respiratory organs: Secondary | ICD-10-CM | POA: Diagnosis not present

## 2018-08-05 DIAGNOSIS — Z87891 Personal history of nicotine dependence: Secondary | ICD-10-CM

## 2018-08-05 NOTE — Progress Notes (Signed)
In accordance with CMS guidelines, patient has met eligibility criteria including age, absence of signs or symptoms of lung cancer.  Social History   Tobacco Use  . Smoking status: Current Every Day Smoker    Packs/day: 2.00    Years: 40.00    Pack years: 80.00  . Smokeless tobacco: Never Used  . Tobacco comment: past history of 4 day  Substance Use Topics  . Alcohol use: Not Currently  . Drug use: Never    Comment: Hemp oil for pain     A shared decision-making session was conducted prior to the performance of CT scan. This includes one or more decision aids, includes benefits and harms of screening, follow-up diagnostic testing, over-diagnosis, false positive rate, and total radiation exposure.  Counseling on the importance of adherence to annual lung cancer LDCT screening, impact of co-morbidities, and ability or willingness to undergo diagnosis and treatment is imperative for compliance of the program.  Counseling on the importance of continued smoking cessation for former smokers; the importance of smoking cessation for current smokers, and information about tobacco cessation interventions have been given to patient including [place] and 1800 quit [place] programs.  Written order for lung cancer screening with LDCT has been given to the patient and any and all questions have been answered to the best of my abilities.   Yearly follow up will be coordinated by Burgess Estelle, Thoracic Navigator.  Faythe Casa, NP 08/05/2018 3:03 PM

## 2018-08-06 ENCOUNTER — Telehealth: Payer: Self-pay | Admitting: *Deleted

## 2018-08-06 NOTE — Telephone Encounter (Signed)
Notified patient of LDCT lung cancer screening program results with recommendation for 12 month follow up imaging. Also notified of incidental findings noted below and is encouraged to discuss further with PCP who will receive a copy of this note and/or the CT report. Patient verbalizes understanding.   IMPRESSION: 1. Lung-RADS 2S, benign appearance or behavior. Continue annual screening with low-dose chest CT without contrast in 12 months. 2. The "S" modifier above refers to potentially clinically significant non lung cancer related findings. Specifically, there is aortic atherosclerosis, in addition to 2 vessel coronary artery disease. Please note that although the presence of coronary artery calcium documents the presence of coronary artery disease, the severity of this disease and any potential stenosis cannot be assessed on this non-gated CT examination. Assessment for potential risk factor modification, dietary therapy or pharmacologic therapy may be warranted, if clinically indicated. 3. Mild diffuse bronchial wall thickening with mild centrilobular and paraseptal emphysema; imaging findings suggestive of underlying COPD. 4. Cholelithiasis.  Aortic Atherosclerosis (ICD10-I70.0) and Emphysema (ICD10-J43.9).

## 2018-08-09 ENCOUNTER — Encounter: Payer: Self-pay | Admitting: *Deleted

## 2019-07-07 DIAGNOSIS — I2510 Atherosclerotic heart disease of native coronary artery without angina pectoris: Secondary | ICD-10-CM | POA: Insufficient documentation

## 2019-07-07 DIAGNOSIS — K8020 Calculus of gallbladder without cholecystitis without obstruction: Secondary | ICD-10-CM | POA: Insufficient documentation

## 2019-07-07 DIAGNOSIS — E669 Obesity, unspecified: Secondary | ICD-10-CM | POA: Insufficient documentation

## 2019-07-07 DIAGNOSIS — E538 Deficiency of other specified B group vitamins: Secondary | ICD-10-CM | POA: Insufficient documentation

## 2019-07-07 DIAGNOSIS — I700 Atherosclerosis of aorta: Secondary | ICD-10-CM | POA: Insufficient documentation

## 2019-07-18 ENCOUNTER — Encounter: Payer: Self-pay | Admitting: Physician Assistant

## 2019-07-27 ENCOUNTER — Telehealth: Payer: Self-pay

## 2019-07-27 DIAGNOSIS — Z87891 Personal history of nicotine dependence: Secondary | ICD-10-CM

## 2019-07-27 NOTE — Telephone Encounter (Signed)
Left message for patient to notify them that it is time to schedule annual low dose lung cancer screening CT scan. Instructed patient to call back ([phone]) to verify information prior to the scan being scheduled.  

## 2019-07-28 ENCOUNTER — Encounter: Payer: Self-pay | Admitting: Hematology and Oncology

## 2019-07-28 ENCOUNTER — Telehealth: Payer: Self-pay

## 2019-07-28 ENCOUNTER — Other Ambulatory Visit: Payer: Self-pay

## 2019-07-28 NOTE — Telephone Encounter (Signed)
Patient has been notified that annual lung cancer screening low dose CT scan is due currently or will be in near future. Confirmed that patient is within the age range of 55-77, and asymptomatic, (no signs or symptoms of lung cancer). Patient denies illness that would prevent curative treatment for lung cancer if found. Verified smoking history, (current, 83 pack year). The shared decision making visit was done 08/05/18. Patient is agreeable for CT scan being scheduled.

## 2019-07-28 NOTE — Telephone Encounter (Signed)
Spoke with the patient to inform him of his appointment time and date. The patient was aware of his dx and he was aware with where the facility was located. I have inform him of what provider he will see tomorrow and what to expect a s a new patient. The patient was understanding and agreeable.

## 2019-07-28 NOTE — Addendum Note (Signed)
Addended by: Lieutenant Diego on: 07/28/2019 10:17 AM   Modules accepted: Orders

## 2019-07-28 NOTE — Progress Notes (Signed)
Constant pain noted to bilateral legs ( pain level 6). The patient Name and DOB has been verified by phone today.

## 2019-07-28 NOTE — Progress Notes (Signed)
[facility]  [address] [place], [place] [address] Phone: [phone]  Fax: [phone]   Clinic Day:  07/29/2019  Referring physician: Ulyess Blossom, PA  Chief Complaint: Johnny Williams is a 59 y.o. male with polycythemia who is referred in consultation by Ulyess Blossom, PA for assessment and management.   HPI: The patient has a history of smoking and sleep apnea.  He was seen by Rulon Abide on 08/05/2018. He has an 80+ pack year smoking history. He was enrolled in the low dose CT program. Low dose chest CT on 08/05/2018 showed lung-RADS 2S, benign appearance or behavior. There was mild diffuse bronchial wall thickening with mild centrilobular and paraseptal emphysema.  Imaging findings were suggestive of underlying COPD and cholelithiasis.  CBC followed 11/08/2013: Hematocrit 50.1, hemoglobin 17.1, MCV 83.5, platelets 123,000, WBC 8700, ANC 5960.  05/05/2018: Hematocrit 52.1, hemoglobin 16.7, MCV 87.9, WBC 10900.  07/07/2019: Hematocrit 51.2, hemoglobin 17.2, MCV 86.0, platelets 143,000, WBC 10500. 07/21/2019: Hematocrit 52.9, hemoglobin 17.6, MCV 86.0, platelets 141,000, WBC 8100, ANC 5500.   Urinalysis on 05/05/2018 revealed 6-10 RBC/HPF.    Symptomatically, he feels fine aside from chronic leg pain and low BP.  He recently changed physicians from [facility] to [facility] on 07/07/2019.  He denies any new medications or herbal products except for Tumeric.  His cholesterol medication was increased; he was told to take aspirin. He doesn't take testosterone.  He denies any pruritus, erythromelalgia, thrombosis, unexplained bruising or bleeding, headache, blurred vision, or paresthesias.  He smokes 2 packs of cigarettes/day.  He doesn't remember when his sleep study was done;  He never received CPAP machine.  He snores. He believes it was a waste of time enough though the study showed sleep apnea. He will wake up around 3-4 in the  morning make breakfast and will go back to sleep. He notes no energy on waking up for the past 5 years.   He underwent total right hip arthoplasty on 05/19/2018.  After his surgery he describes transient numbness in his entire right hand that then went into his left 4th and 5th digit fingers.  The numbness then went to his head and has since resolved.  His mother and maternal aunt both have CHF.  His brother, who is 8 years older than him, is seen in [facility] in [place].    Past Medical History:  Diagnosis Date  . Anxiety   . Arthritis   . Chronic insomnia   . COPD (chronic obstructive pulmonary disease) ([place])   . Dyspnea   . Esophageal stricture   . Esophageal varices ([place])   . GERD (gastroesophageal reflux disease)   . Hyperlipidemia   . Hypertension   . Joint ache   . Pre-diabetes   . Reflux esophagitis   . Sleep apnea     Past Surgical History:  Procedure Laterality Date  . TOTAL HIP ARTHROPLASTY Right 05/19/2018   Procedure: TOTAL HIP ARTHROPLASTY;  Surgeon: Dereck Leep, MD;  Location: [facility] ORS;  Service: Orthopedics;  Laterality: Right;    Family History  Problem Relation Age of Onset  . Hypertension Mother   . Heart disease Mother     Social History:  reports that he has been smoking. He has a 80.00 pack-year smoking history. He has never used smokeless tobacco. He reports previous alcohol use. He reports that he does not use drugs.  He smoked 2.5 - 4 packs/day x 10 years. He started smoking  when he was 34. He now smokes 2-3 ppd.  His brother works at at cigarette company in [place]. He gets a carton and 4 packs from his brother every Sunday. He won't buy extra during the week if he runs out.  He lives in [name]. 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  The patient is alone today.  Allergies:  Allergies  Allergen Reactions  . Fluoxetine     Insomnia    Current Medications: Current Outpatient Medications  Medication Sig Dispense Refill    . acetaminophen (TYLENOL) 650 MG CR tablet Take 1,300 mg by mouth 3 (three) times daily.     . Ascorbic Acid (VITAMIN C) 1000 MG tablet Take 1,000 mg by mouth 3 (three) times daily.    Marland Kitchen atorvastatin (LIPITOR) 40 MG tablet Take 40 mg by mouth daily.    . calcium carbonate (TUMS - DOSED IN MG ELEMENTAL CALCIUM) 500 MG chewable tablet Chew 2 tablets by mouth daily.    Marland Kitchen CALCIUM-MAGNESIUM-ZINC PO Take 1 tablet by mouth daily.    . celecoxib (CELEBREX) 200 MG capsule Take 1 capsule (200 mg total) by mouth 2 (two) times daily. 60 capsule 0  . Cholecalciferol (VITAMIN D3) 125 MCG (5000 UT) CAPS Take 1 capsule by mouth daily.    . hydrochlorothiazide (HYDRODIURIL) 25 MG tablet Take 25 mg by mouth daily.    . Multiple Vitamins-Minerals (ONE-A-DAY 50 PLUS PO) Take by mouth.    Marland Kitchen omeprazole (PRILOSEC) 20 MG capsule Take 20 mg by mouth daily.    . verapamil (VERELAN PM) 240 MG 24 hr capsule Take 240 mg by mouth daily.     . vitamin B-12 (CYANOCOBALAMIN) 1000 MCG tablet Take 2,000 mcg by mouth daily.    . Alpha-D-Galactosidase (BEANO) TABS Take 800 mg by mouth daily as needed.    . Aspirin-Salicylamide-Caffeine (ARTHRITIS STRENGTH BC POWDER PO) Take 1 packet by mouth every 4 (four) hours.     . diclofenac sodium (VOLTAREN) 1 % GEL Apply 2 g topically 4 (four) times daily.     . diphenhydrAMINE (BENADRYL) 25 MG tablet Take 25 mg by mouth daily as needed.    . docusate sodium (COLACE) 100 MG capsule Take 100 mg by mouth 2 (two) times daily.    Marland Kitchen enoxaparin (LOVENOX) 40 MG/0.4ML injection Inject 0.4 mLs (40 mg total) into the skin daily. 14 Syringe 0  . Liniments (ABSORBINE ARTHRITIS STRENGTH EX) Apply topically.    . Menthol, Topical Analgesic, (ABSORBINE PAIN RELIEVING EX) Apply 1 application topically as needed.    . Menthol, Topical Analgesic, (ICY HOT EX) Apply 1 application topically as needed.    Marland Kitchen oxyCODONE (OXY IR/ROXICODONE) 5 MG immediate release tablet Take 1 tablet (5 mg total) by mouth every 4  (four) hours as needed for moderate pain (pain score 4-6). 40 tablet 0  . Simethicone 125 MG TABS Take 2 tablets by mouth as needed.    . Simethicone 180 MG CAPS Take 2 capsules by mouth as needed.    . traMADol (ULTRAM) 50 MG tablet Take 1-2 tablets (50-100 mg total) by mouth every 4 (four) hours as needed for moderate pain. 40 tablet 1  . varenicline (CHANTIX) 1 MG tablet Take 1 mg by mouth 2 (two) times daily.     No current facility-administered medications for this visit.    Review of Systems  Constitutional: Positive for malaise/fatigue. Negative for chills, fever and weight loss.       "No energy  for 5 years".  HENT: Negative.  Negative for hearing loss and sore throat.   Eyes: Negative for blurred vision and double vision.       Reading glasses.  Gabapentin caused left eye pain.  He has an ophthalmology appointment on 08/01/2019.   Respiratory: Positive for shortness of breath. Negative for cough and sputum production.        Sleep apnea.  Cardiovascular: Negative for chest pain, palpitations and leg swelling.  Gastrointestinal: Positive for heartburn (managed with medication). Negative for abdominal pain, blood in stool, constipation, diarrhea, melena, nausea and vomiting.       Lyrica caused blood in the stool.  Genitourinary: Negative for dysuria, frequency, hematuria and urgency.  Musculoskeletal: Positive for joint pain (both hips hurt) and myalgias (chronic leg pain; sciatic nerve pain on left side). Negative for back pain, falls and neck pain.  Skin: Negative.  Negative for itching and rash.  Neurological: Positive for weakness (leg weakness). Negative for dizziness, sensory change, speech change, focal weakness and headaches.  Endo/Heme/Allergies: Negative.  Does not bruise/bleed easily.  Psychiatric/Behavioral: Negative.  Negative for depression, memory loss and substance abuse. The patient does not have insomnia.    Performance status (ECOG):  1  Vitals Blood pressure  98/78, pulse 94, temperature (!) 97.5 F (36.4 C), weight 294 lb 5 oz (133.5 kg), SpO2 97 %.   Physical Exam  Constitutional: He is oriented to person, place, and time. He appears well-developed and well-nourished. No distress.  Heavyset gentleman sitting comfortably in the exam room in no acute distress.  HENT:  Head: Normocephalic and atraumatic.  Mouth/Throat: Oropharynx is clear and moist. No oropharyngeal exudate.  Male pattern baldness.  Lu Duffel.  Mask.  Eyes: Pupils are equal, round, and reactive to light. Conjunctivae and EOM are normal. No scleral icterus.  Neck: No JVD present.  Cardiovascular: Normal rate and normal heart sounds.  No murmur heard. Pulmonary/Chest: Effort normal and breath sounds normal. No respiratory distress. He has no wheezes. He has no rales. He exhibits no tenderness.  Abdominal: Soft. Bowel sounds are normal. He exhibits no distension. There is no abdominal tenderness.  Fully round.  No appreciable hepatosplenomegaly.  Musculoskeletal:        General: Edema (chronic 1+) present. No tenderness. Normal range of motion.     Cervical back: Normal range of motion and neck supple.  Lymphadenopathy:    He has no cervical adenopathy.  Neurological: He is alert and oriented to person, place, and time.  Skin: Skin is warm and dry. He is not diaphoretic. There is erythema (mild facial). No pallor.  Psychiatric: He has a normal mood and affect. His behavior is normal. Judgment and thought content normal.  Nursing note and vitals reviewed.   No visits with results within 3 Day(s) from this visit.  Latest known visit with results is:  Admission on 05/19/2018, Discharged on 05/21/2018  Component Date Value Ref Range Status  . ABO/RH(D) 05/19/2018    Final                   Value:B POS Performed at [facility], [address] [place], [place] [address]   . Glucose-Capillary 05/19/2018 116* 70 - 99 mg/dL Final  . SURGICAL PATHOLOGY 05/19/2018     Final                   Value:Surgical Pathology CASE: [id number] PATIENT: Darcus Pester Surgical Pathology Report   SPECIMEN SUBMITTED: A. Femoral head,  right  CLINICAL HISTORY: None provided  PRE-OPERATIVE DIAGNOSIS: Primary osteoarthritis of right hip  POST-OPERATIVE DIAGNOSIS: Same as pre-op  DIAGNOSIS: A.  FEMORAL HEAD, RIGHT; ARTHROPLASTY: - OSTEOARTHROSIS.   GROSS DESCRIPTION: A. Labeled: Right femoral head Received: In formalin Size of specimen:      Head -5.4 x 5.3 cm      Neck -4.6 x 3.2 cm Articular surface: Predominantly granular tan-brown with focal eburnation Cut surface: Yellow to brown Other findings: None noted  Block summary: 1 - representative section  Tissue decalcification: Yes  Final Diagnosis performed by Quay Burow, MD.   Electronically signed 05/24/2018 9:23:54AM The electronic signature indicates that the named Attending Pathologist has evaluated the specimen  Technical component performed at [facility], [address] [place], [place]                         [place] Lab: [phone] Dir: Rush Farmer, MD, MMM  Professional component performed at [facility], [facility], [place], [place], [place] [address] Lab: [id number] Dir: Dellia Nims. Reuel Derby, MD  . Glucose-Capillary 05/19/2018 126* 70 - 99 mg/dL Final    Assessment:  Commodore Montemurro is a 59 y.o. 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  He has COPD and an 80+ pack year smoking history.  He has untreated sleep apnea.  CBC on 07/21/2019 revealed a hematocrit 52.9, hemoglobin 17.6, MCV 86.0, platelets 141,000, WBC 8100, ANC 5500.  Urinalysis on 05/05/2018 revealed 6-10 RBC/HPF.   He smokes and is in the low dose CT program.  Low dose chest CT on 08/05/2018 showed lung-RADS 2S, benign appearance or behavior. There was mild diffuse bronchial wall thickening with mild centrilobular and paraseptal emphysema.  Imaging findings were suggestive of underlying COPD and  cholelithiasis.  Symptomatically, he is fatigued.  Exam reveals no appreciable hepatosplenomegaly.  Plan: 1.   Labs today:  CBC with diff, CMP, carbon monoxide level, testosterone level, epo level, JAK2 [id number]. 2.   Urinalysis. 3.   Erythrocytosis  Hemoglobin > 16.5.  WBC normal.  Platelets slightly low.  Etiology felt secondary to smoking and sleep apnea.   Suspect erythrocytosis is an appropriate response to hypoxia.    Encourage sleep apnea testing and smoking cessation.   Doubt a myeloproliferative disorder.  Discuss consideration of small volume phlebotomy for symptoms related to hyperviscosity (fatigue, headache, blurred vision, paresthesias). 4.   Smoking  Discuss smoking cessation.  Discuss plan for follow-up low dose chest CT around 08/06/2019. 5.   RTC in 10-14 days for MD assessment and review of work-up.  I discussed the assessment and treatment plan with the patient.  The patient was provided an opportunity to ask questions and all were answered.  The patient agreed with the plan and demonstrated an understanding of the instructions.  The patient was advised to call back if the symptoms worsen or if the condition fails to improve as anticipated.  I provided 30 minutes (2:53 PM - 3:23 PM) of face-to-face time during this this encounter and > 50% was spent counseling as documented under my assessment and plan.  Approximately 15 minutes were spent reviewing his chart in [place].   Egan Berkheimer C. Mike Gip, MD, PhD    07/29/2019, 3:23 PM  I, Samul Dada, am acting as scribe for [employer]. Mike Gip, MD, PhD.  I, Gerrica Cygan C. Mike Gip, MD, have reviewed the above documentation for accuracy and completeness, and I agree with the above.

## 2019-07-29 ENCOUNTER — Encounter: Payer: Self-pay | Admitting: Hematology and Oncology

## 2019-07-29 ENCOUNTER — Inpatient Hospital Stay: Payer: Medicaid Other

## 2019-07-29 ENCOUNTER — Inpatient Hospital Stay: Payer: Medicaid Other | Attending: Hematology and Oncology | Admitting: Hematology and Oncology

## 2019-07-29 VITALS — BP 98/78 | HR 94 | Temp 97.5°F | Wt 294.3 lb

## 2019-07-29 DIAGNOSIS — G4730 Sleep apnea, unspecified: Secondary | ICD-10-CM | POA: Insufficient documentation

## 2019-07-29 DIAGNOSIS — I10 Essential (primary) hypertension: Secondary | ICD-10-CM | POA: Diagnosis not present

## 2019-07-29 DIAGNOSIS — F17210 Nicotine dependence, cigarettes, uncomplicated: Secondary | ICD-10-CM | POA: Diagnosis not present

## 2019-07-29 DIAGNOSIS — J449 Chronic obstructive pulmonary disease, unspecified: Secondary | ICD-10-CM | POA: Insufficient documentation

## 2019-07-29 DIAGNOSIS — D751 Secondary polycythemia: Secondary | ICD-10-CM

## 2019-07-29 LAB — COMPREHENSIVE METABOLIC PANEL
ALT: 29 U/L (ref 0–44)
AST: 24 U/L (ref 15–41)
Albumin: 4.5 g/dL (ref 3.5–5.0)
Alkaline Phosphatase: 73 U/L (ref 38–126)
Anion gap: 14 (ref 5–15)
BUN: 23 mg/dL — ABNORMAL HIGH (ref 6–20)
CO2: 22 mmol/L (ref 22–32)
Calcium: 9.8 mg/dL (ref 8.9–10.3)
Chloride: 100 mmol/L (ref 98–111)
Creatinine, Ser: 0.79 mg/dL (ref 0.61–1.24)
GFR calc Af Amer: >60 mL/min (ref >60)
GFR calc non Af Amer: >60 mL/min (ref >60)
Glucose, Bld: 106 mg/dL — ABNORMAL HIGH (ref 70–99)
Potassium: 3.6 mmol/L (ref 3.5–5.1)
Sodium: 136 mmol/L (ref 135–145)
Total Bilirubin: 0.8 mg/dL (ref 0.3–1.2)
Total Protein: 8.2 g/dL — ABNORMAL HIGH (ref 6.5–8.1)

## 2019-07-29 LAB — CBC WITH DIFFERENTIAL/PLATELET
Abs Immature Granulocytes: 0.04 10*3/uL (ref 0.00–0.07)
Basophils Absolute: 0 10*3/uL (ref 0.0–0.1)
Basophils Relative: 0 %
Eosinophils Absolute: 0.1 10*3/uL (ref 0.0–0.5)
Eosinophils Relative: 1 %
HCT: 55 % — ABNORMAL HIGH (ref 39.0–52.0)
Hemoglobin: 18.5 g/dL — ABNORMAL HIGH (ref 13.0–17.0)
Immature Granulocytes: 0 %
Lymphocytes Relative: 19 %
Lymphs Abs: 2 10*3/uL (ref 0.7–4.0)
MCH: 28.8 pg (ref 26.0–34.0)
MCHC: 33.6 g/dL (ref 30.0–36.0)
MCV: 85.7 fL (ref 80.0–100.0)
Monocytes Absolute: 0.9 10*3/uL (ref 0.1–1.0)
Monocytes Relative: 8 %
Neutro Abs: 7.5 10*3/uL (ref 1.7–7.7)
Neutrophils Relative %: 72 %
Platelets: 148 10*3/uL — ABNORMAL LOW (ref 150–400)
RBC: 6.42 MIL/uL — ABNORMAL HIGH (ref 4.22–5.81)
RDW: 13.2 % (ref 11.5–15.5)
WBC: 10.5 10*3/uL (ref 4.0–10.5)
nRBC: 0 % (ref 0.0–0.2)

## 2019-07-29 LAB — URINALYSIS, COMPLETE (UACMP) WITH MICROSCOPIC
Bilirubin Urine: NEGATIVE
Glucose, UA: NEGATIVE mg/dL
Hgb urine dipstick: NEGATIVE
Leukocytes,Ua: NEGATIVE
Nitrite: NEGATIVE
Protein, ur: >300 mg/dL — AB
Specific Gravity, Urine: >1.03 — ABNORMAL HIGH (ref 1.005–1.030)
pH: 5 (ref 5.0–8.0)

## 2019-07-30 LAB — TESTOSTERONE: Testosterone: 487 ng/dL (ref 264–916)

## 2019-07-30 LAB — ERYTHROPOIETIN: Erythropoietin: 7.1 m[IU]/mL (ref 2.6–18.5)

## 2019-08-01 LAB — CARBON MONOXIDE, BLOOD (PERFORMED AT REF LAB): Carbon Monoxide, Blood: 12 % — ABNORMAL HIGH (ref 0.0–3.6)

## 2019-08-08 LAB — JAK2 EXONS 12-15

## 2019-08-08 LAB — JAK2  V617F QUAL. WITH REFLEX TO EXON 12: Reflex:: 15

## 2019-08-09 ENCOUNTER — Other Ambulatory Visit: Payer: Self-pay

## 2019-08-09 ENCOUNTER — Ambulatory Visit
Admission: RE | Admit: 2019-08-09 | Discharge: 2019-08-09 | Disposition: A | Discharge status: Home / Self Care | Payer: Medicaid Other | Source: Ambulatory Visit | Attending: Nurse Practitioner | Admitting: Nurse Practitioner

## 2019-08-09 DIAGNOSIS — Z87891 Personal history of nicotine dependence: Secondary | ICD-10-CM | POA: Diagnosis present

## 2019-08-12 ENCOUNTER — Encounter: Payer: Self-pay | Admitting: *Deleted

## 2019-08-15 ENCOUNTER — Other Ambulatory Visit: Payer: Self-pay

## 2019-08-15 ENCOUNTER — Ambulatory Visit (INDEPENDENT_AMBULATORY_CARE_PROVIDER_SITE_OTHER): Payer: Medicaid Other | Admitting: Gastroenterology

## 2019-08-15 ENCOUNTER — Encounter: Payer: Self-pay | Admitting: Gastroenterology

## 2019-08-15 VITALS — BP 150/82 | HR 96 | Temp 97.2°F | Ht 73.0 in | Wt 298.2 lb

## 2019-08-15 DIAGNOSIS — I8500 Esophageal varices without bleeding: Secondary | ICD-10-CM

## 2019-08-15 DIAGNOSIS — Z1211 Encounter for screening for malignant neoplasm of colon: Secondary | ICD-10-CM

## 2019-08-15 NOTE — Progress Notes (Signed)
Gastroenterology Consultation  Referring Provider:     Ulyess Blossom, PA Primary Care Physician:  Ulyess Blossom, [place] Primary Gastroenterologist:  Dr. Allen Norris     Reason for Consultation:     Possible esophageal varices        HPI:   Johnny Williams is a 59 y.o. y/o male referred for consultation & management of possible esophageal varices by Dr. Janyce Llanos, Eppie Gibson, PA.  This patient comes in today after having an upper endoscopy by Dr. Vira Agar many years ago and was suggested to have esophageal varices.  The patient has esophageal varices on his problem list and reports that he switched doctors from [facility] to the [facility] [facility].  The patient denies any alcohol abuse or being told that he had abnormal liver enzymes in the past.  The patient did have an esophageal stricture at that time and was sent to [facility] where he had dilation and an EUS.  The patient is being seen by hematology for polycythemia and reports that he continues to smoke.  The patient also reports that he has gained a substantial amount of weight since having hip surgery.  He denies ever having a screening colonoscopy but states that he has stool cards sent off periodically for blood.  There is no report of any nausea vomiting fevers or chills.  The patient is now sent to me for evaluation of his possible esophageal varices.  He did recently have a CT scan of the chest which did not show any sign of portal hypertension.  Past Medical History:  Diagnosis Date  . Anxiety   . Arthritis   . Chronic insomnia   . COPD (chronic obstructive pulmonary disease) ([place])   . Dyspnea   . Esophageal stricture   . Esophageal varices ([place])   . GERD (gastroesophageal reflux disease)   . Hyperlipidemia   . Hypertension   . Joint ache   . Pre-diabetes   . Reflux esophagitis   . Sleep apnea     Past Surgical History:  Procedure Laterality Date  . TOTAL HIP ARTHROPLASTY Right 05/19/2018   Procedure: TOTAL HIP ARTHROPLASTY;   Surgeon: Dereck Leep, MD;  Location: [facility] ORS;  Service: Orthopedics;  Laterality: Right;    Prior to Admission medications   Medication Sig Start Date End Date Taking? Authorizing Provider  acetaminophen (TYLENOL) 650 MG CR tablet Take 1,300 mg by mouth 3 (three) times daily.    Yes [provider]  Ascorbic Acid (VITAMIN C) 1000 MG tablet Take 1,000 mg by mouth 3 (three) times daily.   Yes [provider]  aspirin 81 MG EC tablet Take by mouth.   Yes [provider]  Aspirin-Salicylamide-Caffeine (ARTHRITIS STRENGTH BC POWDER PO) Take 1 packet by mouth every 4 (four) hours.    Yes [provider]  atorvastatin (LIPITOR) 40 MG tablet Take 40 mg by mouth daily.   Yes [provider]  calcium carbonate (TUMS - DOSED IN MG ELEMENTAL CALCIUM) 500 MG chewable tablet Chew 2 tablets by mouth daily.   Yes [provider]  CALCIUM-MAGNESIUM-ZINC PO Take 1 tablet by mouth daily.   Yes [provider]  celecoxib (CELEBREX) 200 MG capsule Take 1 capsule (200 mg total) by mouth 2 (two) times daily. 05/20/18  Yes Reche Dixon, PA-C  Cholecalciferol (VITAMIN D3) 125 MCG (5000 UT) CAPS Take 1 capsule by mouth daily.   Yes [provider]  hydrochlorothiazide (HYDRODIURIL) 25 MG tablet Take 25 mg by  mouth daily.   Yes [provider]  Liniments (ABSORBINE ARTHRITIS STRENGTH EX) Apply topically.   Yes [provider]  Menthol, Topical Analgesic, (ABSORBINE PAIN RELIEVING EX) Apply 1 application topically as needed.   Yes [provider]  Menthol, Topical Analgesic, (ICY HOT EX) Apply 1 application topically as needed.   Yes [provider]  Multiple Vitamins-Minerals (ONE-A-DAY 50 PLUS PO) Take by mouth.   Yes [provider]  omeprazole (PRILOSEC) 20 MG capsule Take 20 mg by mouth daily.   Yes [provider]  diclofenac sodium (VOLTAREN) 1 % GEL Apply 2 g topically 4 (four) times  daily.     [provider]  verapamil (VERELAN PM) 240 MG 24 hr capsule Take 240 mg by mouth daily.     [provider]  vitamin B-12 (CYANOCOBALAMIN) 1000 MCG tablet Take 2,000 mcg by mouth daily.    [provider]    Family History  Problem Relation Age of Onset  . Hypertension Mother   . Heart disease Mother      Social History   Tobacco Use  . Smoking status: Current Every Day Smoker    Packs/day: 2.00    Years: 40.00    Pack years: 80.00  . Smokeless tobacco: Never Used  . Tobacco comment: past history of 4 day  Substance Use Topics  . Alcohol use: Not Currently  . Drug use: Never    Comment: Hemp oil for pain    Allergies as of 08/15/2019 - Review Complete 08/15/2019  Allergen Reaction Noted  . Fluoxetine  11/07/2013    Review of Systems:    All systems reviewed and negative except where noted in HPI.   Physical Exam:  BP (!) 150/82   Pulse 96   Temp (!) 97.2 F (36.2 C) (Temporal)   Ht 6\' 1"  (1.854 m)   Wt 298 lb 3.2 oz (135.3 kg)   BMI 39.34 kg/m  No LMP for male patient. General:   Alert,  Well-developed, well-nourished, pleasant and cooperative in NAD Head:  Normocephalic and atraumatic. Eyes:  Sclera clear, no icterus.   Conjunctiva pink. Ears:  Normal auditory acuity. Neck:  Supple; no masses or thyromegaly. Lungs:  Respirations even and unlabored.  Clear throughout to auscultation.   No wheezes, crackles, or rhonchi. No acute distress. Heart:  Regular rate and rhythm; no murmurs, clicks, rubs, or gallops. Abdomen:  Normal bowel sounds.  No bruits.  Soft, non-tender and non-distended without masses, hepatosplenomegaly or hernias noted.  No guarding or rebound tenderness.  Negative Carnett sign.   Rectal:  Deferred.  Pulses:  Normal pulses noted. Extremities:  No clubbing or edema.  No cyanosis. Neurologic:  Alert and oriented x3;  grossly normal neurologically. Skin:  Intact without significant lesions or rashes.  No  jaundice. Lymph Nodes:  No significant cervical adenopathy. Psych:  Alert and cooperative. Normal mood and affect.  Imaging Studies: CT CHEST LUNG CANCER SCREENING LOW DOSE WO CONTRAST  Result Date: 08/09/2019 CLINICAL DATA:  59 year old male current smoker with 43 pack-year history of smoking. Lung cancer screening examination. EXAM: CT CHEST WITHOUT CONTRAST LOW-DOSE FOR LUNG CANCER SCREENING TECHNIQUE: Multidetector CT imaging of the chest was performed following the standard protocol without IV contrast. COMPARISON:  Chest CT 08/05/2018. FINDINGS: Cardiovascular: Heart size is normal. There is no significant pericardial fluid, thickening or pericardial calcification. There is aortic atherosclerosis, as well as atherosclerosis of the great vessels of the mediastinum and the coronary arteries, including calcified  atherosclerotic plaque in the left main, left anterior descending, left circumflex and right coronary arteries. Mediastinum/Nodes: No pathologically enlarged mediastinal or hilar lymph nodes. Please note that accurate exclusion of hilar adenopathy is limited on noncontrast CT scans. Esophagus is unremarkable in appearance. No axillary lymphadenopathy. Lungs/Pleura: Several small pulmonary nodules are noted throughout the lungs bilaterally, largest of which is in the medial aspect of the superior segment of the left lower lobe (axial image 113 of series 3), with a volume derived mean diameter of 5.3 mm. No larger more suspicious appearing pulmonary nodules or masses are noted. No acute consolidative airspace disease. No pleural effusions. Mild diffuse bronchial wall thickening with mild centrilobular and paraseptal emphysema. Upper Abdomen: Calcified gallstone lying dependently in the gallbladder. Aortic atherosclerosis. Musculoskeletal: There are no aggressive appearing lytic or blastic lesions noted in the visualized portions of the skeleton. IMPRESSION: 1. Lung-RADS 2S, benign appearance or  behavior. Continue annual screening with low-dose chest CT without contrast in 12 months. 2. The "S" modifier above refers to potentially clinically significant non lung cancer related findings. Specifically, there is aortic atherosclerosis, in addition to left main and 3 vessel coronary artery disease. Please note that although the presence of coronary artery calcium documents the presence of coronary artery disease, the severity of this disease and any potential stenosis cannot be assessed on this non-gated CT examination. Assessment for potential risk factor modification, dietary therapy or pharmacologic therapy may be warranted, if clinically indicated. 3. Mild diffuse bronchial wall thickening with mild centrilobular and paraseptal emphysema; imaging findings suggestive of underlying COPD. 4. Cholelithiasis. Aortic Atherosclerosis (ICD10-I70.0) and Emphysema (ICD10-J43.9). Electronically Signed   By: Vinnie Langton M.D.   On: 08/09/2019 16:25    Assessment and Plan:   Johnny Williams is a 59 y.o. y/o male who comes in today with a history of possible esophageal varices and he has never had a screening colonoscopy.  The patient will be set up for an EGD and colonoscopy.  If the EGD does not show any sign of esophageal varices then I do not think that this patient needs any further work-up for esophageal varices.  He has been explained that due to his age he should undergo a screening colonoscopy and he has agreed to that.  The patient will follow-up at the time of the EGD and colonoscopy. I have discussed risks & benefits which include, but are not limited to, bleeding, infection, perforation & drug reaction.  The patient agrees with this plan & written consent will be obtained.       Lucilla Lame, MD. Marval Regal    Note: This dictation was prepared with Dragon dictation along with smaller phrase technology. Any transcriptional errors that result from this process are unintentional.

## 2019-09-02 ENCOUNTER — Other Ambulatory Visit
Admission: RE | Admit: 2019-09-02 | Discharge: 2019-09-02 | Disposition: A | Discharge status: Home / Self Care | Payer: Medicaid Other | Source: Ambulatory Visit | Attending: Gastroenterology | Admitting: Gastroenterology

## 2019-09-02 ENCOUNTER — Other Ambulatory Visit: Payer: Self-pay

## 2019-09-02 DIAGNOSIS — Z01812 Encounter for preprocedural laboratory examination: Secondary | ICD-10-CM | POA: Diagnosis not present

## 2019-09-02 DIAGNOSIS — Z20822 Contact with and (suspected) exposure to covid-19: Secondary | ICD-10-CM | POA: Diagnosis not present

## 2019-09-03 LAB — SARS CORONAVIRUS 2 (TAT 6-24 HRS): SARS Coronavirus 2: NEGATIVE

## 2019-09-06 ENCOUNTER — Ambulatory Visit: Payer: Medicaid Other | Admitting: Anesthesiology

## 2019-09-06 ENCOUNTER — Encounter
Admission: RE | Disposition: A | Discharge status: Home / Self Care | Payer: Self-pay | Source: Home / Self Care | Attending: Gastroenterology

## 2019-09-06 ENCOUNTER — Ambulatory Visit
Admission: RE | Admit: 2019-09-06 | Discharge: 2019-09-06 | Disposition: A | Discharge status: Home / Self Care | Payer: Medicaid Other | Attending: Gastroenterology | Admitting: Gastroenterology

## 2019-09-06 ENCOUNTER — Encounter: Payer: Self-pay | Admitting: Gastroenterology

## 2019-09-06 DIAGNOSIS — Z6838 Body mass index (BMI) 38.0-38.9, adult: Secondary | ICD-10-CM | POA: Insufficient documentation

## 2019-09-06 DIAGNOSIS — K319 Disease of stomach and duodenum, unspecified: Secondary | ICD-10-CM | POA: Diagnosis not present

## 2019-09-06 DIAGNOSIS — E785 Hyperlipidemia, unspecified: Secondary | ICD-10-CM | POA: Insufficient documentation

## 2019-09-06 DIAGNOSIS — Z79899 Other long term (current) drug therapy: Secondary | ICD-10-CM | POA: Diagnosis not present

## 2019-09-06 DIAGNOSIS — Z1211 Encounter for screening for malignant neoplasm of colon: Secondary | ICD-10-CM

## 2019-09-06 DIAGNOSIS — K219 Gastro-esophageal reflux disease without esophagitis: Secondary | ICD-10-CM | POA: Insufficient documentation

## 2019-09-06 DIAGNOSIS — I10 Essential (primary) hypertension: Secondary | ICD-10-CM | POA: Diagnosis not present

## 2019-09-06 DIAGNOSIS — G4730 Sleep apnea, unspecified: Secondary | ICD-10-CM | POA: Diagnosis not present

## 2019-09-06 DIAGNOSIS — K635 Polyp of colon: Secondary | ICD-10-CM | POA: Diagnosis not present

## 2019-09-06 DIAGNOSIS — K2100 Gastro-esophageal reflux disease with esophagitis, without bleeding: Secondary | ICD-10-CM | POA: Diagnosis present

## 2019-09-06 DIAGNOSIS — K228 Other specified diseases of esophagus: Secondary | ICD-10-CM | POA: Insufficient documentation

## 2019-09-06 DIAGNOSIS — K2980 Duodenitis without bleeding: Secondary | ICD-10-CM | POA: Diagnosis not present

## 2019-09-06 DIAGNOSIS — F17210 Nicotine dependence, cigarettes, uncomplicated: Secondary | ICD-10-CM | POA: Diagnosis not present

## 2019-09-06 DIAGNOSIS — D122 Benign neoplasm of ascending colon: Secondary | ICD-10-CM | POA: Insufficient documentation

## 2019-09-06 DIAGNOSIS — J449 Chronic obstructive pulmonary disease, unspecified: Secondary | ICD-10-CM | POA: Insufficient documentation

## 2019-09-06 DIAGNOSIS — D125 Benign neoplasm of sigmoid colon: Secondary | ICD-10-CM | POA: Diagnosis not present

## 2019-09-06 DIAGNOSIS — I8500 Esophageal varices without bleeding: Secondary | ICD-10-CM

## 2019-09-06 DIAGNOSIS — D123 Benign neoplasm of transverse colon: Secondary | ICD-10-CM | POA: Diagnosis not present

## 2019-09-06 DIAGNOSIS — Z96641 Presence of right artificial hip joint: Secondary | ICD-10-CM | POA: Insufficient documentation

## 2019-09-06 DIAGNOSIS — Z7982 Long term (current) use of aspirin: Secondary | ICD-10-CM | POA: Insufficient documentation

## 2019-09-06 DIAGNOSIS — Z791 Long term (current) use of non-steroidal anti-inflammatories (NSAID): Secondary | ICD-10-CM | POA: Diagnosis not present

## 2019-09-06 HISTORY — PX: COLONOSCOPY WITH PROPOFOL: SHX5780

## 2019-09-06 HISTORY — PX: ESOPHAGOGASTRODUODENOSCOPY (EGD) WITH PROPOFOL: SHX5813

## 2019-09-06 SURGERY — COLONOSCOPY WITH PROPOFOL
Anesthesia: General

## 2019-09-06 MED ORDER — GLYCOPYRROLATE 0.2 MG/ML IJ SOLN
INTRAMUSCULAR | Status: DC | PRN
  Administered 2019-09-06: .2 mg via INTRAVENOUS

## 2019-09-06 MED ORDER — LIDOCAINE HCL (PF) 2 % IJ SOLN
INTRAMUSCULAR | Status: AC
  Filled 2019-09-06: qty 10

## 2019-09-06 MED ORDER — LIDOCAINE HCL (CARDIAC) PF 100 MG/5ML IV SOSY
PREFILLED_SYRINGE | INTRAVENOUS | Status: DC | PRN
  Administered 2019-09-06: 100 mg via INTRAVENOUS

## 2019-09-06 MED ORDER — PROPOFOL 500 MG/50ML IV EMUL
INTRAVENOUS | Status: AC
  Filled 2019-09-06: qty 50

## 2019-09-06 MED ORDER — GLYCOPYRROLATE 0.2 MG/ML IJ SOLN
INTRAMUSCULAR | Status: AC
  Filled 2019-09-06: qty 1

## 2019-09-06 MED ORDER — PROPOFOL 500 MG/50ML IV EMUL
INTRAVENOUS | Status: DC | PRN
  Administered 2019-09-06: 150 ug/kg/min via INTRAVENOUS

## 2019-09-06 MED ORDER — SODIUM CHLORIDE 0.9 % IV SOLN
INTRAVENOUS | Status: DC
  Administered 2019-09-06: 1000 mL via INTRAVENOUS

## 2019-09-06 MED ORDER — PROPOFOL 500 MG/50ML IV EMUL
INTRAVENOUS | Status: DC | PRN
  Administered 2019-09-06: 20 mg via INTRAVENOUS
  Administered 2019-09-06: 50 mg via INTRAVENOUS

## 2019-09-06 NOTE — Anesthesia Preprocedure Evaluation (Addendum)
Anesthesia Evaluation  Patient identified by MRN, date of birth, ID band Patient awake    Reviewed: Allergy & Precautions, H&P , NPO status , Patient's Chart, lab work & pertinent test results, reviewed documented beta blocker date and time   History of Anesthesia Complications Negative for: history of anesthetic complications  Airway Mallampati: III  TM Distance: >3 FB Neck ROM: full    Dental  (+) Caps, Dental Advidsory Given, Poor Dentition, Partial Upper, Missing   Pulmonary shortness of breath and with exertion, sleep apnea , COPD,  COPD inhaler, neg recent URI, Current SmokerPatient did not abstain from smoking.,           Cardiovascular Exercise Tolerance: Good hypertension, Pt. on medications (-) angina(-) CAD, (-) Past MI, (-) Cardiac Stents and (-) CABG (-) dysrhythmias (-) Valvular Problems/Murmurs     Neuro/Psych  Headaches, neg Seizures PSYCHIATRIC DISORDERS Anxiety  Neuromuscular disease    GI/Hepatic Neg liver ROS, GERD  ,  Endo/Other  diabetesMorbid obesity  Renal/GU negative Renal ROS  negative genitourinary   Musculoskeletal   Abdominal   Peds  Hematology negative hematology ROS (+)   Anesthesia Other Findings Past Medical History: No date: Anxiety No date: Arthritis No date: Chronic insomnia No date: COPD (chronic obstructive pulmonary disease) (H[facility]) No date: Dyspnea No date: Esophageal stricture No date: Esophageal varices ([facility]) No date: GERD (gastroesophageal reflux disease) No date: Hyperlipidemia No date: Hypertension No date: Joint ache No date: Pre-diabetes No date: Reflux esophagitis No date: Sleep apnea   Reproductive/Obstetrics negative OB ROS                            Anesthesia Physical  Anesthesia Plan  ASA: III  Anesthesia Plan: General   Post-op Pain Management:    Induction: Intravenous  PONV Risk Score and Plan: 1 and Propofol infusion  and TIVA  Airway Management Planned: Nasal CPAP  Additional Equipment: None  Intra-op Plan:   Post-operative Plan:   Informed Consent: I have reviewed the patients History and Physical, chart, labs and discussed the procedure including the risks, benefits and alternatives for the proposed anesthesia with the patient or authorized representative who has indicated his/her understanding and acceptance.     Dental Advisory Given  Plan Discussed with: Anesthesiologist, CRNA and Surgeon  Anesthesia Plan Comments: (Discussed risks of anesthesia with patient, including possibility of difficulty with spontaneous ventilation under anesthesia necessitating airway intervention, PONV, and rare risks such as cardiac or respiratory or neurological events. Patient understands. Patient informed about increased incidence of above perioperative risk due to high BMI. Patient understands. )       Anesthesia Quick Evaluation

## 2019-09-06 NOTE — Op Note (Signed)
[facility] Gastroenterology Patient Name: Johnny Williams Procedure Date: 09/06/2019 8:34 AM MRN: [id number] Account #: [id number] Date of Birth: 1960/07/11 Admit Type: Outpatient Age: 59 Room: [facility] ENDO ROOM 4 Gender: Male Note Status: Finalized Procedure:             Upper GI endoscopy Indications:           Suspected gastro-esophageal reflux disease Providers:             Lucilla Lame MD, MD Referring MD:          No Local Md, MD (Referring MD) Medicines:             Propofol per Anesthesia Complications:         No immediate complications. Procedure:             Pre-Anesthesia Assessment:                        - Prior to the procedure, a History and Physical was                         performed, and patient medications and allergies were                         reviewed. The patient's tolerance of previous                         anesthesia was also reviewed. The risks and benefits                         of the procedure and the sedation options and risks                         were discussed with the patient. All questions were                         answered, and informed consent was obtained. Prior                         Anticoagulants: The patient has taken no previous                         anticoagulant or antiplatelet agents. ASA Grade                         Assessment: III - A patient with severe systemic                         disease. After reviewing the risks and benefits, the                         patient was deemed in satisfactory condition to                         undergo the procedure.                        After obtaining informed consent, the endoscope was  passed under direct vision. Throughout the procedure,                         the patient's blood pressure, pulse, and oxygen                         saturations were monitored continuously. The Endoscope                         was introduced through the  mouth, and advanced to the                         second part of duodenum. The upper GI endoscopy was                         accomplished without difficulty. The patient tolerated                         the procedure well. 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 Biopsies were taken       with a cold forceps for histology.      Localized mild inflammation characterized by erythema was found in the       gastric antrum. Biopsies were taken with a cold forceps for histology.      Moderate inflammation was found in the duodenal bulb. 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 Biopsied.                        - Gastritis. Biopsied.                        - Duodenitis. Recommendation:        - Discharge patient to home.                        - Resume previous diet.                        - Continue present medications.                        - Perform a colonoscopy today. Procedure Code(s):     --- Professional ---                        [phone], Esophagogastroduodenoscopy, flexible,                         transoral; with biopsy, single or multiple Diagnosis Code(s):     --- Professional ---                        K29.70, Gastritis, unspecified, without bleeding                        K29.80, Duodenitis without bleeding CPT  copyright 2019 American Medical Association. All rights reserved. The codes documented in this report are preliminary and upon coder review may  be revised to meet current compliance requirements. Lucilla Lame MD, MD 09/06/2019 8:45:34 AM This report has been signed electronically. Number of Addenda: 0 Note Initiated On: 09/06/2019 8:34 AM Estimated Blood Loss:  Estimated blood loss: none.      [facility]

## 2019-09-06 NOTE — Anesthesia Postprocedure Evaluation (Signed)
Anesthesia Post Note  Patient: Johnny Williams  Procedure(s) Performed: COLONOSCOPY WITH PROPOFOL (N/A ) ESOPHAGOGASTRODUODENOSCOPY (EGD) WITH PROPOFOL (N/A )  Patient location during evaluation: Endoscopy Anesthesia Type: General Level of consciousness: awake and alert Pain management: pain level controlled Vital Signs Assessment: post-procedure vital signs reviewed and stable Respiratory status: spontaneous breathing, nonlabored ventilation, respiratory function stable and patient connected to nasal cannula oxygen Cardiovascular status: blood pressure returned to baseline and stable Postop Assessment: no apparent nausea or vomiting Anesthetic complications: no     Last Vitals:  Vitals:   09/06/19 0918 09/06/19 0920  BP: 132/84 132/84  Pulse: 83 84  Resp: 20 (!) 21  Temp: (!) 36.1 C   SpO2: 98% 97%    Last Pain:  Vitals:   09/06/19 0918  TempSrc: Temporal  PainSc:                  Arita Miss

## 2019-09-06 NOTE — Op Note (Signed)
[facility] Gastroenterology Patient Name: Johnny Williams Procedure Date: 09/06/2019 8:30 AM MRN: [id number] Account #: [id number] Date of Birth: 09-04-1960 Admit Type: Outpatient Age: 59 Room: [facility] ENDO ROOM 4 Gender: Male Note Status: Finalized Procedure:             Colonoscopy Indications:           Screening for colorectal malignant neoplasm Providers:             Lucilla Lame MD, MD Referring MD:          No Local Md, MD (Referring MD) Medicines:             Propofol per Anesthesia Complications:         No immediate complications. Procedure:             Pre-Anesthesia Assessment:                        - Prior to the procedure, a History and Physical was                         performed, and patient medications and allergies were                         reviewed. The patient's tolerance of previous                         anesthesia was also reviewed. The risks and benefits                         of the procedure and the sedation options and risks                         were discussed with the patient. All questions were                         answered, and informed consent was obtained. Prior                         Anticoagulants: The patient has taken no previous                         anticoagulant or antiplatelet agents. ASA Grade                         Assessment: III - A patient with severe systemic                         disease. After reviewing the risks and benefits, the                         patient was deemed in satisfactory condition to                         undergo the procedure.                        After obtaining informed consent, the colonoscope was  passed under direct vision. Throughout the procedure,                         the patient's blood pressure, pulse, and oxygen                         saturations were monitored continuously. The                         Colonoscope was introduced through the anus  and                         advanced to the the cecum, identified by appendiceal                         orifice and ileocecal valve. The colonoscopy was                         performed without difficulty. The patient tolerated                         the procedure well. The quality of the bowel                         preparation was excellent. Findings:      The perianal and digital rectal examinations were normal.      A 7 mm polyp was found in the ascending colon. The polyp was sessile.       The polyp was removed with a cold snare. Resection and retrieval were       complete.      A 6 mm polyp was found in the transverse colon. The polyp was sessile.       The polyp was removed with a cold snare. Resection and retrieval were       complete.      Six sessile polyps were found in the sigmoid colon. The polyps were 7 to       8 mm in size. These polyps were removed with a cold snare. Resection and       retrieval were complete.      Two sessile polyps were found in the sigmoid colon. The polyps were 3 to       4 mm in size. These polyps were removed with a cold biopsy forceps.       Resection and retrieval were complete.      Multiple small-mouthed diverticula were found in the sigmoid colon.      Non-bleeding internal hemorrhoids were found during retroflexion. The       hemorrhoids were Grade II (internal hemorrhoids that prolapse but reduce       spontaneously). Impression:            - One 7 mm polyp in the ascending colon, removed with                         a cold snare. Resected and retrieved.                        - One 6 mm polyp in the transverse colon, removed with  a cold snare. Resected and retrieved.                        - Six 7 to 8 mm polyps in the sigmoid colon, removed                         with a cold snare. Resected and retrieved.                        - Two 3 to 4 mm polyps in the sigmoid colon, removed                         with  a cold biopsy forceps. Resected and retrieved.                        - Diverticulosis in the sigmoid colon.                        - Non-bleeding internal hemorrhoids. Recommendation:        - Discharge patient to home.                        - Resume previous diet.                        - Continue present medications.                        - Await pathology results.                        - Repeat colonoscopy in 1 years if polyp adenoma and                         10 years if hyperplastic Procedure Code(s):     --- Professional ---                        [phone], Colonoscopy, flexible; with removal of                         tumor(s), polyp(s), or other lesion(s) by snare                         technique                        [id number], 2, Colonoscopy, flexible; with biopsy, single                         or multiple Diagnosis Code(s):     --- Professional ---                        Z12.11, Encounter for screening for malignant neoplasm                         of colon                        K63.5, Polyp of colon CPT copyright 2019 American Medical Association. All  rights reserved. The codes documented in this report are preliminary and upon coder review may  be revised to meet current compliance requirements. Lucilla Lame MD, MD 09/06/2019 9:16:24 AM This report has been signed electronically. Number of Addenda: 0 Note Initiated On: 09/06/2019 8:30 AM Scope Withdrawal Time: 0 hours 15 minutes 50 seconds  Total Procedure Duration: 0 hours 23 minutes 8 seconds  Estimated Blood Loss:  Estimated blood loss: none.      [facility]

## 2019-09-06 NOTE — Transfer of Care (Signed)
Immediate Anesthesia Transfer of Care Note  Patient: Johnny Williams  Procedure(s) Performed: COLONOSCOPY WITH PROPOFOL (N/A ) ESOPHAGOGASTRODUODENOSCOPY (EGD) WITH PROPOFOL (N/A )  Patient Location: PACU  Anesthesia Type:General  Level of Consciousness: drowsy  Airway & Oxygen Therapy: Patient Spontanous Breathing and Patient connected to face mask oxygen  Post-op Assessment: Report given to RN and Post -op Vital signs reviewed and stable  Post vital signs: Reviewed and stable  Last Vitals:  Vitals Value Taken Time  BP 132/84 09/06/19 0920  Temp 36.1 C 09/06/19 0918  Pulse 84 09/06/19 0920  Resp 21 09/06/19 0920  SpO2 97 % 09/06/19 0920  Vitals shown include unvalidated device data.  Last Pain:  Vitals:   09/06/19 0918  TempSrc: Temporal  PainSc:          Complications: No apparent anesthesia complications

## 2019-09-06 NOTE — H&P (Signed)
Lucilla Lame, MD [facility] [address] [place], [place] [address] Phone:[phone] Fax : [phone]  Primary Care Physician:  Ulyess Blossom, [place] Primary Gastroenterologist:  Dr. Allen Norris  Pre-Procedure History & Physical: HPI:  Johnny Williams is a 59 y.o. male is here for an endoscopy and colonoscopy.   Past Medical History:  Diagnosis Date  . Anxiety   . Arthritis   . Chronic insomnia   . COPD (chronic obstructive pulmonary disease) ([place])   . Dyspnea   . Esophageal stricture   . Esophageal varices ([place])   . GERD (gastroesophageal reflux disease)   . Hyperlipidemia   . Hypertension   . Joint ache   . Pre-diabetes   . Reflux esophagitis   . Sleep apnea     Past Surgical History:  Procedure Laterality Date  . JOINT REPLACEMENT Right 2019  . 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 Authorizing Provider  acetaminophen (TYLENOL) 650 MG CR tablet Take 1,300 mg by mouth 3 (three) times daily.    Yes [provider]  Ascorbic Acid (VITAMIN C) 1000 MG tablet Take 1,000 mg by mouth 3 (three) times daily.   Yes [provider]  aspirin 81 MG EC tablet Take by mouth.   Yes [provider]  Aspirin-Salicylamide-Caffeine (ARTHRITIS STRENGTH BC POWDER PO) Take 1 packet by mouth every 4 (four) hours.    Yes [provider]  atorvastatin (LIPITOR) 40 MG tablet Take 40 mg by mouth daily.   Yes [provider]  calcium carbonate (TUMS - DOSED IN MG ELEMENTAL CALCIUM) 500 MG chewable tablet Chew 2 tablets by mouth daily.   Yes [provider]  CALCIUM-MAGNESIUM-ZINC PO Take 1 tablet by mouth daily.   Yes [provider]  celecoxib (CELEBREX) 200 MG capsule Take 1 capsule (200 mg total) by mouth 2 (two) times daily.  05/20/18  Yes Reche Dixon, PA-C  Cholecalciferol (VITAMIN D3) 125 MCG (5000 UT) CAPS Take 1 capsule by mouth daily.   Yes [provider]  diclofenac sodium (VOLTAREN) 1 % GEL Apply 2 g topically 4 (four) times daily.    Yes [provider]  hydrochlorothiazide (HYDRODIURIL) 25 MG tablet Take 25 mg by mouth daily.   Yes [provider]  Liniments (ABSORBINE ARTHRITIS STRENGTH EX) Apply topically.   Yes [provider]  Menthol, Topical Analgesic, (ABSORBINE PAIN RELIEVING EX) Apply 1 application topically as needed.   Yes [provider]  Menthol, Topical Analgesic, (ICY HOT EX) Apply 1 application topically as needed.   Yes [provider]  Multiple Vitamins-Minerals (ONE-A-DAY 50 PLUS PO) Take by mouth.   Yes [provider]  omeprazole (PRILOSEC) 20 MG capsule Take 20 mg by mouth daily.   Yes [provider]  verapamil (VERELAN PM) 240 MG 24 hr capsule Take 240 mg by mouth daily.    Yes [provider]  vitamin B-12 (CYANOCOBALAMIN) 1000 MCG tablet Take 2,000 mcg by mouth daily.   Yes [provider]    Allergies as of 08/15/2019 - Review Complete 08/15/2019  Allergen Reaction Noted  . Fluoxetine  11/07/2013    Family History  Problem Relation Age of Onset  . Hypertension Mother   . Heart disease Mother     Social History  Socioeconomic History  . Marital status: Single    Spouse name: Not on file  . Number of children: Not on file  . Years of education: Not on file  . Highest education level: Not on file  Occupational History  . Not on file  Tobacco Use  . Smoking status: Current Every Day Smoker    Packs/day: 2.00    Years: 40.00    Pack years: 80.00  . Smokeless tobacco: Never Used  . Tobacco comment: past history of 4 day  Substance and Sexual Activity  . Alcohol use: Not Currently  . Drug use: Never    Comment: Hemp oil for pain  . Sexual activity: Not on file  Other Topics  Concern  . Not on file  Social History Narrative  . Not on file   Social Determinants of Health   Financial Resource Strain:   . Difficulty of Paying Living Expenses:   Food Insecurity:   . Worried About Charity fundraiser in the Last Year:   . Arboriculturist in the Last Year:   Transportation Needs:   . Film/video editor (Medical):   Marland Kitchen Lack of Transportation (Non-Medical):   Physical Activity:   . Days of Exercise per Week:   . Minutes of Exercise per Session:   Stress:   . Feeling of Stress :   Social Connections:   . Frequency of Communication with Friends and Family:   . Frequency of Social Gatherings with Friends and Family:   . Attends Religious Services:   . Active Member of Clubs or Organizations:   . Attends Archivist Meetings:   Marland Kitchen Marital Status:   Intimate Partner Violence:   . Fear of Current or Ex-Partner:   . Emotionally Abused:   Marland Kitchen Physically Abused:   . Sexually Abused:     Review of Systems: See HPI, otherwise negative ROS  Physical Exam: BP 130/86   Pulse 86   Temp (!) 97 F (36.1 C) (Temporal)   Resp 20   Ht 6\' 1"  (1.854 m)   Wt 131.8 kg   SpO2 98%   BMI 38.34 kg/m  General:   Alert,  pleasant and cooperative in NAD Head:  Normocephalic and atraumatic. Neck:  Supple; no masses or thyromegaly. Lungs:  Clear throughout to auscultation.    Heart:  Regular rate and rhythm. Abdomen:  Soft, nontender and nondistended. Normal bowel sounds, without guarding, and without rebound.   Neurologic:  Alert and  oriented x4;  grossly normal neurologically.  Impression/Plan: Johnny Williams is here for an endoscopy and colonoscopy to be performed for screening and questionable varices.  Risks, benefits, limitations, and alternatives regarding  endoscopy and colonoscopy have been reviewed with the patient.  Questions have been answered.  All parties agreeable.   Lucilla Lame, MD  09/06/2019, 8:30 AM

## 2019-09-07 ENCOUNTER — Encounter: Payer: Self-pay | Admitting: *Deleted

## 2019-09-08 LAB — SURGICAL PATHOLOGY

## 2019-09-19 ENCOUNTER — Telehealth: Payer: Self-pay

## 2019-09-19 NOTE — Telephone Encounter (Signed)
Left vm for pt to return my call to schedule follow up to discuss procedure results.

## 2019-09-19 NOTE — Telephone Encounter (Signed)
Ginger-patient returned your call this afternoon.  I told him I would ask you to call him back this afternoon after clinic.  Thanks,  [place], [place]

## 2019-09-19 NOTE — Telephone Encounter (Signed)
-----   Message from Lucilla Lame, MD sent at 09/14/2019 10:14 AM EDT ----- Please have the patient come in for a follow up.

## 2019-10-03 ENCOUNTER — Ambulatory Visit (INDEPENDENT_AMBULATORY_CARE_PROVIDER_SITE_OTHER): Payer: Medicaid Other | Admitting: Gastroenterology

## 2019-10-03 ENCOUNTER — Encounter: Payer: Self-pay | Admitting: Gastroenterology

## 2019-10-03 ENCOUNTER — Other Ambulatory Visit: Payer: Self-pay

## 2019-10-03 VITALS — BP 151/76 | HR 82 | Temp 98.5°F | Ht 72.0 in | Wt 294.4 lb

## 2019-10-03 DIAGNOSIS — R1310 Dysphagia, unspecified: Secondary | ICD-10-CM | POA: Diagnosis not present

## 2019-10-03 DIAGNOSIS — R1319 Other dysphagia: Secondary | ICD-10-CM

## 2019-10-03 NOTE — Progress Notes (Signed)
Primary Care Physician: Ulyess Blossom, PA  Primary Gastroenterologist:  Dr. Lucilla Lame  Chief Complaint  Patient presents with  . Follow up procedure results    HPI: Johnny Williams is a 59 y.o. male here for follow-up after an EGD and colonoscopy.  The patient was having EGD for dysphagia.  The patient had multiple adenomatous polyps on his colonoscopy.  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  He reports that he continues to have some dysphagia with the food never actually gets stuck he is usually able to force it down to bring it up.  There is no report of any unexplained weight loss.  The patient does report that he is going to [facility] to have hip surgery.  Past Medical History:  Diagnosis Date  . Anxiety   . Arthritis   . Chronic insomnia   . COPD (chronic obstructive pulmonary disease) ([place])   . Dyspnea   . Esophageal stricture   . Esophageal varices ([place])   . GERD (gastroesophageal reflux disease)   . Hyperlipidemia   . Hypertension   . Joint ache   . Pre-diabetes   . Reflux esophagitis   . Sleep apnea     Current Outpatient Medications  Medication Sig Dispense Refill  . acetaminophen (TYLENOL) 650 MG CR tablet Take 1,300 mg by mouth 3 (three) times daily.     . Ascorbic Acid (VITAMIN C) 1000 MG tablet Take 1,000 mg by mouth 3 (three) times daily.    Marland Kitchen aspirin 81 MG EC tablet Take by mouth.    . Aspirin-Salicylamide-Caffeine (ARTHRITIS STRENGTH BC POWDER PO) Take 1 packet by mouth every 4 (four) hours.     Marland Kitchen atorvastatin (LIPITOR) 40 MG tablet Take 40 mg by mouth daily.    . calcium carbonate (TUMS - DOSED IN MG ELEMENTAL CALCIUM) 500 MG chewable tablet Chew 2 tablets by mouth daily.    Marland Kitchen CALCIUM-MAGNESIUM-ZINC PO Take 1 tablet by mouth daily.    . celecoxib (CELEBREX) 200 MG capsule Take 1 capsule (200 mg total) by mouth 2 (two) times daily. 60 capsule 0  . Cholecalciferol (VITAMIN D3) 125 MCG  (5000 UT) CAPS Take 1 capsule by mouth daily.    . diclofenac sodium (VOLTAREN) 1 % GEL Apply 2 g topically 4 (four) times daily.     . hydrochlorothiazide (HYDRODIURIL) 25 MG tablet Take 25 mg by mouth daily.    . Liniments (ABSORBINE ARTHRITIS STRENGTH EX) Apply topically.    . Menthol, Topical Analgesic, (ABSORBINE PAIN RELIEVING EX) Apply 1 application topically as needed.    . Menthol, Topical Analgesic, (ICY HOT EX) Apply 1 application topically as needed.    . Multiple Vitamins-Minerals (ONE-A-DAY 50 PLUS PO) Take by mouth.    Marland Kitchen omeprazole (PRILOSEC) 20 MG capsule Take 20 mg by mouth daily.    . verapamil (VERELAN PM) 240 MG 24 hr capsule Take 240 mg by mouth daily.     . vitamin B-12 (CYANOCOBALAMIN) 1000 MCG tablet Take 2,000 mcg by mouth daily.     No current facility-administered medications for this visit.    Allergies as of 10/03/2019 - Review Complete 09/06/2019  Allergen Reaction Noted  . Fluoxetine  11/07/2013    ROS:  General: Negative for anorexia, weight loss, fever, chills, fatigue, weakness. ENT: Negative for hoarseness, difficulty swallowing , nasal congestion. CV: Negative for chest pain, angina, palpitations, dyspnea on exertion, peripheral edema.  Respiratory: Negative for  dyspnea at rest, dyspnea on exertion, cough, sputum, wheezing.  GI: See history of present illness. GU:  Negative for dysuria, hematuria, urinary incontinence, urinary frequency, nocturnal urination.  Endo: Negative for unusual weight change.    Physical Examination:   BP (!) 151/76   Pulse 82   Temp 98.5 F (36.9 C) (Temporal)   Ht 6' (1.829 m)   Wt 294 lb 6.4 oz (133.5 kg)   BMI 39.93 kg/m   General: Well-nourished, well-developed in no acute distress.  Eyes: No icterus. Conjunctivae pink. Lungs: Clear to auscultation bilaterally. Non-labored. Extremities: No lower extremity edema. No clubbing or deformities. Neuro: Alert and oriented x 3.  Grossly intact. Skin: Warm and dry,  no jaundice.   Psych: Alert and cooperative, normal mood and affect.  Labs:    Imaging Studies: No results found.  Assessment and Plan:   Johnny Williams is a 59 y.o. y/o male y/o male who comes in today for follow-up of his EGD and colonoscopy.  The patient had multiple polyps and will need a repeat colonoscopy in 5 years.  The patient's esophagus showed abnormalities that were biopsied and was consistent with esophageal papillomatosis.  The patient has been told that this has been reported as a precancerous condition and he should have a second opinion at [facility] on whether surgery is warranted versus close follow-up.  He has also been told that they may want to repeat the upper endoscopy for more pathology or may want to review the pathology already taken.  The patient has been explained the plan and agrees with it he will be set up for a referral to [facility].     Lucilla Lame, MD. Marval Regal    Note: This dictation was prepared with Dragon dictation along with smaller phrase technology. Any transcriptional errors that result from this process are unintentional.

## 2019-10-20 ENCOUNTER — Ambulatory Visit: Payer: Medicaid Other | Attending: Internal Medicine

## 2019-10-20 DIAGNOSIS — Z20822 Contact with and (suspected) exposure to covid-19: Secondary | ICD-10-CM

## 2019-10-21 ENCOUNTER — Telehealth: Payer: Self-pay | Admitting: *Deleted

## 2019-10-21 LAB — NOVEL CORONAVIRUS, NAA: SARS-CoV-2, NAA: NOT DETECTED

## 2019-10-21 LAB — SARS-COV-2, NAA 2 DAY TAT

## 2019-10-21 NOTE — Telephone Encounter (Signed)
Pt notified of negative COVID-19 results. Understanding verbalized.   

## 2019-11-06 NOTE — Discharge Instructions (Signed)
Instructions after Total Hip Replacement     Johnny Williams, Jr., M.D.     Dept. of Orthopaedics & Sports Medicine  [facility]  [address]  [place], [place]  [address]  Phone: [phone]   Fax: [phone]    DIET: . Drink plenty of non-alcoholic fluids. . Resume your normal diet. Include foods high in fiber.  ACTIVITY:  . You may use crutches or a walker with weight-bearing as tolerated, unless instructed otherwise. . You may be weaned off of the walker or crutches by your Physical Therapist.  . Do NOT reach below the level of your knees or cross your legs until allowed.    . Continue doing gentle exercises. Exercising will reduce the pain and swelling, increase motion, and prevent muscle weakness.   . Please continue to use the TED compression stockings for 6 weeks. You may remove the stockings at night, but should reapply them in the morning. . Do not drive or operate any equipment until instructed.  WOUND CARE:  . Continue to use ice packs periodically to reduce pain and swelling. . Keep the incision clean and dry. . You may bathe or shower after the staples are removed at the first office visit following surgery.  MEDICATIONS: . You may resume your regular medications. . Please take the pain medication as prescribed on the medication. . Do not take pain medication on an empty stomach. . You have been given a prescription for a blood thinner to prevent blood clots. Please take the medication as instructed. (NOTE: After completing a 2 week course of Lovenox, take one Enteric-coated aspirin once a day.) . Pain medications and iron supplements can cause constipation. Use a stool softener (Senokot or Colace) on a daily basis and a laxative (dulcolax or miralax) as needed. . Do not drive or drink alcoholic beverages when taking pain medications.  CALL THE OFFICE FOR: . Temperature above 101 degrees . Excessive bleeding or drainage on the dressing. . Excessive  swelling, coldness, or paleness of the toes. . Persistent nausea and vomiting.  FOLLOW-UP:  . You should have an appointment to return to the office in 6 weeks after surgery. . Arrangements have been made for continuation of Physical Therapy (either home therapy or outpatient therapy).     [facility] Department Directory         [email or web address]       [email or web address]          Cardiology  Appointments: [place] - [phone] [place] - [phone]  Endocrinology  Appointments: [place] - [phone] [place] - [phone]  Gastroenterology  Appointments: [place] - [phone] [place] - [phone]        General Surgery   Appointments: [place] - [phone]  Internal Medicine/Family Medicine  Appointments: [place] - [phone] [place] - [phone] [place] - [phone]  Metabolic and Weigh Loss Surgery  Appointments: [place] - [phone]        Neurology  Appointments: [place] - [phone] [place] - [phone]  Neurosurgery  Appointments: [place] - [phone]  Obstetrics & Gynecology  Appointments: [place] - [phone] [place] - [phone]        Pediatrics  Appointments: [place] - [phone] [place] - [phone]  Physiatry  Appointments: [place] -[phone]  Physical Therapy  Appointments: [place] - [phone] [place] - [phone]        Podiatry  Appointments: [place] - [phone] [place] - [phone]  Pulmonology  Appointments: [place] - [phone]  Rheumatology  Appointments: [place] - [phone]        [place] Location: [facility]  [address] [place], [place]  [address]  [place] Location: [facility] [address] [place], [place]  [address]  [place] Location: [facility] [address] [place], [place]  [address]    

## 2019-11-07 ENCOUNTER — Other Ambulatory Visit: Payer: Self-pay

## 2019-11-07 ENCOUNTER — Encounter
Admission: RE | Admit: 2019-11-07 | Discharge: 2019-11-07 | Disposition: A | Discharge status: Home / Self Care | Payer: Medicaid Other | Source: Ambulatory Visit | Attending: Orthopedic Surgery | Admitting: Orthopedic Surgery

## 2019-11-07 DIAGNOSIS — Z01812 Encounter for preprocedural laboratory examination: Secondary | ICD-10-CM | POA: Insufficient documentation

## 2019-11-07 DIAGNOSIS — Z20822 Contact with and (suspected) exposure to covid-19: Secondary | ICD-10-CM | POA: Insufficient documentation

## 2019-11-07 NOTE — Patient Instructions (Addendum)
Your EKG visit is scheduled on: Friday 11/11/19 @ 8:00 am. Check in at [facility] Your COVID test is scheduled on: Monday 11/14/19. Drive up in front of [facility] 8:00 am to 1:00 pm.  Your procedure is scheduled on: Wednesday 11/16/19 Report to Same Day Surgery [facility] Entrance, take elevator to 2nd floor.  Check in with Surgery Information.) To find out your arrival time, call [phone] 1:00-3:00 PM on Tuesday 11/15/19  Remember: Instructions that are not followed completely may result in serious medical risk, up to and including death, or upon the discretion of your surgeon and anesthesiologist your surgery may need to be rescheduled.   __x__ 1. Do not eat food (including mints, candies, chewing gum) after midnight the night before your procedure. You may drink clear liquids up to 2 hours before you are scheduled to arrive at the hospital for your procedure.  Do not drink anything within 2 hours of your scheduled arrival to the hospital.  Approved clear liquids:  --Water or Apple juice without pulp  --Clear carbohydrate beverage such as Gatorade or Powerade  --Black Coffee or Clear Tea (No milk, no creamers, do not add anything to the coffee or tea)   __x__ 2. Finish your (enclosed) Ensure pre-surgery drink 2 hours before your arrival time on the day of surgery.  __x__ 3. No alcohol or smoking for 24 hours before or after surgery.  __x__ 4. Notify your doctor if there is any change in your medical condition (cold, fever, infections).  __x__ 5. On the morning of surgery brush your teeth with toothpaste and water.  You may rinse your mouth with mouthwash if you wish.  Do not swallow any toothpaste or mouthwash.  Please read over the attached fact sheets: [facility] Preparing for Surgery, MRSA Information, Guide to Total Joint Replacement  __x__ Use (enclosed) CHG Soap as directed on instruction sheet.   Do not wear jewelry, lotions, powders, deodorant or  perfumes on the day of surgery.  Do not shave below the face/neck 48 hours prior to surgery.   Do not bring valuables to the hospital.  [facility] is not responsible for any belongings or valuables.               Dentures or bridgework may not be worn into surgery.  For patients admitted to the hospital, discharge time is determined by your treatment team.  For patients discharged on the day of surgery, you will NOT be permitted to drive yourself home.  You must have a responsible adult with you for 24 hours after surgery.  __x__ Take only these medicines on the morning of surgery with a SMALL SIP OF WATER:  1. Omeprazole (Prilosec)  2. Verapamil (Verelan)  _n/a_ Follow recommendations from Cardiologist, Pulmonologist or PCP regarding stopping blood thinners Coumadin, Plavix, Eliquis, Effient, Pradaxa, and Pletal.  __x__ STARTING 11/09/19: Do not take any Anti-inflammatories such as BC/Goody powders, aspirin products, Advil, Ibuprofen, Motrin, Aleve, Naproxen, Naprosyn. You may take Tylenol if needed.   __x__ STARTING 11/09/19: Do not take any over the counter supplements (Vitamin C) until after surgery. You may continue to take your Magnesium, Vitamin D, Vitamin B, and your multivitamin.

## 2019-11-11 ENCOUNTER — Other Ambulatory Visit: Payer: Self-pay

## 2019-11-11 ENCOUNTER — Encounter
Admission: RE | Admit: 2019-11-11 | Discharge: 2019-11-11 | Disposition: A | Discharge status: Home / Self Care | Payer: Medicaid Other | Source: Ambulatory Visit | Attending: Orthopedic Surgery | Admitting: Orthopedic Surgery

## 2019-11-11 DIAGNOSIS — Z01818 Encounter for other preprocedural examination: Secondary | ICD-10-CM | POA: Diagnosis present

## 2019-11-11 LAB — CBC WITH DIFFERENTIAL/PLATELET
Abs Immature Granulocytes: 0.04 10*3/uL (ref 0.00–0.07)
Basophils Absolute: 0.1 10*3/uL (ref 0.0–0.1)
Basophils Relative: 1 %
Eosinophils Absolute: 0.2 10*3/uL (ref 0.0–0.5)
Eosinophils Relative: 2 %
HCT: 51.6 % (ref 39.0–52.0)
Hemoglobin: 17 g/dL (ref 13.0–17.0)
Immature Granulocytes: 0 %
Lymphocytes Relative: 31 %
Lymphs Abs: 3.1 10*3/uL (ref 0.7–4.0)
MCH: 28.6 pg (ref 26.0–34.0)
MCHC: 32.9 g/dL (ref 30.0–36.0)
MCV: 86.7 fL (ref 80.0–100.0)
Monocytes Absolute: 0.9 10*3/uL (ref 0.1–1.0)
Monocytes Relative: 9 %
Neutro Abs: 5.5 10*3/uL (ref 1.7–7.7)
Neutrophils Relative %: 57 %
Platelets: 159 10*3/uL (ref 150–400)
RBC: 5.95 MIL/uL — ABNORMAL HIGH (ref 4.22–5.81)
RDW: 13.6 % (ref 11.5–15.5)
WBC: 9.8 10*3/uL (ref 4.0–10.5)
nRBC: 0 % (ref 0.0–0.2)

## 2019-11-11 LAB — URINALYSIS, ROUTINE W REFLEX MICROSCOPIC
Bacteria, UA: NONE SEEN
Bilirubin Urine: NEGATIVE
Glucose, UA: NEGATIVE mg/dL
Ketones, ur: NEGATIVE mg/dL
Leukocytes,Ua: NEGATIVE
Nitrite: NEGATIVE
Protein, ur: 100 mg/dL — AB
Specific Gravity, Urine: 1.023 (ref 1.005–1.030)
pH: 5 (ref 5.0–8.0)

## 2019-11-11 LAB — HEMOGLOBIN A1C
Hgb A1c MFr Bld: 6.8 % — ABNORMAL HIGH (ref 4.8–5.6)
Mean Plasma Glucose: 148.46 mg/dL

## 2019-11-11 LAB — COMPREHENSIVE METABOLIC PANEL
ALT: 18 U/L (ref 0–44)
AST: 17 U/L (ref 15–41)
Albumin: 4.2 g/dL (ref 3.5–5.0)
Alkaline Phosphatase: 62 U/L (ref 38–126)
Anion gap: 11 (ref 5–15)
BUN: 20 mg/dL (ref 6–20)
CO2: 28 mmol/L (ref 22–32)
Calcium: 9.1 mg/dL (ref 8.9–10.3)
Chloride: 105 mmol/L (ref 98–111)
Creatinine, Ser: 0.61 mg/dL (ref 0.61–1.24)
GFR calc Af Amer: >60 mL/min (ref >60)
GFR calc non Af Amer: >60 mL/min (ref >60)
Glucose, Bld: 72 mg/dL (ref 70–99)
Potassium: 3.7 mmol/L (ref 3.5–5.1)
Sodium: 144 mmol/L (ref 135–145)
Total Bilirubin: 0.8 mg/dL (ref 0.3–1.2)
Total Protein: 7 g/dL (ref 6.5–8.1)

## 2019-11-11 LAB — SURGICAL PCR SCREEN
MRSA, PCR: NEGATIVE
Staphylococcus aureus: NEGATIVE

## 2019-11-11 LAB — TYPE AND SCREEN
ABO/RH(D): B POS
Antibody Screen: NEGATIVE

## 2019-11-11 LAB — APTT: aPTT: 27 seconds (ref 24–36)

## 2019-11-11 LAB — PROTIME-INR
INR: 0.9 (ref 0.8–1.2)
Prothrombin Time: 12.1 seconds (ref 11.4–15.2)

## 2019-11-11 LAB — C-REACTIVE PROTEIN: CRP: 1.1 mg/dL — ABNORMAL HIGH (ref <1.0)

## 2019-11-11 LAB — SEDIMENTATION RATE: Sed Rate: 2 mm/hr (ref 0–20)

## 2019-11-12 LAB — URINE CULTURE
Culture: NO GROWTH
Special Requests: NORMAL

## 2019-11-14 ENCOUNTER — Other Ambulatory Visit: Payer: Self-pay

## 2019-11-14 ENCOUNTER — Other Ambulatory Visit
Admission: RE | Admit: 2019-11-14 | Discharge: 2019-11-14 | Disposition: A | Discharge status: Home / Self Care | Payer: Medicaid Other | Source: Ambulatory Visit | Attending: Orthopedic Surgery | Admitting: Orthopedic Surgery

## 2019-11-14 DIAGNOSIS — Z20822 Contact with and (suspected) exposure to covid-19: Secondary | ICD-10-CM | POA: Diagnosis not present

## 2019-11-14 DIAGNOSIS — Z01812 Encounter for preprocedural laboratory examination: Secondary | ICD-10-CM | POA: Diagnosis present

## 2019-11-14 LAB — SARS CORONAVIRUS 2 (TAT 6-24 HRS): SARS Coronavirus 2: NEGATIVE

## 2019-11-15 ENCOUNTER — Encounter: Payer: Self-pay | Admitting: Orthopedic Surgery

## 2019-11-15 NOTE — H&P (Signed)
ORTHOPAEDIC HISTORY & PHYSICAL Progress Notes Gwenlyn Fudge, [place] - 11/11/2019 9:15 AM EDT [facility] Chief Complaint:   Chief Complaint  Patient presents with  . Hip Pain  H & P LEFT HIP   History of Present Illness:   Johnny Williams is a 59 y.o. male that presents to clinic today for his preoperative history and evaluation. Patient presents unaccompanied. The patient is scheduled to undergo a left total hip arthroplasty on 11/16/19 by Dr. Marry Guan. Patient has a long history of left hip and groin pain. He describes his pain as worse with weightbearing and rotation of the hip. He reports associated decrease in left hip range of motion. He denies associated numbness or tingling.   The patient's symptoms have progressed to the point that they decrease his quality of life. The patient has previously undergone conservative treatment including NSAIDS and activity modification without adequate control of his symptoms.  Denies history of blood clots, denies lumbar surgery.  Past Medical, Surgical, Family, Social History, Allergies, Medications:   Past Medical History:  Past Medical History:  Diagnosis Date  . Anxiety  . Chicken pox  . Chronic insomnia  . Cluster headaches  RIGHT/HEMICRANIA  . COPD (chronic obstructive pulmonary disease) (CMS-HCC)  . Esophageal varices (CMS-HCC)  . Gastritis and duodenitis  . Hyperlipidemia  . Sleep apnea  SEVERE OSA  . Smoker  . Stricture of esophagus  . Type 2 diabetes mellitus without complication (CMS-HCC)  . Unspecified essential hypertension  . Variants of migraine, not elsewhere classified, with intractable migraine, so stated, without mention of status migrainosus  . Variants of migraine, not elsewhere classified, with intractable migraine, so stated, without mention of status migrainosus 11/07/2013   Past Surgical History:  Past Surgical History:  Procedure Laterality Date  . Right total hip  arthroplasty 05/19/2018  Dr Marry Guan   Current Medications:  Current Outpatient Medications  Medication Sig Dispense Refill  . acetaminophen (TYLENOL) 650 MG ER tablet Take 650 mg by mouth every 8 (eight) hours as needed  . ascorbic acid, vitamin C, (VITAMIN C) 1000 MG tablet Take 1,000 mg by mouth 3 (three) times daily  . aspirin 81 MG EC tablet Take 81 mg by mouth once daily  . ASPIRIN/SALICYLAMIDE/CAFFEINE (BC HEADACHE POWDER ORAL) Take 1 packet by mouth as needed (up to 5/day for aches/pain).  Marland Kitchen atorvastatin (LIPITOR) 80 MG tablet Take 1 tablet (80 mg total) by mouth once daily 30 tablet 11  . calcium carbonate (TUMS) 200 mg calcium (500 mg) chewable tablet Take 1 tablet by mouth once daily  . CALCIUM-MAGNESIUM-ZINC ORAL Take 1 tablet by mouth once daily  . celecoxib (CELEBREX) 200 MG capsule Take 1 capsule (200 mg total) by mouth 2 (two) times daily 60 capsule 0  . cholecalciferol (VITAMIN D3) 5,000 unit capsule Take 5,000 Units by mouth once daily  . cyanocobalamin (VITAMIN B12) 1000 MCG tablet Take 1,000 mcg by mouth once daily  . diphenhydrAMINE (BENADRYL) 25 mg tablet Take 25 mg by mouth nightly as needed  . hydroCHLOROthiazide (HYDRODIURIL) 25 MG tablet Take 1 tablet (25 mg total) by mouth every morning For blood pressure 90 tablet 3  . multivitamin with minerals tablet Take 1 tablet by mouth once daily  . omeprazole (PRILOSEC) 20 MG DR capsule Take 1 capsule (20 mg total) by mouth once daily 30 capsule 11  . verapamiL (CALAN-SR) 240 MG SR tablet Take 1 tablet (240 mg total) by mouth nightly 90 tablet 3  No current facility-administered medications for this visit.   Allergies:  Allergies  Allergen Reactions  . Gabapentin Headache  Migraines  . Pregabalin Other (See Comments)  Blood in stool  . Fluoxetine Other (See Comments)  insomnia   Social History:  Social History   Socioeconomic History  . Marital status: Single  Spouse name: Not on file  . Number of children: 0   . Years of education: 8  . Highest education level: Not on file  Occupational History  . Occupation: Disabled  Tobacco Use  . Smoking status: Current Every Day Smoker  Packs/day: 2.00  Years: 40.00  Pack years: 80.00  Types: Cigarettes  . Smokeless tobacco: Never Used  . Tobacco comment: unable to quit with Chantix  Substance and Sexual Activity  . Alcohol use: Never  Alcohol/week: 0.0 standard drinks  . Drug use: Never  . Sexual activity: Not Currently  Partners: Female  Other Topics Concern  . Not on file  Social History Narrative  Long-term unemployed   Social Determinants of Health   Financial Resource Strain:  . Difficulty of Paying Living Expenses:  Food Insecurity:  . Worried About Charity fundraiser in the Last Year:  . Arboriculturist in the Last Year:  Transportation Needs:  . Film/video editor (Medical):  Marland Kitchen Lack of Transportation (Non-Medical):  Physical Activity: Unknown  . Days of Exercise per Week: 0 days  . Minutes of Exercise per Session: Not on file  Stress:  . Feeling of Stress :  Social Connections:  . Frequency of Communication with Friends and Family:  . Frequency of Social Gatherings with Friends and Family:  . Attends Religious Services:  . Active Member of Clubs or Organizations:  . Attends Archivist Meetings:  Marland Kitchen Marital Status:   Family History:  Family History  Problem Relation Age of Onset  . Heart disease Mother  . High blood pressure (Hypertension) Mother  . Arthritis Father  . Diabetes type II Brother   Review of Systems:   A 10+ ROS was performed, reviewed, and the pertinent orthopaedic findings are documented in the HPI.   Physical Examination:   BP 130/80  Ht 185.4 cm (6\' 1" )  Wt (!) 133.6 kg (294 lb 9.6 oz)  BMI 38.87 kg/m   Patient is a well-developed, well-nourished male in no acute distress. Patient has normal mood and affect. Patient is alert and oriented to person, place, and time.   HEENT:  Atraumatic, normocephalic. Pupils equal and reactive to light. Extraocular motion intact. Noninjected sclera.  Cardiovascular: Regular rate and rhythm, with no murmurs, rubs, or gallops. Distal pulses palpable.  Respiratory: Lungs clear to auscultation bilaterally.   Left Hip: Pelvic tilt: Negative Limb lengths: Equal with the patient standing Soft tissue swelling: Negative Erythema: Negative Crepitance: Negative Tenderness: Greater trochanter is nontender to palpation. Severe pain is elicited by axial compression or extremes of rotation. Atrophy: No atrophy. Good hip flexor and abductor strength. Range of Motion: EXT/FLEX: 0/0/100 ADD/ABD: 20/0/20 IR/ER: 0/0/30  Sensation is intact over the saphenous, lateral cutaneous, superficial fibular, and deep fibular nerve distributions.  Tests Performed/Reviewed:  X-rays  An anteroposterior view of the pelvis as well as anteroposterior and lateral views of the left hip were obtained. Images reveal severe loss of femoral acetabular joint space with significant osteophyte formation noted. No fractures or dislocations noted. Right total hip arthroplasty without signs of loosening, migration, or failure is noted.  I personally ordered and interpreted today's radiographs.  Impression:   ICD-10-CM  1. Primary osteoarthritis of left hip M16.12   Plan:   The patient has end-stage degenerative changes of the left hip. It was explained to the patient that the condition is progressive in nature. Having failed conservative treatment, the patient has elected to proceed with a total joint arthroplasty. The patient will undergo a total joint arthroplasty with Dr. Marry Guan. The risks of surgery, including blood clot and infection, were discussed with the patient. Measures to reduce these risks, including the use of anticoagulation, perioperative antibiotics, and early ambulation were discussed. The importance of postoperative physical therapy was discussed  with the patient. The patient elects to proceed with surgery. The patient is instructed to stop all blood thinners prior to surgery. The patient is instructed to call the hospital the day before surgery to learn of the proper arrival time.   Contact our office with any questions or concerns. Follow up as indicated, or sooner should any new problems arise, if conditions worsen, or if they are otherwise concerned.   Gwenlyn Fudge, PA-C [facility] [place], [place] [address] Phone: [phone]  This note was generated in part with voice recognition software and I apologize for any typographical errors that were not detected and corrected.   Electronically signed by Gwenlyn Fudge, PA at 11/14/2019 7:07 PM EDT

## 2019-11-16 ENCOUNTER — Encounter
Admission: RE | Disposition: A | Discharge status: Home Health | Payer: Self-pay | Source: Home / Self Care | Attending: Orthopedic Surgery

## 2019-11-16 ENCOUNTER — Inpatient Hospital Stay: Payer: Medicaid Other | Admitting: Anesthesiology

## 2019-11-16 ENCOUNTER — Encounter: Payer: Self-pay | Admitting: Orthopedic Surgery

## 2019-11-16 ENCOUNTER — Inpatient Hospital Stay
Admission: RE | Admit: 2019-11-16 | Discharge: 2019-11-18 | DRG: 470 | Disposition: A | Discharge status: Home Health | Payer: Medicaid Other | Attending: Orthopedic Surgery | Admitting: Orthopedic Surgery

## 2019-11-16 ENCOUNTER — Inpatient Hospital Stay: Payer: Medicaid Other

## 2019-11-16 ENCOUNTER — Other Ambulatory Visit: Payer: Self-pay

## 2019-11-16 DIAGNOSIS — F419 Anxiety disorder, unspecified: Secondary | ICD-10-CM | POA: Diagnosis present

## 2019-11-16 DIAGNOSIS — Z888 Allergy status to other drugs, medicaments and biological substances status: Secondary | ICD-10-CM | POA: Diagnosis not present

## 2019-11-16 DIAGNOSIS — J449 Chronic obstructive pulmonary disease, unspecified: Secondary | ICD-10-CM | POA: Diagnosis present

## 2019-11-16 DIAGNOSIS — E119 Type 2 diabetes mellitus without complications: Secondary | ICD-10-CM | POA: Diagnosis present

## 2019-11-16 DIAGNOSIS — F5104 Psychophysiologic insomnia: Secondary | ICD-10-CM | POA: Diagnosis present

## 2019-11-16 DIAGNOSIS — I10 Essential (primary) hypertension: Secondary | ICD-10-CM | POA: Diagnosis present

## 2019-11-16 DIAGNOSIS — Z7982 Long term (current) use of aspirin: Secondary | ICD-10-CM | POA: Diagnosis not present

## 2019-11-16 DIAGNOSIS — E785 Hyperlipidemia, unspecified: Secondary | ICD-10-CM | POA: Diagnosis present

## 2019-11-16 DIAGNOSIS — Z833 Family history of diabetes mellitus: Secondary | ICD-10-CM | POA: Diagnosis not present

## 2019-11-16 DIAGNOSIS — R339 Retention of urine, unspecified: Secondary | ICD-10-CM | POA: Diagnosis not present

## 2019-11-16 DIAGNOSIS — Z96649 Presence of unspecified artificial hip joint: Secondary | ICD-10-CM

## 2019-11-16 DIAGNOSIS — Z79899 Other long term (current) drug therapy: Secondary | ICD-10-CM | POA: Diagnosis not present

## 2019-11-16 DIAGNOSIS — M1612 Unilateral primary osteoarthritis, left hip: Secondary | ICD-10-CM | POA: Diagnosis present

## 2019-11-16 DIAGNOSIS — F17210 Nicotine dependence, cigarettes, uncomplicated: Secondary | ICD-10-CM | POA: Diagnosis present

## 2019-11-16 DIAGNOSIS — G4733 Obstructive sleep apnea (adult) (pediatric): Secondary | ICD-10-CM | POA: Diagnosis present

## 2019-11-16 DIAGNOSIS — Z8249 Family history of ischemic heart disease and other diseases of the circulatory system: Secondary | ICD-10-CM | POA: Diagnosis not present

## 2019-11-16 HISTORY — PX: TOTAL HIP ARTHROPLASTY: SHX124

## 2019-11-16 SURGERY — ARTHROPLASTY, HIP, TOTAL,POSTERIOR APPROACH
Anesthesia: Spinal | Site: Hip | Laterality: Left

## 2019-11-16 MED ORDER — CALCIUM CARBONATE ANTACID 500 MG PO CHEW
4.0000 | CHEWABLE_TABLET | Freq: Every day | ORAL | Status: DC
  Administered 2019-11-17 – 2019-11-18 (×2): 800 mg via ORAL
  Filled 2019-11-16 (×2): qty 4

## 2019-11-16 MED ORDER — METOCLOPRAMIDE HCL 10 MG PO TABS: 5.0000 mg | ORAL_TABLET | Freq: Three times a day (TID) | ORAL | Status: DC | PRN

## 2019-11-16 MED ORDER — FENTANYL CITRATE (PF) 100 MCG/2ML IJ SOLN: 25.0000 ug | INTRAMUSCULAR | Status: DC | PRN

## 2019-11-16 MED ORDER — MAGNESIUM HYDROXIDE 400 MG/5ML PO SUSP
30.0000 mL | Freq: Every day | ORAL | Status: DC
  Administered 2019-11-16 – 2019-11-17 (×2): 30 mL via ORAL
  Filled 2019-11-16 (×2): qty 30

## 2019-11-16 MED ORDER — NEOMYCIN-POLYMYXIN B GU 40-200000 IR SOLN
Status: AC
  Filled 2019-11-16: qty 20

## 2019-11-16 MED ORDER — CEFAZOLIN SODIUM-DEXTROSE 2-4 GM/100ML-% IV SOLN
2.0000 g | Freq: Four times a day (QID) | INTRAVENOUS | Status: AC
  Administered 2019-11-16 – 2019-11-17 (×2): 2 g via INTRAVENOUS
  Filled 2019-11-16 (×3): qty 100

## 2019-11-16 MED ORDER — ENOXAPARIN SODIUM 40 MG/0.4ML ~~LOC~~ SOLN
30.0000 mg | Freq: Two times a day (BID) | SUBCUTANEOUS | Status: DC
  Administered 2019-11-17 – 2019-11-18 (×3): 30 mg via SUBCUTANEOUS
  Filled 2019-11-16 (×3): qty 0.4

## 2019-11-16 MED ORDER — DEXAMETHASONE SODIUM PHOSPHATE 10 MG/ML IJ SOLN
INTRAMUSCULAR | Status: AC
  Administered 2019-11-16: 8 mg via INTRAVENOUS
  Filled 2019-11-16: qty 1

## 2019-11-16 MED ORDER — SODIUM CHLORIDE 0.9 % IV SOLN
INTRAVENOUS | Status: DC | PRN
  Administered 2019-11-16: 30 ug/min via INTRAVENOUS

## 2019-11-16 MED ORDER — PROPOFOL 500 MG/50ML IV EMUL
INTRAVENOUS | Status: DC | PRN
  Administered 2019-11-16: 75 ug/kg/min via INTRAVENOUS

## 2019-11-16 MED ORDER — CEFAZOLIN SODIUM-DEXTROSE 2-4 GM/100ML-% IV SOLN
INTRAVENOUS | Status: AC
  Administered 2019-11-16: 2 g via INTRAVENOUS
  Filled 2019-11-16: qty 100

## 2019-11-16 MED ORDER — ONDANSETRON HCL 4 MG/2ML IJ SOLN
4.0000 mg | Freq: Once | INTRAMUSCULAR | Status: DC | PRN
Start: 2019-11-16 — End: 2019-11-16

## 2019-11-16 MED ORDER — ACETAMINOPHEN 10 MG/ML IV SOLN
1000.0000 mg | Freq: Four times a day (QID) | INTRAVENOUS | Status: AC
  Administered 2019-11-16 – 2019-11-17 (×3): 1000 mg via INTRAVENOUS
  Filled 2019-11-16 (×3): qty 100

## 2019-11-16 MED ORDER — CEFAZOLIN SODIUM-DEXTROSE 2-4 GM/100ML-% IV SOLN
INTRAVENOUS | Status: AC
  Filled 2019-11-16: qty 100

## 2019-11-16 MED ORDER — ACETAMINOPHEN 10 MG/ML IV SOLN
INTRAVENOUS | Status: DC | PRN
  Administered 2019-11-16: 1000 mg via INTRAVENOUS

## 2019-11-16 MED ORDER — MIDAZOLAM HCL 5 MG/5ML IJ SOLN
INTRAMUSCULAR | Status: DC | PRN
  Administered 2019-11-16: 2 mg via INTRAVENOUS

## 2019-11-16 MED ORDER — TRANEXAMIC ACID-NACL 1000-0.7 MG/100ML-% IV SOLN
1000.0000 mg | INTRAVENOUS | Status: AC
  Administered 2019-11-16: 1000 mg via INTRAVENOUS

## 2019-11-16 MED ORDER — ACETAMINOPHEN 325 MG PO TABS: 325.0000 mg | ORAL_TABLET | Freq: Four times a day (QID) | ORAL | Status: DC | PRN

## 2019-11-16 MED ORDER — VITAMIN B-12 1000 MCG PO TABS
1000.0000 ug | ORAL_TABLET | Freq: Every day | ORAL | Status: DC
  Administered 2019-11-17 – 2019-11-18 (×2): 1000 ug via ORAL
  Filled 2019-11-16 (×2): qty 1

## 2019-11-16 MED ORDER — TRANEXAMIC ACID-NACL 1000-0.7 MG/100ML-% IV SOLN
INTRAVENOUS | Status: AC
  Administered 2019-11-16: 1000 mg via INTRAVENOUS
  Filled 2019-11-16: qty 100

## 2019-11-16 MED ORDER — PROPOFOL 500 MG/50ML IV EMUL
INTRAVENOUS | Status: AC
  Filled 2019-11-16: qty 50

## 2019-11-16 MED ORDER — LACTATED RINGERS IV SOLN: INTRAVENOUS | Status: DC

## 2019-11-16 MED ORDER — OXYCODONE HCL 5 MG PO TABS
5.0000 mg | ORAL_TABLET | ORAL | Status: DC | PRN
  Administered 2019-11-16 – 2019-11-18 (×4): 5 mg via ORAL
  Filled 2019-11-16 (×4): qty 1

## 2019-11-16 MED ORDER — CELECOXIB 200 MG PO CAPS: 400.0000 mg | ORAL_CAPSULE | Freq: Once | ORAL | Status: AC

## 2019-11-16 MED ORDER — ONDANSETRON HCL 4 MG PO TABS: 4.0000 mg | ORAL_TABLET | Freq: Four times a day (QID) | ORAL | Status: DC | PRN

## 2019-11-16 MED ORDER — PREGABALIN 75 MG PO CAPS: 75.0000 mg | ORAL_CAPSULE | Freq: Once | ORAL | Status: DC

## 2019-11-16 MED ORDER — PANTOPRAZOLE SODIUM 40 MG PO TBEC
40.0000 mg | DELAYED_RELEASE_TABLET | Freq: Two times a day (BID) | ORAL | Status: DC
  Administered 2019-11-16 – 2019-11-18 (×4): 40 mg via ORAL
  Filled 2019-11-16 (×4): qty 1

## 2019-11-16 MED ORDER — OXYCODONE HCL 5 MG PO TABS: 10.0000 mg | ORAL_TABLET | ORAL | Status: DC | PRN

## 2019-11-16 MED ORDER — ACETAMINOPHEN 10 MG/ML IV SOLN
INTRAVENOUS | Status: AC
  Filled 2019-11-16: qty 100

## 2019-11-16 MED ORDER — CELECOXIB 200 MG PO CAPS
200.0000 mg | ORAL_CAPSULE | Freq: Two times a day (BID) | ORAL | Status: DC
  Administered 2019-11-16 – 2019-11-18 (×4): 200 mg via ORAL
  Filled 2019-11-16 (×4): qty 1

## 2019-11-16 MED ORDER — METOCLOPRAMIDE HCL 10 MG PO TABS
10.0000 mg | ORAL_TABLET | Freq: Three times a day (TID) | ORAL | Status: AC
  Administered 2019-11-16 – 2019-11-18 (×8): 10 mg via ORAL
  Filled 2019-11-16 (×8): qty 1

## 2019-11-16 MED ORDER — ONDANSETRON HCL 4 MG/2ML IJ SOLN: 4.0000 mg | Freq: Once | INTRAMUSCULAR | Status: DC | PRN

## 2019-11-16 MED ORDER — PROPOFOL 10 MG/ML IV BOLUS
INTRAVENOUS | Status: AC
  Filled 2019-11-16: qty 20

## 2019-11-16 MED ORDER — TRAMADOL HCL 50 MG PO TABS
50.0000 mg | ORAL_TABLET | ORAL | Status: DC | PRN
  Administered 2019-11-17 (×2): 100 mg via ORAL
  Filled 2019-11-16 (×2): qty 2

## 2019-11-16 MED ORDER — HYDROMORPHONE HCL 1 MG/ML IJ SOLN: 0.5000 mg | INTRAMUSCULAR | Status: DC | PRN

## 2019-11-16 MED ORDER — BISACODYL 10 MG RE SUPP: 10.0000 mg | Freq: Every day | RECTAL | Status: DC | PRN

## 2019-11-16 MED ORDER — PHENYLEPHRINE HCL (PRESSORS) 10 MG/ML IV SOLN
INTRAVENOUS | Status: DC | PRN
  Administered 2019-11-16: 100 ug via INTRAVENOUS

## 2019-11-16 MED ORDER — BUPIVACAINE HCL (PF) 0.5 % IJ SOLN
INTRAMUSCULAR | Status: DC | PRN
  Administered 2019-11-16: 3.2 mL

## 2019-11-16 MED ORDER — FENTANYL CITRATE (PF) 100 MCG/2ML IJ SOLN
INTRAMUSCULAR | Status: DC | PRN
  Administered 2019-11-16: 25 ug via INTRAVENOUS
  Administered 2019-11-16: 50 ug via INTRAVENOUS
  Administered 2019-11-16: 25 ug via INTRAVENOUS

## 2019-11-16 MED ORDER — ATORVASTATIN CALCIUM 80 MG PO TABS
80.0000 mg | ORAL_TABLET | Freq: Every day | ORAL | Status: DC
  Administered 2019-11-16 – 2019-11-18 (×3): 80 mg via ORAL
  Filled 2019-11-16 (×3): qty 4
  Filled 2019-11-16 (×2): qty 1
  Filled 2019-11-16: qty 4
  Filled 2019-11-16: qty 1

## 2019-11-16 MED ORDER — ALUM & MAG HYDROXIDE-SIMETH 200-200-20 MG/5ML PO SUSP: 30.0000 mL | ORAL | Status: DC | PRN

## 2019-11-16 MED ORDER — ONDANSETRON HCL 4 MG/2ML IJ SOLN
INTRAMUSCULAR | Status: DC | PRN
  Administered 2019-11-16: 4 mg via INTRAVENOUS

## 2019-11-16 MED ORDER — MAGNESIUM OXIDE 400 (241.3 MG) MG PO TABS
800.0000 mg | ORAL_TABLET | Freq: Every day | ORAL | Status: DC
  Administered 2019-11-17 – 2019-11-18 (×2): 800 mg via ORAL
  Filled 2019-11-16 (×2): qty 2

## 2019-11-16 MED ORDER — VITAMIN D3 25 MCG (1000 UNIT) PO TABS
5000.0000 [IU] | ORAL_TABLET | Freq: Every day | ORAL | Status: DC
  Administered 2019-11-17 – 2019-11-18 (×2): 5000 [IU] via ORAL
  Filled 2019-11-16 (×5): qty 5

## 2019-11-16 MED ORDER — SENNOSIDES-DOCUSATE SODIUM 8.6-50 MG PO TABS
1.0000 | ORAL_TABLET | Freq: Two times a day (BID) | ORAL | Status: DC
  Administered 2019-11-16 – 2019-11-18 (×4): 1 via ORAL
  Filled 2019-11-16 (×4): qty 1

## 2019-11-16 MED ORDER — MIDAZOLAM HCL 2 MG/2ML IJ SOLN
INTRAMUSCULAR | Status: AC
  Filled 2019-11-16: qty 2

## 2019-11-16 MED ORDER — CEFAZOLIN SODIUM-DEXTROSE 2-4 GM/100ML-% IV SOLN
2.0000 g | INTRAVENOUS | Status: AC
  Administered 2019-11-16: 2 g via INTRAVENOUS

## 2019-11-16 MED ORDER — DEXAMETHASONE SODIUM PHOSPHATE 10 MG/ML IJ SOLN: 8.0000 mg | Freq: Once | INTRAMUSCULAR | Status: AC

## 2019-11-16 MED ORDER — FENTANYL CITRATE (PF) 100 MCG/2ML IJ SOLN
INTRAMUSCULAR | Status: AC
  Filled 2019-11-16: qty 2

## 2019-11-16 MED ORDER — HYDROCHLOROTHIAZIDE 25 MG PO TABS
25.0000 mg | ORAL_TABLET | Freq: Every day | ORAL | Status: DC
  Administered 2019-11-16 – 2019-11-18 (×3): 25 mg via ORAL
  Filled 2019-11-16 (×4): qty 1

## 2019-11-16 MED ORDER — TRANEXAMIC ACID-NACL 1000-0.7 MG/100ML-% IV SOLN: 1000.0000 mg | Freq: Once | INTRAVENOUS | Status: AC

## 2019-11-16 MED ORDER — PROPOFOL 10 MG/ML IV BOLUS
INTRAVENOUS | Status: DC | PRN
  Administered 2019-11-16 (×2): 50 mg via INTRAVENOUS

## 2019-11-16 MED ORDER — FLEET ENEMA 7-19 GM/118ML RE ENEM: 1.0000 | ENEMA | Freq: Once | RECTAL | Status: DC | PRN

## 2019-11-16 MED ORDER — PHENOL 1.4 % MT LIQD
1.0000 | OROMUCOSAL | Status: DC | PRN
  Filled 2019-11-16: qty 177

## 2019-11-16 MED ORDER — METOCLOPRAMIDE HCL 5 MG/ML IJ SOLN: 5.0000 mg | Freq: Three times a day (TID) | INTRAMUSCULAR | Status: DC | PRN

## 2019-11-16 MED ORDER — NEOMYCIN-POLYMYXIN B GU 40-200000 IR SOLN
Status: DC | PRN
  Administered 2019-11-16: 16 mL

## 2019-11-16 MED ORDER — SODIUM CHLORIDE 0.9 % IV SOLN: INTRAVENOUS | Status: DC

## 2019-11-16 MED ORDER — CELECOXIB 200 MG PO CAPS
ORAL_CAPSULE | ORAL | Status: AC
  Administered 2019-11-16: 400 mg via ORAL
  Filled 2019-11-16: qty 2

## 2019-11-16 MED ORDER — ENSURE PRE-SURGERY PO LIQD
296.0000 mL | Freq: Once | ORAL | Status: AC
  Administered 2019-11-16: 296 mL via ORAL
  Filled 2019-11-16: qty 296

## 2019-11-16 MED ORDER — MENTHOL 3 MG MT LOZG
1.0000 | LOZENGE | OROMUCOSAL | Status: DC | PRN
  Filled 2019-11-16: qty 9

## 2019-11-16 MED ORDER — DIPHENHYDRAMINE HCL 12.5 MG/5ML PO ELIX: 12.5000 mg | ORAL_SOLUTION | ORAL | Status: DC | PRN

## 2019-11-16 MED ORDER — ASCORBIC ACID 500 MG PO TABS
1000.0000 mg | ORAL_TABLET | Freq: Every day | ORAL | Status: DC
  Administered 2019-11-17 – 2019-11-18 (×2): 1000 mg via ORAL
  Filled 2019-11-16 (×2): qty 2

## 2019-11-16 MED ORDER — CHLORHEXIDINE GLUCONATE 4 % EX LIQD: 60.0000 mL | Freq: Once | CUTANEOUS | Status: DC

## 2019-11-16 MED ORDER — TRANEXAMIC ACID-NACL 1000-0.7 MG/100ML-% IV SOLN
INTRAVENOUS | Status: AC
  Filled 2019-11-16: qty 100

## 2019-11-16 MED ORDER — ONDANSETRON HCL 4 MG/2ML IJ SOLN: 4.0000 mg | Freq: Four times a day (QID) | INTRAMUSCULAR | Status: DC | PRN

## 2019-11-16 MED ORDER — VERAPAMIL HCL ER 240 MG PO TBCR
240.0000 mg | EXTENDED_RELEASE_TABLET | Freq: Every day | ORAL | Status: DC
  Administered 2019-11-17 – 2019-11-18 (×2): 240 mg via ORAL
  Filled 2019-11-16 (×3): qty 1

## 2019-11-16 MED ORDER — FERROUS SULFATE 325 (65 FE) MG PO TABS
325.0000 mg | ORAL_TABLET | Freq: Two times a day (BID) | ORAL | Status: DC
  Administered 2019-11-16 – 2019-11-18 (×4): 325 mg via ORAL
  Filled 2019-11-16 (×4): qty 1

## 2019-11-16 SURGICAL SUPPLY — 61 items
BLADE DRUM FLTD (BLADE) ×3 IMPLANT
BLADE SAW 90X25X1.19 OSCILLAT (BLADE) ×3 IMPLANT
CANISTER SUCT 1200ML W/VALVE (MISCELLANEOUS) ×3 IMPLANT
CANISTER SUCT 3000ML PPV (MISCELLANEOUS) ×6 IMPLANT
CARTRIDGE OIL MAESTRO DRILL (MISCELLANEOUS) ×1 IMPLANT
COVER WAND RF STERILE (DRAPES) ×3 IMPLANT
CUP PINNACLE 100 SERIES 58MM (Hips) ×2 IMPLANT
DIFFUSER DRILL AIR PNEUMATIC (MISCELLANEOUS) ×3 IMPLANT
DRAPE 3/4 80X56 (DRAPES) ×3 IMPLANT
DRAPE INCISE IOBAN 66X60 STRL (DRAPES) ×3 IMPLANT
DRSG DERMACEA 8X12 NADH (GAUZE/BANDAGES/DRESSINGS) ×3 IMPLANT
DRSG OPSITE POSTOP 4X12 (GAUZE/BANDAGES/DRESSINGS) ×1 IMPLANT
DRSG OPSITE POSTOP 4X14 (GAUZE/BANDAGES/DRESSINGS) ×2 IMPLANT
DRSG TEGADERM 4X4.75 (GAUZE/BANDAGES/DRESSINGS) ×3 IMPLANT
DURAPREP 26ML APPLICATOR (WOUND CARE) ×5 IMPLANT
ELECT REM PT RETURN 9FT ADLT (ELECTROSURGICAL) ×3
ELECTRODE REM PT RTRN 9FT ADLT (ELECTROSURGICAL) ×1 IMPLANT
GLOVE BIO SURGEON STRL SZ7.5 (GLOVE) ×6 IMPLANT
GLOVE BIOGEL M STRL SZ7.5 (GLOVE) ×6 IMPLANT
GLOVE BIOGEL PI IND STRL 7.5 (GLOVE) ×1 IMPLANT
GLOVE BIOGEL PI INDICATOR 7.5 (GLOVE) ×12
GLOVE INDICATOR 8.0 STRL GRN (GLOVE) ×3 IMPLANT
GOWN STRL REUS W/ TWL LRG LVL3 (GOWN DISPOSABLE) ×2 IMPLANT
GOWN STRL REUS W/ TWL XL LVL3 (GOWN DISPOSABLE) ×1 IMPLANT
GOWN STRL REUS W/TWL LRG LVL3 (GOWN DISPOSABLE) ×8
GOWN STRL REUS W/TWL XL LVL3 (GOWN DISPOSABLE) ×2
HANDLE YANKAUER SUCT BULB TIP (MISCELLANEOUS) ×4 IMPLANT
HEAD M SROM 36MM PLUS 1.5 (Hips) IMPLANT
HEMOVAC 400CC 10FR (MISCELLANEOUS) ×3 IMPLANT
HOLDER FOLEY CATH W/STRAP (MISCELLANEOUS) ×3 IMPLANT
HOOD PEEL AWAY FLYTE STAYCOOL (MISCELLANEOUS) ×6 IMPLANT
KIT TURNOVER KIT A (KITS) ×3 IMPLANT
LINER MARATHON NEUT +4X58X36 (Hips) ×2 IMPLANT
MANIFOLD NEPTUNE II (INSTRUMENTS) ×3 IMPLANT
NDL SAFETY ECLIPSE 18X1.5 (NEEDLE) ×1 IMPLANT
NEEDLE HYPO 18GX1.5 SHARP (NEEDLE) ×2
NS IRRIG 500ML POUR BTL (IV SOLUTION) ×3 IMPLANT
OIL CARTRIDGE MAESTRO DRILL (MISCELLANEOUS) ×3
PACK HIP PROSTHESIS (MISCELLANEOUS) ×3 IMPLANT
PENCIL SMOKE ULTRAEVAC 22 CON (MISCELLANEOUS) ×3 IMPLANT
PIN STEIN THRED 5/32 (Pin) ×3 IMPLANT
PULSAVAC PLUS IRRIG FAN TIP (DISPOSABLE) ×3
SOL .9 NS 3000ML IRR  AL (IV SOLUTION) ×2
SOL .9 NS 3000ML IRR UROMATIC (IV SOLUTION) ×1 IMPLANT
SOL PREP PVP 2OZ (MISCELLANEOUS) ×3
SOLUTION PREP PVP 2OZ (MISCELLANEOUS) ×1 IMPLANT
SPONGE DRAIN TRACH 4X4 STRL 2S (GAUZE/BANDAGES/DRESSINGS) ×3 IMPLANT
SROM M HEAD 36MM PLUS 1.5 (Hips) ×3 IMPLANT
STAPLER SKIN PROX 35W (STAPLE) ×3 IMPLANT
STEM FEM CMNTLSS LG AML 15.0 (Hips) ×2 IMPLANT
SUT ETHIBOND #5 BRAIDED 30INL (SUTURE) ×3 IMPLANT
SUT VIC AB 0 CT1 36 (SUTURE) ×3 IMPLANT
SUT VIC AB 1 CT1 36 (SUTURE) ×6 IMPLANT
SUT VIC AB 2-0 CT1 27 (SUTURE) ×2
SUT VIC AB 2-0 CT1 TAPERPNT 27 (SUTURE) ×1 IMPLANT
SYR 20ML LL LF (SYRINGE) ×3 IMPLANT
TAPE CLOTH 3X10 WHT NS LF (GAUZE/BANDAGES/DRESSINGS) ×3 IMPLANT
TAPE TRANSPORE STRL 2 31045 (GAUZE/BANDAGES/DRESSINGS) ×3 IMPLANT
TIP FAN IRRIG PULSAVAC PLUS (DISPOSABLE) ×1 IMPLANT
TOWEL OR 17X26 4PK STRL BLUE (TOWEL DISPOSABLE) ×3 IMPLANT
TRAY FOLEY MTR SLVR 16FR STAT (SET/KITS/TRAYS/PACK) ×3 IMPLANT

## 2019-11-16 NOTE — Anesthesia Preprocedure Evaluation (Signed)
Anesthesia Evaluation  Patient identified by MRN, date of birth, ID band Patient awake    Reviewed: Allergy & Precautions, H&P , NPO status , Patient's Chart, lab work & pertinent test results, reviewed documented beta blocker date and time   History of Anesthesia Complications Negative for: history of anesthetic complications  Airway Mallampati: III  TM Distance: >3 FB Neck ROM: full    Dental  (+) Caps, Dental Advidsory Given, Poor Dentition, Partial Upper, Missing   Pulmonary shortness of breath and with exertion, sleep apnea , COPD,  COPD inhaler, neg recent URI, Current Smoker,           Cardiovascular Exercise Tolerance: Good hypertension, (-) angina+ CAD  (-) Past MI, (-) Cardiac Stents and (-) CABG (-) dysrhythmias (-) Valvular Problems/Murmurs     Neuro/Psych  Headaches, neg Seizures PSYCHIATRIC DISORDERS Anxiety  Neuromuscular disease    GI/Hepatic Neg liver ROS, GERD  ,  Endo/Other  diabetesMorbid obesity  Renal/GU negative Renal ROS  negative genitourinary   Musculoskeletal  (+) Arthritis ,   Abdominal   Peds  Hematology negative hematology ROS (+)   Anesthesia Other Findings Past Medical History: No date: Anxiety No date: Arthritis No date: Chronic insomnia No date: COPD (chronic obstructive pulmonary disease) (HCC) No date: Dyspnea No date: Esophageal stricture No date: Esophageal varices (HCC) No date: GERD (gastroesophageal reflux disease) No date: Hyperlipidemia No date: Hypertension No date: Joint ache No date: Pre-diabetes No date: Reflux esophagitis No date: Sleep apnea   Reproductive/Obstetrics negative OB ROS                             Anesthesia Physical  Anesthesia Plan  ASA: III  Anesthesia Plan: Spinal   Post-op Pain Management:    Induction:   PONV Risk Score and Plan: 0 and Propofol infusion and TIVA  Airway Management Planned: Natural  Airway and Simple Face Mask  Additional Equipment:   Intra-op Plan:   Post-operative Plan:   Informed Consent: I have reviewed the patients History and Physical, chart, labs and discussed the procedure including the risks, benefits and alternatives for the proposed anesthesia with the patient or authorized representative who has indicated his/her understanding and acceptance.     Dental Advisory Given  Plan Discussed with: Anesthesiologist, CRNA and Surgeon  Anesthesia Plan Comments: (Patient prefers spinal.)        Anesthesia Quick Evaluation

## 2019-11-16 NOTE — H&P (Signed)
The patient has been re-examined, and the chart reviewed, and there have been no interval changes to the documented history and physical.    The risks, benefits, and alternatives have been discussed at length. The patient expressed understanding of the risks benefits and agreed with plans for surgical intervention.  James P. Hooten, Jr. M.D.    

## 2019-11-16 NOTE — Transfer of Care (Signed)
Immediate Anesthesia Transfer of Care Note  Patient: Johnny Williams  Procedure(s) Performed: TOTAL HIP ARTHROPLASTY (Left Hip)  Patient Location: PACU  Anesthesia Type:Spinal  Level of Consciousness: awake and sedated  Airway & Oxygen Therapy: Patient Spontanous Breathing and Patient connected to face mask oxygen  Post-op Assessment: Report given to RN and Post -op Vital signs reviewed and stable  Post vital signs: Reviewed and stable  Last Vitals:  Vitals Value Taken Time  BP 123/82 11/16/19 1117  Temp 36.4 C 11/16/19 1117  Pulse 71 11/16/19 1122  Resp 20 11/16/19 1122  SpO2 100 % 11/16/19 1122  Vitals shown include unvalidated device data.  Last Pain:  Vitals:   11/16/19 0628  TempSrc: Tympanic  PainSc: 2       Patients Stated Pain Goal: 0 ([id number])  Complications: No apparent anesthesia complications

## 2019-11-16 NOTE — Anesthesia Procedure Notes (Signed)
Spinal  Patient location during procedure: OR Start time: 11/16/2019 7:17 AM End time: 11/16/2019 7:22 AM Staffing Performed: resident/CRNA  Resident/CRNA: Nelda Marseille, CRNA Preanesthetic Checklist Completed: patient identified, IV checked, site marked, risks and benefits discussed, surgical consent, monitors and equipment checked, pre-op evaluation and timeout performed Spinal Block Patient position: sitting Prep: Betadine Patient monitoring: heart rate, continuous pulse ox, blood pressure and cardiac monitor Approach: midline Location: L3-4 Injection technique: single-shot Needle Needle type: Whitacre and Introducer  Needle gauge: 25 G Needle length: 9 cm Additional Notes Negative paresthesia. Negative blood return. Positive free-flowing CSF. Expiration date of kit checked and confirmed. Patient tolerated procedure well, without complications.

## 2019-11-16 NOTE — Evaluation (Signed)
Physical Therapy Evaluation Patient Details Name: Johnny Williams MRN: [id number] DOB: 1960/10/16 Today's Date: 11/16/2019   History of Present Illness  Johnny Williams is a 88yoM who comes to [facility] for Left THA. PMH includes Rt THA 2019.  Clinical Impression  Pt admitted with above diagnosis. Pt currently with functional limitations due to the deficits listed below (see "PT Problem List"). Upon entry, pt in bed, awake and agreeable to participate. The pt is alert and oriented x4, pleasant, conversational, and generally a good historian. Pt c/o suprapubic pain 8/10, inability to void- Johnny Williams decides to return after bladder scan and I/O cath. At return pt is educated on precautions and HEP with handout. Pt struggles with supine to sitting EOB, combination of large habitus, weakness of LLE, and weakness of trunk flexors. Pt requires use of multiple rails and author's assist of LLE OOB to floor. Pt also struggles with transition to standing ModA required from standard surface height, but can rise with supervision only from 21" elevated sitting surface. Once in standing, pt's performance truly shines, able to move about with good steadiness, safe use of RW, low fatigability, no pain limitations. Pt able to alterate Fwd and retro AMB as cued, but uses a RW for stability. Functional mobility assessment demonstrates increased effort/time requirements, poor tolerance, and need for physical assistance, whereas the patient performed these at a higher level of independence PTA. Pt's primary support is his mother, who is currently admitted at [facility] with [facility]. Pt has a younger brother ~30 miles away, unclear how much assistance he is able to provide. Pt will benefit from skilled PT intervention to increase independence and safety with basic mobility in preparation for discharge to the venue listed below.       Follow Up Recommendations SNF;Supervision for mobility/OOB;Follow surgeon's recommendation for DC plan and follow-up  therapies    Equipment Recommendations  None recommended by PT    Recommendations for Other Services       Precautions / Restrictions Precautions Precautions: Posterior Hip;Fall Precaution Booklet Issued: Yes (comment) Restrictions Weight Bearing Restrictions: Yes LLE Weight Bearing: Weight bearing as tolerated      Mobility  Bed Mobility Overal bed mobility: Needs Assistance Bed Mobility: Supine to Sit     Supine to sit: Supervision     General bed mobility comments: able to attend to hip precautions, but generally weak in trunk flexion, unable to come up without use of multiple rails.  Transfers Overall transfer level: Needs assistance Equipment used: Rolling walker (2 wheeled) Transfers: Sit to/from Stand Sit to Stand: Mod assist         General transfer comment: unable to rise fromEOB, collapses back to bed due to pain, but is able to rise from 21-22" high surface.  Ambulation/Gait   Gait Distance (Feet): 75 Feet Assistive device: Rolling walker (2 wheeled) Gait Pattern/deviations: Step-to pattern;WFL(Within Functional Limits)     General Gait Details: alternating FWD/retro at bedside, good management of RW, maintains precautions, has no LOB. Pt reports to feel good, denies any significant pain or fatigue.  Stairs            Wheelchair Mobility    Modified Rankin (Stroke Patients Only)       Balance Overall balance assessment: Needs assistance;Mild deficits observed, not formally tested Sitting-balance support: Single extremity supported;Feet supported Sitting balance-Leahy Scale: Fair     Standing balance support: Bilateral upper extremity supported;During functional activity Standing balance-Leahy Scale: Poor  Pertinent Vitals/Pain Pain Assessment: 0-10(suprapubic pain) Pain Score: 8  Pain Intervention(s): Limited activity within patient's tolerance;Monitored during session    Johnny Williams expects to be discharged to:: Private residence Living Arrangements: Parent(mother) Available Help at Discharge: Family(Neighbor Johnny Williams, younger Brother lives ~30 miles away.) Type of Home: Mobile home Home Access: Stairs to enter Entrance Stairs-Rails: Can reach both Entrance Stairs-Number of Steps: 2 Home Layout: One level Home Equipment: Walker - 2 wheels;Cane - single point      Prior Function Level of Independence: Independent         Comments: No RW use since prior surgery, able to AMB limited community distance PTA, still driving.     Hand Dominance        Extremity/Trunk Assessment   Upper Extremity Assessment Upper Extremity Assessment: Generalized weakness;Overall [facility] for tasks assessed    Lower Extremity Assessment Lower Extremity Assessment: Generalized weakness;Overall [facility] for tasks assessed    Cervical / Trunk Assessment Cervical / Trunk Assessment: Normal(large ABD distention)  Communication      Cognition Arousal/Alertness: Awake/alert Behavior During Therapy: WFL for tasks assessed/performed Overall Cognitive Status: Within Functional Limits for tasks assessed                                        General Comments      Exercises Total Joint Exercises Ankle Circles/Pumps: AROM;20 reps;Both;Supine Gluteal Sets: AROM;Left;10 reps;Supine Short Arc Quad: Left;20 reps;15 reps;5 reps;Supine;Limitations Short Arc Quad Limitations: just keeps on going, says it feels good Heel Slides: AROM;AAROM;Both;Supine;Other reps (comment);Limitations Heel Slides Limitations: 10x A/ROM Rt; 10x AA/ROM Left Hip ABduction/ADduction: AAROM;Left;10 reps;Supine   Assessment/Plan    PT Assessment Patient needs continued PT services  PT Problem List Decreased strength;Decreased range of motion;Decreased activity tolerance;Decreased balance;Decreased mobility;Decreased knowledge of precautions;Obesity       PT Treatment Interventions  DME instruction;Balance training;Gait training;Stair training;Functional mobility training;Therapeutic activities;Therapeutic exercise;Patient/family education    PT Goals (Current goals can be found in the Care Plan section)  Acute Rehab PT Goals Patient Stated Goal: Return to PLOF in independence PT Goal Formulation: With patient Time For Goal Achievement: 11/30/19 Potential to Achieve Goals: Fair    Frequency BID   Barriers to discharge Decreased caregiver support mother is primary support and ic currently admitted [facility] with [facility]    Co-evaluation               AM-PAC PT "6 Clicks" Mobility  Outcome Measure Help needed turning from your back to your side while in a flat bed without using bedrails?: A Lot Help needed moving from lying on your back to sitting on the side of a flat bed without using bedrails?: A Lot Help needed moving to and from a bed to a chair (including a wheelchair)?: A Lot Help needed standing up from a chair using your arms (e.g., wheelchair or bedside chair)?: A Lot Help needed to walk in hospital room?: A Little Help needed climbing 3-5 steps with a railing? : A Little 6 Click Score: 14    End of Session Equipment Utilized During Treatment: Gait belt Activity Tolerance: Patient tolerated treatment well;No increased pain Patient left: in chair;with chair alarm set;with call bell/phone within reach;with SCD's reapplied Nurse Communication: Mobility status PT Visit Diagnosis: Difficulty in walking, not elsewhere classified (R26.2);Other abnormalities of gait and mobility (R26.89);Muscle weakness (generalized) (M62.81)    Time: [id number] PT Time Calculation (  min) (ACUTE ONLY): 35 min   Charges:   PT Evaluation $PT Eval Moderate Complexity: 1 Mod PT Treatments $Gait Training: 8-22 mins $Therapeutic Exercise: 8-22 mins        6:35 PM, 11/16/19 Etta Grandchild, PT, DPT Physical Therapist - [facility]   [phone] ([place])    Emden C 11/16/2019, 6:30 PM

## 2019-11-16 NOTE — Op Note (Signed)
OPERATIVE NOTE  DATE OF SURGERY:  11/16/2019  PATIENT NAME:  Johnny Williams   DOB: 06/03/1961  MRN: [id number]  PRE-OPERATIVE DIAGNOSIS: Degenerative arthrosis of the left hip, primary  POST-OPERATIVE DIAGNOSIS:  Same  PROCEDURE:  Left total hip arthroplasty  SURGEON:  Marciano Sequin. M.D.  ASSISTANT: Cassell Smiles, PA-C (present and scrubbed throughout the case, critical for assistance with exposure, retraction, instrumentation, and closure)  ANESTHESIA: spinal  ESTIMATED BLOOD LOSS: 400 mL  FLUIDS REPLACED: 1200 mL of crystalloid  DRAINS: 2 medium drains to a Hemovac reservoir  IMPLANTS UTILIZED: DePuy 15 mm large stature AML femoral stem, 58 mm OD Pinnacle 100 acetabular component, +4 mm neutral Pinnacle Marathon polyethylene insert, and a 36 mm M-SPEC +1.5 mm hip ball  INDICATIONS FOR SURGERY: [name] is a 59 y.o. year old male with a long history of progressive hip and groin  pain. X-rays demonstrated severe degenerative changes. The patient had not seen any significant improvement despite conservative nonsurgical intervention. After discussion of the risks and benefits of surgical intervention, the patient expressed understanding of the risks benefits and agree with plans for total hip arthroplasty.   The risks, benefits, and alternatives were discussed at length including but not limited to the risks of infection, bleeding, nerve injury, stiffness, blood clots, the need for revision surgery, limb length inequality, dislocation, cardiopulmonary complications, among others, and they were willing to proceed.  PROCEDURE IN DETAIL: The patient was brought into the operating room and, after adequate spinal anesthesia was achieved, the patient was placed in a right lateral decubitus position. Axillary roll was placed and all bony prominences were well-padded. The patient's left hip was cleaned and prepped with alcohol and DuraPrep and draped in the usual sterile fashion. A "timeout" was  performed as per usual protocol. A lateral curvilinear incision was made gently curving towards the posterior superior iliac spine. The IT band was incised in line with the skin incision and the fibers of the gluteus maximus were split in line. The piriformis tendon was identified, skeletonized, and incised at its insertion to the proximal femur and reflected posteriorly. A T type posterior capsulotomy was performed. Prior to dislocation of the femoral head, a threaded Steinmann pin was inserted through a separate stab incision into the pelvis superior to the acetabulum and bent in the form of a stylus so as to assess limb length and hip offset throughout the procedure. The femoral head was then dislocated posteriorly. Inspection of the femoral head demonstrated severe degenerative changes with full-thickness loss of articular cartilage. The femoral neck cut was performed using an oscillating saw. The anterior capsule was elevated off of the femoral neck using a periosteal elevator. Attention was then directed to the acetabulum. The remnant of the labrum was excised using electrocautery. Inspection of the acetabulum also demonstrated significant degenerative changes. The acetabulum was reamed in sequential fashion up to a 57 mm diameter. Good punctate bleeding bone was encountered. A 58 mm Pinnacle 100 acetabular component was positioned and impacted into place. Good scratch fit was appreciated. A +4 mm neutral polyethylene trial was inserted.  Attention was then directed to the proximal femur. A hole for reaming of the proximal femoral canal was created using a high-speed burr. The femoral canal was reamed in sequential fashion up to a 14.5 mm diameter. This allowed for approximately 5 cm of scratch fit. Serial broaches were inserted up to a 15 mm large stature femoral broach. Calcar region was planed and a trial reduction was  performed using a 36 mm hip ball with a +1.5 mm neck length. Good equalization of limb  lengths and hip offset was appreciated and excellent stability was noted both anteriorly and posteriorly. Trial components were removed. The acetabular shell was irrigated with copious amounts of normal saline with antibiotic solution and suctioned dry. A +4 mm neutral Pinnacle Marathon polyethylene insert was positioned and impacted into place. Next, a 15 mm large stature AML femoral stem was positioned and impacted into place. Excellent scratch fit was appreciated. A trial reduction was again performed with a 36 mm hip ball with a +1.5 mm neck length. Again, good equalization of limb lengths was appreciated and excellent stability appreciated both anteriorly and posteriorly. The hip was then dislocated and the trial hip ball was removed. The Morse taper was cleaned and dried. A 36 mm M-SPEC hip ball with a +1.5 mm neck length was placed on the trunnion and impacted into place. The hip was then reduced and placed through range of motion. Excellent stability was appreciated both anteriorly and posteriorly.  The wound was irrigated with copious amounts of normal saline with antibiotic solution and suctioned dry. Good hemostasis was appreciated. The posterior capsulotomy was repaired using #5 Ethibond. Piriformis tendon was reapproximated to the undersurface of the gluteus medius tendon using #5 Ethibond. Two medium drains were placed in the wound bed and brought out through separate stab incisions to be attached to a Hemovac reservoir. The IT band was reapproximated using interrupted sutures of #1 Vicryl. Subcutaneous tissue was approximated using first #0 Vicryl followed by #2-0 Vicryl. The skin was closed with skin staples.  The patient tolerated the procedure well and was transported to the recovery room in stable condition.   Marciano Sequin., M.D.

## 2019-11-17 MED ORDER — TAMSULOSIN HCL 0.4 MG PO CAPS
0.4000 mg | ORAL_CAPSULE | Freq: Every day | ORAL | Status: DC
  Administered 2019-11-17 – 2019-11-18 (×2): 0.4 mg via ORAL
  Filled 2019-11-17 (×2): qty 1

## 2019-11-17 MED ORDER — NICOTINE 21 MG/24HR TD PT24
21.0000 mg | MEDICATED_PATCH | Freq: Every day | TRANSDERMAL | Status: DC
  Administered 2019-11-17 – 2019-11-18 (×2): 21 mg via TRANSDERMAL
  Filled 2019-11-17 (×2): qty 1

## 2019-11-17 NOTE — Plan of Care (Signed)

## 2019-11-17 NOTE — TOC Initial Note (Signed)
Transition of Care [facility]) - Initial/Assessment Note    Patient Details  Name: Johnny Williams MRN: [id number] Date of Birth: [birth year]  [facility] CM/SW Contact:    Su Hilt, RN Phone Number: 11/17/2019, 4:31 PM  Clinical Narrative:                  Met with the patient in the room, he lives at home with his mother He has a cane and a rolling walker in the home and will need a Bariatric 3 in 1 due to Body Habitus, he weighs 295.5 He is up to date with his PCP and can afford his medications that he gets at [facility] in [place], He still needs to have a BM to go home and be able to get out of the bed without assistance before going home,.  He is set up with [facility] He will have a 3 in 1 bariatric brought from [employer] No additional needs Expected Discharge Plan: [place] Barriers to Discharge: Continued Medical Work up   Patient Goals and CMS Choice        Expected Discharge Plan and Services Expected Discharge Plan: [place]   Discharge Planning Services: CM Consult   Living arrangements for the past 2 months: Single Family Home                 DME Arranged: 3-N-1 DME Agency: [employer] Date DME Agency Contacted: 11/17/19 Time DME Agency Contacted: 42 Representative spoke with at DME Agency: [place]: PT [place]: [facility] (formerly [employer]) Date [place]: 11/17/19 Time HH Agency Contacted: 39 Representative spoke with at [place]: [place] Arrangements/Services Living arrangements for the past 2 months: [place] Lives with:: Parents Patient language and need for interpreter reviewed:: Yes Do you feel safe going back to the place where you live?: Yes      Need for Family Participation in Patient Care: No (Comment) Care giver support system in place?: Yes (comment) Current home services: DME(cane and rolling walker) Criminal  Activity/Legal Involvement Pertinent to Current Situation/Hospitalization: No - Comment as needed  Activities of Daily Living Home Assistive Devices/Equipment: Dentures (specify type), Cane (specify quad or straight), Walker (specify type)(rolling walker; did not bring dentures to hospital) ADL Screening (condition at time of admission) Patient's cognitive ability adequate to safely complete daily activities?: Yes Is the patient deaf or have difficulty hearing?: No Does the patient have difficulty seeing, even when wearing glasses/contacts?: No Does the patient have difficulty concentrating, remembering, or making decisions?: No Patient able to express need for assistance with ADLs?: Yes Does the patient have difficulty dressing or bathing?: No Independently performs ADLs?: Yes (appropriate for developmental age) Does the patient have difficulty walking or climbing stairs?: Yes Weakness of Legs: Left Weakness of Arms/Hands: None  Permission Sought/Granted   Permission granted to share information with : Yes, Verbal Permission Granted              Emotional Assessment Appearance:: Appears stated age Attitude/Demeanor/Rapport: Engaged Affect (typically observed): Pleasant, Appropriate Orientation: : Oriented to Self, Oriented to Place, Oriented to  Time, Oriented to Situation Alcohol / Substance Use: Not Applicable Psych Involvement: No (comment)  Admission diagnosis:  H/O total hip arthroplasty [Z96.649] Patient Active Problem List   Diagnosis Date Noted  . Gastroesophageal reflux disease with esophagitis   . Screen for colon cancer   . Polyp  of sigmoid colon   . Erythrocytosis 07/29/2019  . 2-vessel coronary artery disease 07/07/2019  . Aortic atherosclerosis ([place]) 07/07/2019  . Cholelithiasis 07/07/2019  . Obesity (BMI 35.0-39.9 without comorbidity) 07/07/2019  . Vitamin B12 deficiency 07/07/2019  . H/O total hip arthroplasty 05/19/2018  . Left lumbar radiculitis  11/09/2017  . DDD (degenerative disc disease), lumbosacral 11/09/2017  . Vitamin D insufficiency 10/12/2017  . 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 Lumbar facet syndrome (Bilateral) (L>R) 10/12/2017  . Anxiety 09/29/2017  . Chronic insomnia 09/29/2017  . Cluster headaches 09/29/2017  . COPD (chronic obstructive pulmonary disease) ([place]) 09/29/2017  . Esophageal varices ([place]) 09/29/2017  . Essential hypertension 09/29/2017  . Sleep apnea 09/29/2017  . Smoker 09/29/2017  . Stricture of esophagus 09/29/2017  . Type 2 diabetes mellitus without complication ([place]) 04/25/1593  . 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 Chronic lower extremity pain [facility] Area of Pain) (Bilateral) (L>R) 09/29/2017  . Chronic hip pain (Primary Area of Pain) (Bilateral) (L>R) 09/29/2017  . Chronic knee pain (Fourth Area of Pain) (Bilateral) (L>R) 09/29/2017  . Chronic pain syndrome 09/29/2017  . Pharmacologic therapy 09/29/2017  . Disorder of skeletal system 09/29/2017  . Problems influencing health status 09/29/2017  . High risk medication use 05/11/2014  . Gastritis and duodenitis 11/07/2013  . Hyperlipidemia, unspecified 11/07/2013  . Variants of migraine, not elsewhere classified, with intractable migraine, so stated, without mention of status migrainosus 11/07/2013   PCP:  Ulyess Blossom, PA Pharmacy:   [facility] [address] [place] [address] Phone: [phone] Fax: [phone]     Social Determinants of Health (SDOH) Interventions    Readmission Risk Interventions No flowsheet data found.

## 2019-11-17 NOTE — Evaluation (Signed)
Occupational Therapy Evaluation Patient Details Name: Johnny Williams MRN: [id number] DOB: Jul 04, 1960 Today's Date: 11/17/2019    History of Present Illness Johnny Williams is a 59yoM who comes to [facility] for Left THA. PMH includes Rt THA (2019), COPD (with inhaler), SOB, sleep apnea, HTN, CAD, chronic insomnia, headaches, GERD, smoker and DM.   Clinical Impression   Johnny Williams presents this morning awake, alert and ready to get out of bed this morning. He is agreeable and frequently uses humor throughout session. He lives in a mobile home with his mother 60 yo, independent and still drives, recent fall and trip to the ED yesterday, per pt report), with 2 stairs for entry, b/l rails, a tub/shower, comfort height toilet and an SPC and 2WW from prior R THA (2019). Prior to surgery, pt was independent in functional mobility, ADL and IADL, only reporting some difficulties with bending over to don/doff footwear. He takes care of the yardwork and cleaning at home, still drives and enjoys hunting and fishing.   Pt completed bed mobility with Min A for LLE mgt, HOB elevated and heavy reliance on LUE and single bed rail. He completed sit to stand t/f from elevated EOB (to imitate home setup) using RW with CGA and demonstrated improved static standing balance with BUE unsupported. Pt ambulated to the bathroom for toileting and completed toilet t/f using RW with CGA. At start of session, pt verbalized 1/3 THP, increasing to 3/3 at end of session. Pt educated on compression stocking mgt, falls risk prevention strategies, safe AE/DME equipment for ADL participation, positioning and posterior THP education. He was agreeable and receptive to information, would benefit from reinforcement; handout provided. Pt will benefit from skilled acute OT services to address decreased independence in ADL, LLE deficits in strength/ROM, safe use of AE/DME and continued posterior THP education to increase pt independence, reduce safety/falls risk and  decrease caregiver burden given home support. Recommend HHOT upon discharge given pt functional status, will continue to monitor.       Follow Up Recommendations  Home health OT    Equipment Recommendations  3 in 1 bedside commode    Recommendations for Other Services       Precautions / Restrictions Precautions Precautions: Posterior Hip;Fall Precaution Booklet Issued: Yes (comment) Restrictions Weight Bearing Restrictions: Yes LLE Weight Bearing: Weight bearing as tolerated      Mobility Bed Mobility Overal bed mobility: Needs Assistance Bed Mobility: Supine to Sit     Supine to sit: Min assist;HOB elevated     General bed mobility comments: required Min A for LLE mgt, attended to hip precautions, relied on one sided rail and heavy UE use to sit up  Transfers Overall transfer level: Needs assistance Equipment used: Rolling walker (2 wheeled) Transfers: Sit to/from Stand Sit to Stand: Min guard;From elevated surface         General transfer comment: raised bed to pt home bed height, stood with [employer] using RW from both EOB and BSC over toilet, increasing independent adherence to hip precautions, demonstrated good UB strength/use    Balance Overall balance assessment: Needs assistance Sitting-balance support: No upper extremity supported;Feet supported Sitting balance-Leahy Scale: Good Sitting balance - Comments: Pt sat EOB without support without unsteadiness. He maintained balance during donning/doffing socks without unsteadiness.   Standing balance support: Bilateral upper extremity supported;During functional activity Standing balance-Leahy Scale: Fair Standing balance comment: Used RW for support during functional mobility and ADL. Pt was able to balance without UE support to reach up  overhead with BUE, no unsteadiness noted.                           ADL either performed or assessed with clinical judgement   ADL Overall ADL's : Needs  assistance/impaired                     Lower Body Dressing: Adhering to hip precautions;With adaptive equipment;Sit to/from stand;Cueing for safety;Minimal assistance Lower Body Dressing Details (indicate cue type and reason): Pt doffed sock using a reacher, needed physical assist for pulling sock over his heel. Per clinical reasoning, would need cueing and Min A using RW and reacher for donning/doffing LB clothing. Toilet Transfer: Min guard;Cueing for sequencing;Cueing for safety;BSC;Regular Toilet;Grab bars;RW;Ambulation(BSC over regular toilet) Toilet Transfer Details (indicate cue type and reason): cues to maintain posterior hip precautions         Functional mobility during ADLs: Min guard;Cueing for safety;Rolling walker General ADL Comments: Pt demonstrated problem solving in completing donning/doffing socks with reacher, although needed some physical assist. Required Mod cueing for maintaining posterior THP throughout, demonstrating increased recall at end of session.     Vision Baseline Vision/History: Wears glasses Wears Glasses: Reading only Vision Assessment?: No apparent visual deficits     Perception     Praxis      Pertinent Vitals/Pain Pain Score: 6 (5 at start of session, 6 following transfer to EOB) Pain Location: L hip Pain Descriptors / Indicators: Grimacing;Guarding;Operative site guarding Pain Intervention(s): Limited activity within patient's tolerance;Monitored during session;Repositioned;Ice applied     Hand Dominance     Extremity/Trunk Assessment Upper Extremity Assessment Upper Extremity Assessment: Overall WFL for tasks assessed   Lower Extremity Assessment Lower Extremity Assessment: LLE deficits/detail LLE Deficits / Details: Anticipated post-op deficits in LLE strength/ROM       Communication Communication Communication: No difficulties   Cognition Arousal/Alertness: Awake/alert Behavior During Therapy: WFL for tasks  assessed/performed Overall Cognitive Status: Within Functional Limits for tasks assessed                                     General Comments       Exercises Other Exercises Other Exercises: Pt educated on compression stocking mgt, falls risk prevention strategies, safe AE/DME equipment for ADL participation, positioning and posterior THP education. He was agreeable and receptive to information, would benefit from reinforcement; handout provided for carryover.   Shoulder Instructions      Home Living Family/patient expects to be discharged to:: Private residence Living Arrangements: Parent(70 yo mother (pt reports she is independent and still drives, recent fall)) Available Help at Discharge: Family Type of Home: Mobile home Home Access: Stairs to enter Entrance Stairs-Number of Steps: 2 Entrance Stairs-Rails: Can reach both Home Layout: One level     Bathroom Shower/Tub: Teacher, early years/pre: Handicapped height     Home Equipment: Environmental consultant - 2 wheels;Cane - single point          Prior Functioning/Environment Level of Independence: Independent        Comments: No RW use since prior surgery, still driving. Pt mows lawn and takes care of house cleaning. Independent in medication mgt. No falls reported in past 12 months.        OT Problem List: Decreased strength;Decreased range of motion;Decreased activity tolerance;Impaired balance (sitting and/or standing);Decreased knowledge of precautions;Decreased knowledge of use  of DME or AE;Pain      OT Treatment/Interventions: Self-care/ADL training;Therapeutic exercise;Therapeutic activities;Energy conservation;DME and/or AE instruction;Patient/family education    OT Goals(Current goals can be found in the care plan section) Acute Rehab OT Goals Patient Stated Goal: Return to PLOF in independence OT Goal Formulation: With patient Time For Goal Achievement: 12/01/19 Potential to Achieve Goals:  Fair ADL Goals Pt Will Perform Lower Body Dressing: with modified independence;sit to/from stand;with adaptive equipment(using LRAD and maintaining posterior THP) Pt Will Transfer to Toilet: with modified independence;ambulating;bedside commode;regular height toilet(BSC over toilet; LRAD; maintaining posterior THP) Additional ADL Goal #1: Pt will independently demonstrate 3/3 posterior THP during functional activity to maximize independence and recovery  OT Frequency: Min 2X/week   Barriers to D/C: Decreased caregiver support  Pt's mother had a fall a few days ago and had to go to the hospital; pt says she went home last night       Co-evaluation              AM-PAC OT "6 Clicks" Daily Activity     Outcome Measure Help from another person eating meals?: None Help from another person taking care of personal grooming?: None Help from another person toileting, which includes using toliet, bedpan, or urinal?: A Little Help from another person bathing (including washing, rinsing, drying)?: A Lot Help from another person to put on and taking off regular upper body clothing?: None Help from another person to put on and taking off regular lower body clothing?: A Little 6 Click Score: 20   End of Session Equipment Utilized During Treatment: Gait belt;Rolling walker  Activity Tolerance: Patient tolerated treatment well Patient left: in chair;with call bell/phone within reach;with chair alarm set  OT Visit Diagnosis: Other abnormalities of gait and mobility (R26.89);Pain Pain - Right/Left: Left Pain - part of body: Hip                Time: [id number] OT Time Calculation (min): 57 min Charges:  OT General Charges $OT Visit: 1 Visit OT Evaluation $OT Eval Moderate Complexity: 1 Mod OT Treatments $Self Care/Home Management : 38-52 mins  Johnny Williams, OTS 11/17/19, 10:44 AM

## 2019-11-17 NOTE — Progress Notes (Signed)
Physical Therapy Treatment Patient Details Name: Johnny Williams MRN: [id number] DOB: [birth year] Today's Date: 11/17/2019    History of Present Illness Johnny Williams is a 1yoM who comes to [facility] for Left THA. PMH includes Rt THA (2019), COPD (with inhaler), SOB, sleep apnea, HTN, CAD, chronic insomnia, headaches, GERD, smoker and DM.    PT Comments    Pt was seate din recliner upon arriving. He reports he ambulate a lap with RN staff since previous session in AM. Pt agrees to 2nd session and is cooperative throughout. Pt stood and ambulated 300 ft with supervision only. Demonstrated safe ability to ascend/descend 4 stair with BUE support with supervision only. Pt returned to room and requested to get back into bed to take a rest. Required mod assist to progress LEs into bed. Unable to perform I'ly. Pt performed then therapist discussed importance of performing exercises throughout the day to promote strengthening. He states understanding. He was in bed with bed alarm in place, call bell in reach and RN aware of abilities at conclusion of session. Follow surgeons recommendations for DC disposition. Pt is progressing well overall.     Follow Up Recommendations  Follow surgeon's recommendation for DC plan and follow-up therapies;Other (comment)(pt struggles only with getting in/out of bed)     Equipment Recommendations  None recommended by PT    Recommendations for Other Services       Precautions / Restrictions Precautions Precautions: Posterior Hip;Fall Precaution Booklet Issued: Yes (comment) Restrictions Weight Bearing Restrictions: Yes LLE Weight Bearing: Weight bearing as tolerated    Mobility  Bed Mobility Overal bed mobility: Needs Assistance Bed Mobility: Sit to Supine     Supine to sit: Mod assist     General bed mobility comments: pt required mod assist to sit to supine. unable to progress BLes into bed without mod assist.  Transfers Overall transfer level: Needs  assistance Equipment used: Rolling walker (2 wheeled) Transfers: Sit to/from Stand Sit to Stand: Supervision         General transfer comment: Pt demonstrated safe ability to STS from recliner without physical assistance. did require vcs for hand placement and to adhere to precautions.  Ambulation/Gait Ambulation/Gait assistance: Supervision Gait Distance (Feet): 300 Feet Assistive device: Rolling walker (2 wheeled) Gait Pattern/deviations: WFL(Within Functional Limits) Gait velocity: WNL   General Gait Details: pt demonstrates safe gait kinematics   Stairs Stairs: Yes Stairs assistance: Supervision Stair Management: Two rails Number of Stairs: 4 General stair comments: Pt was able to safely ascend/descend stairs without assistance. demonstarted correct sequencing and reports confidence in abilities to perform    Wheelchair Mobility    Modified Rankin (Stroke Patients Only)       Balance Overall balance assessment: Modified Independent         Standing balance support: Bilateral upper extremity supported;During functional activity Standing balance-Leahy Scale: Normal                              Cognition Arousal/Alertness: Awake/alert Behavior During Therapy: WFL for tasks assessed/performed Overall Cognitive Status: Within Functional Limits for tasks assessed                                 General Comments: Pt is A and O x 4      Exercises Total Joint Exercises Ankle Circles/Pumps: AROM;20 reps;Both;Supine Quad Sets: AROM;10 reps Gluteal Sets: AROM;Left;10 reps;Supine Heel  Slides: AROM;10 reps Straight Leg Raises: AAROM;5 reps    General Comments        Pertinent Vitals/Pain Pain Assessment: No/denies pain Pain Score: 0-No pain Pain Location: L hip Pain Descriptors / Indicators: Sore;Tightness Pain Intervention(s): Limited activity within patient's tolerance;Monitored during session;Premedicated before session     Home Living                      Prior Function            PT Goals (current goals can now be found in the care plan section) Acute Rehab PT Goals Patient Stated Goal: Return to PLOF in independence Progress towards PT goals: Progressing toward goals    Frequency    BID      PT Plan Current plan remains appropriate    Co-evaluation              AM-PAC PT "6 Clicks" Mobility   Outcome Measure  Help needed turning from your back to your side while in a flat bed without using bedrails?: A Lot Help needed moving from lying on your back to sitting on the side of a flat bed without using bedrails?: A Lot Help needed moving to and from a bed to a chair (including a wheelchair)?: A Lot Help needed standing up from a chair using your arms (e.g., wheelchair or bedside chair)?: None Help needed to walk in hospital room?: None Help needed climbing 3-5 steps with a railing? : None 6 Click Score: 18    End of Session Equipment Utilized During Treatment: Gait belt Activity Tolerance: Patient tolerated treatment well;No increased pain Patient left: in bed;with bed alarm set;with nursing/sitter in room;with SCD's reapplied;with call bell/phone within reach Nurse Communication: Mobility status PT Visit Diagnosis: Difficulty in walking, not elsewhere classified (R26.2);Other abnormalities of gait and mobility (R26.89);Muscle weakness (generalized) (M62.81)     Time: [id number] PT Time Calculation (min) (ACUTE ONLY): 23 min  Charges:  $Gait Training: 8-22 mins $Therapeutic Exercise: 8-22 mins                     Julaine Fusi PTA 11/17/19, 4:48 PM

## 2019-11-17 NOTE — Plan of Care (Signed)
Problem: Education: Goal: Knowledge of General Education information will improve Description: Including pain rating scale, medication(s)/side effects and non-pharmacologic comfort measures 11/17/2019 0009 by Noah Delaine, RN Outcome: Progressing 11/17/2019 0009 by Noah Delaine, RN Outcome: Progressing   Problem: Health Behavior/Discharge Planning: Goal: Ability to manage health-related needs will improve 11/17/2019 0009 by Noah Delaine, RN Outcome: Progressing 11/17/2019 0009 by Noah Delaine, RN Outcome: Progressing   Problem: Clinical Measurements: Goal: Ability to maintain clinical measurements within normal limits will improve 11/17/2019 0009 by Noah Delaine, RN Outcome: Progressing 11/17/2019 0009 by Noah Delaine, RN Outcome: Progressing Goal: Will remain free from infection 11/17/2019 0009 by Noah Delaine, RN Outcome: Progressing 11/17/2019 0009 by Noah Delaine, RN Outcome: Progressing Goal: Diagnostic test results will improve 11/17/2019 0009 by Noah Delaine, RN Outcome: Progressing 11/17/2019 0009 by Noah Delaine, RN Outcome: Progressing Goal: Respiratory complications will improve 11/17/2019 0009 by Noah Delaine, RN Outcome: Progressing 11/17/2019 0009 by Noah Delaine, RN Outcome: Progressing Goal: Cardiovascular complication will be avoided 11/17/2019 0009 by Noah Delaine, RN Outcome: Progressing 11/17/2019 0009 by Noah Delaine, RN Outcome: Progressing   Problem: Activity: Goal: Risk for activity intolerance will decrease 11/17/2019 0009 by Noah Delaine, RN Outcome: Progressing 11/17/2019 0009 by Noah Delaine, RN Outcome: Progressing   Problem: Nutrition: Goal: Adequate nutrition will be maintained 11/17/2019 0009 by Noah Delaine, RN Outcome: Progressing 11/17/2019 0009 by Noah Delaine, RN Outcome: Progressing   Problem: Coping: Goal: Level of anxiety will decrease 11/17/2019  0009 by Noah Delaine, RN Outcome: Progressing 11/17/2019 0009 by Noah Delaine, RN Outcome: Progressing   Problem: Elimination: Goal: Will not experience complications related to bowel motility 11/17/2019 0009 by Noah Delaine, RN Outcome: Progressing 11/17/2019 0009 by Noah Delaine, RN Outcome: Progressing Goal: Will not experience complications related to urinary retention 11/17/2019 0009 by Noah Delaine, RN Outcome: Progressing 11/17/2019 0009 by Noah Delaine, RN Outcome: Progressing   Problem: Pain Managment: Goal: General experience of comfort will improve 11/17/2019 0009 by Noah Delaine, RN Outcome: Progressing 11/17/2019 0009 by Noah Delaine, RN Outcome: Progressing   Problem: Safety: Goal: Ability to remain free from injury will improve 11/17/2019 0009 by Noah Delaine, RN Outcome: Progressing 11/17/2019 0009 by Noah Delaine, RN Outcome: Progressing   Problem: Skin Integrity: Goal: Risk for impaired skin integrity will decrease 11/17/2019 0009 by Noah Delaine, RN Outcome: Progressing 11/17/2019 0009 by Noah Delaine, RN Outcome: Progressing   Problem: Education: Goal: Knowledge of the prescribed therapeutic regimen will improve 11/17/2019 0009 by Noah Delaine, RN Outcome: Progressing 11/17/2019 0009 by Noah Delaine, RN Outcome: Progressing Goal: Understanding of discharge needs will improve 11/17/2019 0009 by Noah Delaine, RN Outcome: Progressing 11/17/2019 0009 by Noah Delaine, RN Outcome: Progressing Goal: Individualized Educational Video(s) 11/17/2019 0009 by Noah Delaine, RN Outcome: Progressing 11/17/2019 0009 by Noah Delaine, RN Outcome: Progressing   Problem: Activity: Goal: Ability to avoid complications of mobility impairment will improve 11/17/2019 0009 by Noah Delaine, RN Outcome: Progressing 11/17/2019 0009 by Noah Delaine, RN Outcome: Progressing Goal: Ability  to tolerate increased activity will improve 11/17/2019 0009 by Noah Delaine, RN Outcome: Progressing 11/17/2019 0009 by Noah Delaine, RN Outcome: Progressing   Problem: Clinical Measurements: Goal: Postoperative complications will be avoided or minimized 11/17/2019 0009 by Noah Delaine, RN Outcome: Progressing 11/17/2019 0009 by Noah Delaine, RN Outcome: Progressing   Problem: Pain Management: Goal: Pain level will decrease with appropriate interventions 11/17/2019 0009 by Noah Delaine, RN Outcome: Progressing 11/17/2019 0009 by Noah Delaine, RN Outcome: Progressing   Problem: Skin Integrity: Goal: Will show signs  of wound healing 11/17/2019 0009 by Noah Delaine, RN Outcome: Progressing 11/17/2019 0009 by Noah Delaine, RN Outcome: Progressing

## 2019-11-17 NOTE — Progress Notes (Signed)
Patient voided without difficulty into toilet

## 2019-11-17 NOTE — Anesthesia Postprocedure Evaluation (Signed)
Anesthesia Post Note  Patient: Johnny Williams  Procedure(s) Performed: TOTAL HIP ARTHROPLASTY (Left Hip)  Patient location during evaluation: Nursing Unit Anesthesia Type: Spinal Level of consciousness: awake, awake and alert and oriented Pain management: pain level controlled Vital Signs Assessment: post-procedure vital signs reviewed and stable Respiratory status: spontaneous breathing, nonlabored ventilation and respiratory function stable Cardiovascular status: stable Postop Assessment: no headache, no backache, patient able to bend at knees, no apparent nausea or vomiting and adequate PO intake Anesthetic complications: no     Last Vitals:  Vitals:   11/17/19 0401 11/17/19 0736  BP: 120/68 136/75  Pulse: 85 67  Resp: 18 18  Temp: 36.5 C 36.4 C  SpO2: 92% 94%    Last Pain:  Vitals:   11/17/19 0736  TempSrc: Oral  PainSc:                  Ricki Miller

## 2019-11-17 NOTE — Progress Notes (Signed)
  Subjective: 1 Day Post-Op Procedure(s) (LRB): TOTAL HIP ARTHROPLASTY (Left) Patient reports pain as well-controlled.   Patient is having problems with urinary retention. Patient has had 2 in and out caths since surgery. Plan is to go Home after hospital stay if possible, but may be discharge to SNF. Negative for chest pain and shortness of breath Fever: no Gastrointestinal: negative for nausea and vomiting.   Patient has not had a bowel movement.  Objective: Vital signs in last 24 hours: Temp:  [97.2 F (36.2 C)-98.1 F (36.7 C)] 97.6 F (36.4 C) (05/27 0736) Pulse Rate:  [49-90] 67 (05/27 0736) Resp:  [13-19] 18 (05/27 0736) BP: (111-149)/(62-82) 136/75 (05/27 0736) SpO2:  [90 %-100 %] 94 % (05/27 0736)  Intake/Output from previous day:  Intake/Output Summary (Last 24 hours) at 11/17/2019 0936 Last data filed at 11/17/2019 0800 Gross per 24 hour  Intake 4237.96 ml  Output 6130 ml  Net -1892.04 ml    Intake/Output this shift: Total I/O In: -  Out: 1000 [Urine:1000]  Labs: No results for input(s): HGB in the last 72 hours. No results for input(s): WBC, RBC, HCT, PLT in the last 72 hours. No results for input(s): NA, K, CL, CO2, BUN, CREATININE, GLUCOSE, CALCIUM in the last 72 hours. No results for input(s): LABPT, INR in the last 72 hours.   EXAM General - Patient is Alert, Appropriate and Oriented Extremity - Neurovascular intact Dorsiflexion/Plantar flexion intact Compartment soft Dressing/Incision -clean, dry, no drainage, Hemovac in place.  Motor Function - intact, moving foot and toes well on exam.  Cardiovascular- Regular rate and rhythm, no murmurs/rubs/gallops Respiratory- Expiratory wheezing heard bilaterally, no crackles  Gastrointestinal- soft, tender to palpation over the suprapubic area, and active bowel sounds   Assessment/Plan: 1 Day Post-Op Procedure(s) (LRB): TOTAL HIP ARTHROPLASTY (Left) Active Problems:   H/O total hip  arthroplasty  Estimated body mass index is 39.05 kg/m as calculated from the following:   Height as of this encounter: 6\' 1"  (1.854 m).   Weight as of this encounter: 134.3 kg. Advance diet Up with therapy Plan for discharge tomorrow Start Flomax .4mg  qd.   DVT Prophylaxis - Lovenox, Ted hose and foot pumps Weight-Bearing as tolerated to left leg  Cassell Smiles, PA-C [facility] Orthopaedic Surgery 11/17/2019, 9:36 AM

## 2019-11-17 NOTE — Progress Notes (Addendum)
Physical Therapy Treatment Patient Details Name: Johnny Williams MRN: [id number] DOB: 06-01-1961 Today's Date: 11/17/2019    History of Present Illness Johnny Williams is a 40yoM who comes to [facility] for Left THA. PMH includes Rt THA (2019), COPD (with inhaler), SOB, sleep apnea, HTN, CAD, chronic insomnia, headaches, GERD, smoker and DM.    PT Comments    Pt was seated in recliner upon arriving and is cooperative throughout. Pt is A and O x 4 and agreebale to PT session. He needed vcs throughout for adhering to hip precautions. Overall pt demonstrated much improved abilities form previous date observed. Will trial stairs in PM session and see how pt does with getting in/out of bed. He was seated in recliner at conclusion of session with chair alarm in place and call bell in reach. Demonstrated safe ability to perform there ex handout without assistance.       Follow Up Recommendations  SNF;Supervision for mobility/OOB;Follow surgeon's recommendation for DC plan and follow-up therapies     Equipment Recommendations  None recommended by PT    Recommendations for Other Services       Precautions / Restrictions Precautions Precautions: Posterior Hip;Fall Precaution Booklet Issued: Yes (comment) Restrictions Weight Bearing Restrictions: Yes LLE Weight Bearing: Weight bearing as tolerated    Mobility  Bed Mobility               General bed mobility comments: seated in recliner pre/post session  Transfers Overall transfer level: Needs assistance Equipment used: Rolling walker (2 wheeled) Transfers: Sit to/from Stand Sit to Stand: Supervision         General transfer comment: Pt demonstrated safe ability to STS from recliner without physical assistance. did require vcs for hand placement and to adhere to precautions.  Ambulation/Gait Ambulation/Gait assistance: Supervision Gait Distance (Feet): 300 Feet Assistive device: Rolling walker (2 wheeled) Gait Pattern/deviations:  Step-through pattern Gait velocity: WNL   General Gait Details: no LOB or unsteadiness with ambulation 2 laps around RN station   Stairs             Wheelchair Mobility    Modified Rankin (Stroke Patients Only)       Balance Overall balance assessment: Modified Independent         Standing balance support: Bilateral upper extremity supported;During functional activity Standing balance-Leahy Scale: Normal                              Cognition Arousal/Alertness: Awake/alert Behavior During Therapy: WFL for tasks assessed/performed Overall Cognitive Status: Within Functional Limits for tasks assessed                                 General Comments: Pt is A and O x 4      Exercises Total Joint Exercises Ankle Circles/Pumps: AROM;20 reps;Both;Supine Gluteal Sets: AROM;Left;10 reps;Supine    General Comments        Pertinent Vitals/Pain Pain Location: L hip Pain Descriptors / Indicators: Discomfort;Sore;Heaviness    Home Living                      Prior Function            PT Goals (current goals can now be found in the care plan section) Acute Rehab PT Goals Patient Stated Goal: Return to PLOF in independence    Frequency  BID      PT Plan Current plan remains appropriate    Co-evaluation              AM-PAC PT "6 Clicks" Mobility   Outcome Measure  Help needed turning from your back to your side while in a flat bed without using bedrails?: A Lot Help needed moving from lying on your back to sitting on the side of a flat bed without using bedrails?: A Lot Help needed moving to and from a bed to a chair (including a wheelchair)?: A Little Help needed standing up from a chair using your arms (e.g., wheelchair or bedside chair)?: A Little Help needed to walk in hospital room?: A Little Help needed climbing 3-5 steps with a railing? : A Little 6 Click Score: 16    End of Session Equipment  Utilized During Treatment: Gait belt Activity Tolerance: Patient tolerated treatment well;No increased pain Patient left: in chair;with chair alarm set;with call bell/phone within reach;with SCD's reapplied Nurse Communication: Mobility status PT Visit Diagnosis: Difficulty in walking, not elsewhere classified (R26.2);Other abnormalities of gait and mobility (R26.89);Muscle weakness (generalized) (M62.81)     Time:  -     Charges:                       Julaine Fusi PTA 11/17/19, 4:37 PM

## 2019-11-18 LAB — SURGICAL PATHOLOGY

## 2019-11-18 MED ORDER — TAMSULOSIN HCL 0.4 MG PO CAPS: 0.4000 mg | ORAL_CAPSULE | Freq: Every day | ORAL | 1 refills | Status: AC

## 2019-11-18 MED ORDER — TRAMADOL HCL 50 MG PO TABS: 50.0000 mg | ORAL_TABLET | ORAL | 0 refills | Status: AC | PRN

## 2019-11-18 MED ORDER — OXYCODONE HCL 5 MG PO TABS: 5.0000 mg | ORAL_TABLET | ORAL | 0 refills | Status: AC | PRN

## 2019-11-18 MED ORDER — ENOXAPARIN SODIUM 40 MG/0.4ML ~~LOC~~ SOLN: 40.0000 mg | SUBCUTANEOUS | 0 refills | Status: AC

## 2019-11-18 NOTE — TOC Transition Note (Signed)
Transition of Care [facility]) - CM/SW Discharge Note   Patient Details  Name: Johnny Williams MRN: [id number] Date of Birth: 06/01/1961  [facility] CM/SW Contact:  Su Hilt, RN Phone Number: 11/18/2019, 2:05 PM   Clinical Narrative:    The patient is to discharge home today with Home health services set up with [facility], The patient is provided with a 3 in 1 to take home by [employer]. Family to transport   Final next level of care: [place] Barriers to Discharge: Continued Medical Work up   Patient Goals and CMS Choice        Discharge Placement                       Discharge Plan and Services   Discharge Planning Services: CM Consult            DME Arranged: 3-N-1 DME Agency: [employer] Date DME Agency Contacted: 11/17/19 Time DME Agency Contacted: 61 Representative spoke with at DME Agency: [place]: PT [place]: [facility] (formerly [employer]) Date [place]: 11/17/19 Time [place]: 25 Representative spoke with at [place]: [place] ([place]) Interventions     Readmission Risk Interventions No flowsheet data found.

## 2019-11-18 NOTE — Progress Notes (Signed)
Physical Therapy Treatment Patient Details Name: Johnny Williams MRN: [id number] DOB: 1961-05-21 Today's Date: 11/18/2019    History of Present Illness Johnny Williams is a 45yoM who comes to [facility] for Left THA. PMH includes Rt THA (2019), COPD (with inhaler), SOB, sleep apnea, HTN, CAD, chronic insomnia, headaches, GERD, smoker and DM.    PT Comments    Pt was seated EOB finishing breakfast upon arriving. He agrees to PT session and reports no pain. " its a little tight/sore but no pain." Pt demonstrated much improved functional strength and was able to exit/re-enter bed without physical assistance. Did require vcs for adhering to hip precautions throughout. Pt ambulated 400 ft with RW without difficulty. Minimal vcs for posture correction and shoulder decompression. Overall pt is progressing well with PT. He will benefit from bariatric 3 in 1 2/2 to body mass girth. At conclusion of PT session, pt was seated in recliner with chair alarm in place and RN in room. Pt will benefit from continued skilled PT to continue progression of strength and safe functional mobility.     Follow Up Recommendations  Follow surgeon's recommendation for DC plan and follow-up therapies;Other (comment)(pt able to perform bed mobility without physical assistance )     Equipment Recommendations  3in1 (PT);Other (comment)(bariatric due to body mass girth)    Recommendations for Other Services       Precautions / Restrictions Precautions Precautions: Posterior Hip;Fall Precaution Booklet Issued: Yes (comment) Restrictions Weight Bearing Restrictions: Yes LLE Weight Bearing: Weight bearing as tolerated    Mobility  Bed Mobility Overal bed mobility: Modified Independent Bed Mobility: Supine to Sit;Sit to Supine     Supine to sit: Modified independent (Device/Increase time) Sit to supine: Modified independent (Device/Increase time)   General bed mobility comments: Pt demonstrates improved LE strength this date and  was able to exit/re-enter bed without physical assistance. did require vcs for adhering to hip precautions  Transfers Overall transfer level: Needs assistance Equipment used: Rolling walker (2 wheeled) Transfers: Sit to/from Stand Sit to Stand: Modified independent (Device/Increase time)            Ambulation/Gait Ambulation/Gait assistance: Modified independent (Device/Increase time) Gait Distance (Feet): 400 Feet Assistive device: Rolling walker (2 wheeled) Gait Pattern/deviations: Step-through pattern Gait velocity: WNL   General Gait Details: pt demonstrates safe gait kinematics   Stairs             Wheelchair Mobility    Modified Rankin (Stroke Patients Only)       Balance Overall balance assessment: Modified Independent Sitting-balance support: No upper extremity supported;Feet supported Sitting balance-Leahy Scale: Good     Standing balance support: Bilateral upper extremity supported;During functional activity Standing balance-Leahy Scale: Good                              Cognition Arousal/Alertness: Awake/alert Behavior During Therapy: WFL for tasks assessed/performed Overall Cognitive Status: Within Functional Limits for tasks assessed                                 General Comments: Pt is A and O x 4      Exercises      General Comments        Pertinent Vitals/Pain Pain Assessment: No/denies pain Pain Score: 0-No pain Pain Location: L hip Pain Descriptors / Indicators: Sore Pain Intervention(s): Limited activity within patient's tolerance;Monitored during  session;Premedicated before session;Ice applied    Home Living                      Prior Function            PT Goals (current goals can now be found in the care plan section) Acute Rehab PT Goals Patient Stated Goal: Return to PLOF in independence Progress towards PT goals: Progressing toward goals    Frequency    BID      PT  Plan Current plan remains appropriate    Co-evaluation              AM-PAC PT "6 Clicks" Mobility   Outcome Measure  Help needed turning from your back to your side while in a flat bed without using bedrails?: None Help needed moving from lying on your back to sitting on the side of a flat bed without using bedrails?: None Help needed moving to and from a bed to a chair (including a wheelchair)?: None Help needed standing up from a chair using your arms (e.g., wheelchair or bedside chair)?: None Help needed to walk in hospital room?: None Help needed climbing 3-5 steps with a railing? : None 6 Click Score: 24    End of Session Equipment Utilized During Treatment: Gait belt Activity Tolerance: Patient tolerated treatment well;No increased pain Patient left: in chair;with call bell/phone within reach;with chair alarm set;with nursing/sitter in room Nurse Communication: Mobility status PT Visit Diagnosis: Difficulty in walking, not elsewhere classified (R26.2);Other abnormalities of gait and mobility (R26.89);Muscle weakness (generalized) (M62.81)     Time: [id number] PT Time Calculation (min) (ACUTE ONLY): 20 min  Charges:  $Gait Training: 8-22 mins                     Julaine Fusi PTA 11/18/19, 8:52 AM

## 2019-11-18 NOTE — Discharge Summary (Addendum)
Physician Discharge Summary  Patient ID: Kyan Repke MRN: [id number] DOB/AGE: May 06, 1961 59 y.o.  Admit date: 11/16/2019 Discharge date: 11/18/2019  Admission Diagnoses:  H/O total hip arthroplasty [Z96.649]  Surgeries:Procedure(s):  Left total hip arthroplasty  SURGEON:  Marciano Sequin. M.D.  ASSISTANT: Cassell Smiles, PA-C (present and scrubbed throughout the case, critical for assistance with exposure, retraction, instrumentation, and closure)  ANESTHESIA: spinal  ESTIMATED BLOOD LOSS: 400 mL  FLUIDS REPLACED: 1200 mL of crystalloid  DRAINS: 2 medium drains to a Hemovac reservoir  IMPLANTS UTILIZED: DePuy 15 mm large stature AML femoral stem, 58 mm OD Pinnacle 100 acetabular component, +4 mm neutral Pinnacle Marathon polyethylene insert, and a 36 mm M-SPEC +1.5 mm hip ball  Discharge Diagnoses: Patient Active Problem List   Diagnosis Date Noted  . Gastroesophageal reflux disease with esophagitis   . Screen for colon cancer   . Polyp of sigmoid colon   . Erythrocytosis 07/29/2019  . 2-vessel coronary artery disease 07/07/2019  . Aortic atherosclerosis ([place]) 07/07/2019  . Cholelithiasis 07/07/2019  . Obesity (BMI 35.0-39.9 without comorbidity) 07/07/2019  . Vitamin B12 deficiency 07/07/2019  . H/O total hip arthroplasty 05/19/2018  . Left lumbar radiculitis 11/09/2017  . DDD (degenerative disc disease), lumbosacral 11/09/2017  . Vitamin D insufficiency 10/12/2017  . 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 Lumbar facet syndrome (Bilateral) (L>R) 10/12/2017  . Anxiety 09/29/2017  . Chronic insomnia 09/29/2017  . Cluster headaches 09/29/2017  . COPD (chronic obstructive pulmonary disease) ([place]) 09/29/2017  . Esophageal varices ([place]) 09/29/2017  . Essential hypertension 09/29/2017  . Sleep apnea 09/29/2017  . Smoker 09/29/2017  . Stricture of esophagus 09/29/2017  . Type 2 diabetes mellitus without complication ([place]) [id number]  . 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 Chronic lower extremity pain [facility] Area of Pain) (Bilateral) (L>R) 09/29/2017  . Chronic hip pain (Primary Area of Pain) (Bilateral) (L>R) 09/29/2017  . Chronic knee pain (Fourth Area of Pain) (Bilateral) (L>R) 09/29/2017  . Chronic pain syndrome 09/29/2017  . Pharmacologic therapy 09/29/2017  . Disorder of skeletal system 09/29/2017  . Problems influencing health status 09/29/2017  . High risk medication use 05/11/2014  . Gastritis and duodenitis 11/07/2013  . Hyperlipidemia, unspecified 11/07/2013  . Variants of migraine, not elsewhere classified, with intractable migraine, so stated, without mention of status migrainosus 11/07/2013    Past Medical History:  Diagnosis Date  . Anxiety   . Arthritis   . Chronic insomnia   . COPD (chronic obstructive pulmonary disease) ([place])   . Dyspnea   . Esophageal stricture   . Esophageal varices ([place])   . GERD (gastroesophageal reflux disease)   . Hyperlipidemia   . Hypertension   . Joint ache   . Pre-diabetes   . Reflux esophagitis   . Sleep apnea      Transfusion:    Consultants (if any):   Discharged Condition: Improved  Hospital Course: Nobert Hammons is an 59 y.o. male who was admitted 11/16/2019 with a diagnosis of left hip osteoarthritis and went to the operating room on 11/16/2019 and underwent left total hip arthroplasty through posterior approach. The patient received perioperative antibiotics for prophylaxis (see below). The patient tolerated the procedure well and was transported to PACU in stable condition. After meeting PACU criteria, the patient was subsequently transferred to the Orthopaedics/Rehabilitation unit.   The patient received DVT prophylaxis in the form of early mobilization, Lovenox, Foot Pumps and TED hose. A  sacral pad had been placed and heels were elevated off of the bed with rolled towels in order to protect skin integrity. Foley  catheter was discontinued on postoperative day #0. Wound drains were discontinued on postoperative day #2. The surgical incision was healing well without signs of infection.  Physical therapy was initiated postoperatively for transfers, gait training, and strengthening. Occupational therapy was initiated for activities of daily living and evaluation for assisted devices. Rehabilitation goals were reviewed in detail with the patient. The patient made steady progress with physical therapy and physical therapy recommended discharge to Home.   The patient achieved his preliminary goals of this hospitalization and was felt to be medically and orthopaedically appropriate for discharge.  He was given perioperative antibiotics:  Anti-infectives (From admission, onward)   Start     Dose/Rate Route Frequency Ordered Stop   11/16/19 1233  ceFAZolin (ANCEF) IVPB 2g/100 mL premix     2 g 200 mL/hr over 30 Minutes Intravenous Every 6 hours 11/16/19 1233 11/17/19 0759   11/16/19 0651  ceFAZolin (ANCEF) 2-4 GM/100ML-% IVPB    Note to Pharmacy: Register, Karen   : cabinet override      11/16/19 0651 11/16/19 0739   11/16/19 0600  ceFAZolin (ANCEF) IVPB 2g/100 mL premix     2 g 200 mL/hr over 30 Minutes Intravenous On call to O.R. 11/16/19 [id number] 11/16/19 0746    .  Recent vital signs:  Vitals:   11/17/19 2353 11/18/19 0822  BP: 117/64 118/73  Pulse: 65 70  Resp: 18 18  Temp: (!) 97.5 F (36.4 C) 97.6 F (36.4 C)  SpO2: 94% 97%    Recent laboratory studies:  No results for input(s): WBC, HGB, HCT, PLT, K, CL, CO2, BUN, CREATININE, GLUCOSE, CALCIUM, LABPT, INR in the last 72 hours.  Diagnostic Studies: DG Hip Port Unilat With Pelvis 1V Left  Result Date: 11/16/2019 CLINICAL DATA:  Left hip arthroplasty. EXAM: DG HIP (WITH OR WITHOUT PELVIS) 1V PORT LEFT COMPARISON:  05/19/2018 FINDINGS: Interval left total hip arthroplasty. No periprosthetic fracture or dislocation. Hardware components are in  anatomic alignment. Previous right hip arthroplasty device, stable. IMPRESSION: Status post left hip arthroplasty. Electronically Signed   By: Kerby Moors M.D.   On: 11/16/2019 12:00    Discharge Medications:   Allergies as of 11/18/2019      Reactions   Gabapentin    Migraines    Pregabalin    Blood in stool    Fluoxetine    Insomnia      Medication List    STOP taking these medications   ARTHRITIS STRENGTH BC POWDER PO   aspirin 81 MG EC tablet     TAKE these medications   acetaminophen 650 MG CR tablet Commonly known as: TYLENOL Take 1,300 mg by mouth 3 (three) times daily.   Allergy Relief 25 mg capsule Generic drug: diphenhydrAMINE Take 50 mg by mouth daily as needed (ALLERGY RELIEF).   atorvastatin 80 MG tablet Commonly known as: LIPITOR Take 80 mg by mouth daily.   calcium carbonate 750 MG chewable tablet Commonly known as: TUMS EX Chew 2 tablets by mouth daily.   celecoxib 200 MG capsule Commonly known as: CELEBREX Take 1 capsule (200 mg total) by mouth 2 (two) times daily.   enoxaparin 40 MG/0.4ML injection Commonly known as: LOVENOX Inject 0.4 mLs (40 mg total) into the skin daily for 14 days.   hydrochlorothiazide 25 MG tablet Commonly known as: HYDRODIURIL Take 25 mg by mouth daily.  magnesium oxide 400 MG tablet Commonly known as: MAG-OX Take 800 mg by mouth daily.   omeprazole 20 MG capsule Commonly known as: PRILOSEC Take 20 mg by mouth daily.   ONE-A-DAY 50 PLUS PO Take 1 tablet by mouth daily.   oxyCODONE 5 MG immediate release tablet Commonly known as: Oxy IR/ROXICODONE Take 1 tablet (5 mg total) by mouth every 4 (four) hours as needed for moderate pain (pain score 4-6).   tamsulosin 0.4 MG Caps capsule Commonly known as: FLOMAX Take 1 capsule (0.4 mg total) by mouth daily. Start taking on: Nov 19, 2019   traMADol 50 MG tablet Commonly known as: ULTRAM Take 1 tablet (50 mg total) by mouth every 4 (four) hours as needed for  moderate pain.   verapamil 240 MG 24 hr capsule Commonly known as: VERELAN PM Take 240 mg by mouth daily.   vitamin B-12 1000 MCG tablet Commonly known as: CYANOCOBALAMIN Take 1,000 mcg by mouth daily.   Vitamin C 500 MG Caps Take 1,000 mg by mouth daily.   Vitamin D3 125 MCG (5000 UT) Caps Take 5,000 Units by mouth daily.            Durable Medical Equipment  (From admission, onward)         Start     Ordered   11/17/19 1637  DME Bedside commode  Once    Comments: Due to Patient Habitus the patient needs a Bariatric 3 in 1  Question:  Patient needs a bedside commode to treat with the following condition  Answer:  S/P total hip arthroplasty   11/17/19 1637   11/16/19 1234  DME Walker rolling  Once    Question:  Patient needs a walker to treat with the following condition  Answer:  S/P total hip arthroplasty   11/16/19 1233          Disposition: Discharge disposition: 01-Home or Self Care     home with home health PT    Follow-up Information    Dereck Leep, MD On 12/29/2019.   Specialty: Orthopedic Surgery Why: at 2:30pm Contact information: [place] [address] [id number] [place], PA-C 11/18/2019, 12:41 PM

## 2019-11-18 NOTE — Progress Notes (Addendum)
  Subjective: 2 Days Post-Op Procedure(s) (LRB): TOTAL HIP ARTHROPLASTY (Left) Patient reports pain as well-controlled.   Patient is well, and has had no acute complaints or problems Plan is to go Home after hospital stay. Negative for chest pain and shortness of breath Fever: no Gastrointestinal: negative for nausea and vomiting.   Patient has had a bowel movement.  Objective: Vital signs in last 24 hours: Temp:  [97.5 F (36.4 C)-97.6 F (36.4 C)] 97.6 F (36.4 C) (05/28 0822) Pulse Rate:  [65-78] 70 (05/28 0822) Resp:  [16-18] 18 (05/28 0822) BP: (117-118)/(64-73) 118/73 (05/28 0822) SpO2:  [93 %-97 %] 97 % (05/28 0822)  Intake/Output from previous day:  Intake/Output Summary (Last 24 hours) at 11/18/2019 1208 Last data filed at 11/18/2019 1031 Gross per 24 hour  Intake 1056.25 ml  Output 326 ml  Net 730.25 ml    Intake/Output this shift: Total I/O In: 120 [P.O.:120] Out: -   Labs: No results for input(s): HGB in the last 72 hours. No results for input(s): WBC, RBC, HCT, PLT in the last 72 hours. No results for input(s): NA, K, CL, CO2, BUN, CREATININE, GLUCOSE, CALCIUM in the last 72 hours. No results for input(s): LABPT, INR in the last 72 hours.   EXAM General - Patient is Alert, Appropriate and Oriented Extremity - Neurovascular intact Dorsiflexion/Plantar flexion intact Compartment soft Dressing/Incision -Minimal sanguinous drainage noted. Hemovac in place. Motor Function - intact, moving foot and toes well on exam.  Cardiovascular- Regular rate and rhythm, no murmurs/rubs/gallops Respiratory- Lungs clear to auscultation bilaterally Gastrointestinal- soft and nontender   Assessment/Plan: 2 Days Post-Op Procedure(s) (LRB): TOTAL HIP ARTHROPLASTY (Left) Active Problems:   H/O total hip arthroplasty  Estimated body mass index is 39.05 kg/m as calculated from the following:   Height as of this encounter: 6\' 1"  (1.854 m).   Weight as of this encounter:  134.3 kg. Advance diet Up with therapy Discharge home with home health  Hemovac removed.   DVT Prophylaxis - Lovenox, Ted hose and foot pumps Weight-Bearing as tolerated to left leg  Cassell Smiles, PA-C [facility] Orthopaedic Surgery 11/18/2019, 12:08 PM

## 2019-11-18 NOTE — Progress Notes (Signed)
Patient discharged home , with medication instructions , discharge summary and [place] [facility] medical mall by brother

## 2020-08-22 ENCOUNTER — Telehealth: Payer: Self-pay | Admitting: *Deleted

## 2020-08-22 NOTE — Telephone Encounter (Signed)
Attempted to call patient for annual lung screening. No answer and no voicemail available. 

## 2020-08-27 ENCOUNTER — Other Ambulatory Visit: Payer: Self-pay | Admitting: *Deleted

## 2020-08-27 DIAGNOSIS — Z87891 Personal history of nicotine dependence: Secondary | ICD-10-CM

## 2020-08-27 DIAGNOSIS — Z122 Encounter for screening for malignant neoplasm of respiratory organs: Secondary | ICD-10-CM

## 2020-08-27 DIAGNOSIS — F17200 Nicotine dependence, unspecified, uncomplicated: Secondary | ICD-10-CM

## 2020-08-27 NOTE — Progress Notes (Signed)
Contacted and scheduled for annual lung screening scan. Patient is a current smoker with a 85 pack year history

## 2020-08-31 ENCOUNTER — Ambulatory Visit: Payer: Medicaid Other

## 2020-09-13 ENCOUNTER — Ambulatory Visit: Payer: Medicaid Other

## 2020-09-26 ENCOUNTER — Encounter: Payer: Self-pay | Admitting: Nurse Practitioner

## 2020-09-26 NOTE — Progress Notes (Signed)
Lung Cancer Screening  In accordance with CMS guidelines, patient is 60 year old male, has met eligibility criteria including age, absence of signs or symptoms of lung cancer and meets requirements for lung cancer screening with low dose CT scan.   Social History   Tobacco Use  . Smoking status: Current Every Day Smoker    Packs/day: 2.00    Years: 40.00    Pack years: 80.00  . Smokeless tobacco: Never Used  . Tobacco comment: past history of 4 day  Substance Use Topics  . Alcohol use: Not Currently    A shared decision-making session was conducted prior to the performance of CT scan.   Beckey Rutter, DNP, AGNP-C [place] [facility] [phone] (clinic)

## 2020-10-10 ENCOUNTER — Encounter: Payer: Self-pay | Admitting: *Deleted

## 2020-10-10 ENCOUNTER — Telehealth: Payer: Self-pay | Admitting: *Deleted

## 2020-10-10 NOTE — Telephone Encounter (Signed)
Patient returned my call re: scheduling his annual lung screening ct scan. Current every day smoker, 2 ppd x 41 yrs. Scan scheduled for 10/12/20 @ 1:00 pm. Insurance auth already received.

## 2020-10-10 NOTE — Telephone Encounter (Signed)
Attempted to reach patient via telephone (mobile)  re: scheduling annual lung screening scan. Left voicemail message for patient to call me back.

## 2020-10-12 ENCOUNTER — Other Ambulatory Visit: Payer: Self-pay

## 2020-10-12 ENCOUNTER — Ambulatory Visit
Admission: RE | Admit: 2020-10-12 | Discharge: 2020-10-12 | Disposition: A | Discharge status: Home / Self Care | Payer: Medicaid Other | Source: Ambulatory Visit | Attending: Nurse Practitioner | Admitting: Nurse Practitioner

## 2020-10-12 DIAGNOSIS — F17200 Nicotine dependence, unspecified, uncomplicated: Secondary | ICD-10-CM | POA: Diagnosis present

## 2020-10-12 DIAGNOSIS — Z122 Encounter for screening for malignant neoplasm of respiratory organs: Secondary | ICD-10-CM | POA: Insufficient documentation

## 2020-10-12 DIAGNOSIS — Z87891 Personal history of nicotine dependence: Secondary | ICD-10-CM | POA: Insufficient documentation

## 2020-10-25 ENCOUNTER — Encounter: Payer: Self-pay | Admitting: *Deleted

## 2021-09-30 ENCOUNTER — Other Ambulatory Visit: Payer: Self-pay | Admitting: Orthopedic Surgery

## 2021-09-30 DIAGNOSIS — G8929 Other chronic pain: Secondary | ICD-10-CM

## 2021-09-30 DIAGNOSIS — Z96642 Presence of left artificial hip joint: Secondary | ICD-10-CM

## 2021-10-12 ENCOUNTER — Ambulatory Visit
Admission: RE | Admit: 2021-10-12 | Discharge: 2021-10-12 | Disposition: A | Discharge status: Home / Self Care | Payer: Medicaid Other | Source: Ambulatory Visit | Attending: Orthopedic Surgery | Admitting: Orthopedic Surgery

## 2021-10-12 DIAGNOSIS — Z96642 Presence of left artificial hip joint: Secondary | ICD-10-CM | POA: Diagnosis present

## 2021-10-12 DIAGNOSIS — M5441 Lumbago with sciatica, right side: Secondary | ICD-10-CM | POA: Diagnosis present

## 2021-10-12 DIAGNOSIS — G8929 Other chronic pain: Secondary | ICD-10-CM | POA: Diagnosis present

## 2021-10-16 ENCOUNTER — Other Ambulatory Visit: Payer: Self-pay | Admitting: *Deleted

## 2021-10-16 ENCOUNTER — Telehealth: Payer: Self-pay

## 2021-10-16 DIAGNOSIS — Z122 Encounter for screening for malignant neoplasm of respiratory organs: Secondary | ICD-10-CM

## 2021-10-16 DIAGNOSIS — Z87891 Personal history of nicotine dependence: Secondary | ICD-10-CM

## 2021-10-16 DIAGNOSIS — F17210 Nicotine dependence, cigarettes, uncomplicated: Secondary | ICD-10-CM

## 2021-10-16 NOTE — Telephone Encounter (Signed)
Attempted to reach pt to schedule annual LDCT.  Phone answered by mother.  She took the message for a return call. ?

## 2021-10-29 ENCOUNTER — Ambulatory Visit
Admission: RE | Admit: 2021-10-29 | Discharge: 2021-10-29 | Disposition: A | Discharge status: Home / Self Care | Payer: Medicaid Other | Source: Ambulatory Visit | Attending: Acute Care | Admitting: Acute Care

## 2021-10-29 DIAGNOSIS — F17210 Nicotine dependence, cigarettes, uncomplicated: Secondary | ICD-10-CM | POA: Diagnosis present

## 2021-10-29 DIAGNOSIS — Z122 Encounter for screening for malignant neoplasm of respiratory organs: Secondary | ICD-10-CM | POA: Diagnosis present

## 2021-10-29 DIAGNOSIS — Z87891 Personal history of nicotine dependence: Secondary | ICD-10-CM | POA: Diagnosis present

## 2021-11-07 ENCOUNTER — Other Ambulatory Visit: Payer: Self-pay | Admitting: Acute Care

## 2021-11-07 DIAGNOSIS — F17210 Nicotine dependence, cigarettes, uncomplicated: Secondary | ICD-10-CM

## 2021-11-07 DIAGNOSIS — Z122 Encounter for screening for malignant neoplasm of respiratory organs: Secondary | ICD-10-CM

## 2021-11-07 DIAGNOSIS — Z87891 Personal history of nicotine dependence: Secondary | ICD-10-CM

## 2022-02-12 ENCOUNTER — Encounter: Payer: Self-pay | Admitting: Physical Therapy

## 2022-02-12 ENCOUNTER — Ambulatory Visit: Payer: Medicaid Other | Attending: Family Medicine | Admitting: Physical Therapy

## 2022-02-12 DIAGNOSIS — M6281 Muscle weakness (generalized): Secondary | ICD-10-CM | POA: Insufficient documentation

## 2022-02-12 DIAGNOSIS — M5459 Other low back pain: Secondary | ICD-10-CM | POA: Diagnosis present

## 2022-02-12 DIAGNOSIS — M25551 Pain in right hip: Secondary | ICD-10-CM | POA: Diagnosis present

## 2022-02-12 DIAGNOSIS — M545 Low back pain, unspecified: Secondary | ICD-10-CM | POA: Insufficient documentation

## 2022-02-12 DIAGNOSIS — G8929 Other chronic pain: Secondary | ICD-10-CM | POA: Diagnosis not present

## 2022-02-12 DIAGNOSIS — M25552 Pain in left hip: Secondary | ICD-10-CM | POA: Insufficient documentation

## 2022-02-12 NOTE — Therapy (Signed)
OUTPATIENT PHYSICAL THERAPY THORACOLUMBAR EVALUATION   Patient Name: Johnny Williams MRN: [id number] DOB:12-18-1960, 61 y.o., male Today's Date: 02/12/2022   PT End of Session - 02/12/22 1414     Visit Number 1    Number of Visits 13    Date for PT Re-Evaluation 03/27/22    Authorization Type [employer], VL based on auth    Progress Note Due on Visit 10    PT Start Time 0845    PT Stop Time 0935    PT Time Calculation (min) 50 min    Activity Tolerance Patient limited by pain;Patient tolerated treatment well    Behavior During Therapy [facility] for tasks assessed/performed             Past Medical History:  Diagnosis Date   Anxiety    Arthritis    Chronic insomnia    COPD (chronic obstructive pulmonary disease) (HCC)    Dyspnea    Esophageal stricture    Esophageal varices (HCC)    GERD (gastroesophageal reflux disease)    Hyperlipidemia    Hypertension    Joint ache    Pre-diabetes    Reflux esophagitis    Sleep apnea    Past Surgical History:  Procedure Laterality Date   COLONOSCOPY WITH PROPOFOL N/A 09/06/2019   Procedure: COLONOSCOPY WITH PROPOFOL;  Surgeon: Lucilla Lame, MD;  Location: [facility] ENDOSCOPY;  Service: Endoscopy;  Laterality: N/A;   ESOPHAGOGASTRODUODENOSCOPY (EGD) WITH PROPOFOL N/A 09/06/2019   Procedure: ESOPHAGOGASTRODUODENOSCOPY (EGD) WITH PROPOFOL;  Surgeon: Lucilla Lame, MD;  Location: [facility] ENDOSCOPY;  Service: Endoscopy;  Laterality: N/A;   TOTAL HIP ARTHROPLASTY Right 05/19/2018   Procedure: TOTAL HIP ARTHROPLASTY;  Surgeon: Dereck Leep, MD;  Location: [facility] ORS;  Service: Orthopedics;  Laterality: Right;   TOTAL HIP ARTHROPLASTY Left 11/16/2019   Procedure: TOTAL HIP ARTHROPLASTY;  Surgeon: Dereck Leep, MD;  Location: [facility] ORS;  Service: Orthopedics;  Laterality: Left;  REQUESTING 3HRS   Patient Active Problem List   Diagnosis Date Noted   Gastroesophageal reflux disease with esophagitis    Screen for colon cancer    Polyp of sigmoid  colon    Erythrocytosis 07/29/2019   2-vessel coronary artery disease 07/07/2019   Aortic atherosclerosis ([place]) 07/07/2019   Cholelithiasis 07/07/2019   Obesity (BMI 35.0-39.9 without comorbidity) 07/07/2019   Vitamin B12 deficiency 07/07/2019   H/O total hip arthroplasty 05/19/2018   Left lumbar radiculitis 11/09/2017   DDD (degenerative disc disease), lumbosacral 11/09/2017   Vitamin D insufficiency 10/12/2017   Osteoarthritis of hip (Bilateral) (L>R) 10/12/2017   Lumbar facet syndrome (Bilateral) (L>R) 10/12/2017   Anxiety 09/29/2017   Chronic insomnia 09/29/2017   Cluster headaches 09/29/2017   COPD (chronic obstructive pulmonary disease) ([place]) 09/29/2017   Esophageal varices ([place]) 09/29/2017   Essential hypertension 09/29/2017   Sleep apnea 09/29/2017   Smoker 09/29/2017   Stricture of esophagus 09/29/2017   Type 2 diabetes mellitus without complication ([place]) 60/73/7106   Chronic low back pain (Secondary Area of Pain) (Bilateral) with sciatica (Left) 09/29/2017   Chronic lower extremity pain (Tertiary Area of Pain) (Bilateral) (L>R) 09/29/2017   Chronic hip pain (Primary Area of Pain) (Bilateral) (L>R) 09/29/2017   Chronic knee pain (Fourth Area of Pain) (Bilateral) (L>R) 09/29/2017   Chronic pain syndrome 09/29/2017   Pharmacologic therapy 09/29/2017   Disorder of skeletal system 09/29/2017   Problems influencing health status 09/29/2017   High risk medication use 05/11/2014   Gastritis and duodenitis 11/07/2013   Hyperlipidemia, unspecified 11/07/2013  Variants of migraine, not elsewhere classified, with intractable migraine, so stated, without mention of status migrainosus 11/07/2013    PCP: [facility]  REFERRING PROVIDER: Meeler, Sherren Kerns, FNP  REFERRING DIAGNOSIS:  M48.062 (ICD-10-CM) - Spinal stenosis, lumbar region with neurogenic claudication  M54.16 (ICD-10-CM) - Radiculopathy, lumbar region  M51.36 (ICD-10-CM) - Other intervertebral disc  degeneration, lumbar region    THERAPY DIAG: Chronic bilateral low back pain without sciatica  Pain in right hip  Pain in left hip  Muscle weakness (generalized)  RATIONALE FOR EVALUATION AND TREATMENT: Rehabilitation  ONSET DATE: 02/12/2014  FOLLOW UP APPT WITH PROVIDER: Yes    SUBJECTIVE:                                                                                                                                                                                         Chief Complaint: Patient is a 61 year old male with primary complaint of low back pain.   Pertinent History Patient is a 61 year old male with primary complaint of low back pain. Pt reports losing his job back in 2010. He reports in 2015, he started having issues with his legs and back. Pt reports pulling something in R anterolateral hip when walking on treadmill. Pt reports having therapy at [facility] and having alleviation of R hip/leg pain with working on his spine. Hx of bilateral THA; pt has remaining pain in his R hip s/p THA from 2019. 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 He denies paresthesias in lower limbs; he does report numbness in R hip. He reports constant pain in his R hip. He reports he is sedentary and watches TV during much of the day. He had hx of ESI that did alleviate his symptoms; however, he reports his pain returned the following morning.   Pain:  Pain Intensity: Present: 5/10, Best: 0/10, Worst: 7/10 Pain location: Axial low back and R paraspinal, R lateral thigh/lateral hip Pain Quality: constant in R hip, sharp pain in low back  Numbness/Tingling: Yes, R hip Focal Weakness: Yes, "muscles in legs have gone to sleep, because I'm not as active as I used to be"  Aggravating factors: prolonged sitting, sedentary activity, cold weather  Relieving factors: on the move 24-hour pain behavior: worse in AM  How long can you sit: 1  hour History of prior back injury, pain, surgery, or therapy: Yes, hx bilat THA Falls: Has patient fallen in last 6 months? No Follow-up appointment with MD: Yes, f/u with Whitney Meeler, date unknown  Imaging: Yes   MRI Lumbar Spine 10/12/21  Disc spaces: Degenerative disease with disc height loss throughout the thoracolumbar spine.   T12-L1: Mild broad-based disc bulge. No foraminal or central canal stenosis.   L1-L2: Mild broad-based disc bulge. No foraminal or central canal stenosis.   L2-L3: Mild broad-based disc bulge flattening the ventral thecal sac. Mild bilateral facet arthropathy. Mild spinal stenosis. No significant foraminal or central canal stenosis.   L3-L4: Broad-based disc bulge flattening ventral thecal sac. Mild bilateral facet arthropathy. Moderate spinal stenosis. Bilateral subarticular recess stenosis. Moderate bilateral foraminal stenosis.   L4-L5: Broad-based disc bulge. Mild bilateral facet arthropathy. Bilateral subarticular recess stenosis. Moderate bilateral foraminal stenosis, right greater than left. Mild spinal stenosis.   L5-S1: Broad-based disc osteophyte complex. Mild bilateral facet arthropathy. Moderate left foraminal stenosis. Mild right foraminal stenosis. No spinal stenosis.   IMPRESSION: 1. Diffuse lumbar spine spondylosis as described above. 2. No acute osseous injury of the lumbar spine.     By: Kathreen Devoid M.D.   On: 10/12/2021 08:46   Prior level of function: Independent Occupational demands: Pt on disability  Hobbies: yard work, fishing, Investment banker, operational flags (bowel/bladder changes, saddle paresthesia, personal history of cancer, h/o spinal tumors, h/o compression fx, h/o abdominal aneurysm, abdominal pain, chills/fever, night sweats, nausea, vomiting, unrelenting pain, first onset of insidious LBP <20 y/o): Negative   Precautions: None  Weight Bearing Restrictions: No  Living Environment Lives with: lives with their  family, lives with 81 year old mother Lives in: Mobile home  Patient Goals: Improved pain    OBJECTIVE:   Patient Surveys  FOTO 16 (risk-adjusted 24), predicted outcome score of 60   Cognition Patient is oriented to person, place, and time.  Recent memory is intact.  Remote memory is intact.  Attention span and concentration are intact.  Expressive speech is intact.  Patient's fund of knowledge is within normal limits for educational level.     Gross Musculoskeletal Assessment Tremor: None Bulk: Normal Tone: Normal No visible step-off along spinal column, no signs of scoliosis   GAIT: Forward flexed posture, mild guarded posture with dec arm swing and trunk rotation    Posture: Lumbar lordosis: WNL Iliac crest height: Equal bilaterally Lumbar lateral shift: Negative Lower crossed syndrome (tight hip flexors and erector spinae; weak gluts and abs): Negative   AROM  AROM (Normal range in degrees) AROM  02/12/2022  Lumbar   Flexion (65) WNL  Extension (30) WNL  Right lateral flexion (25) WNL  Left lateral flexion (25) WNL*  Right rotation (30) WNL  Left rotation (30) WNL      Hip Right Left  Flexion (125) 120 (pain at 90 deg hip flexion along R greater trochanter) WNL  Extension (15)    Abduction (40) WNL WNL  Adduction     Internal Rotation (45)    External Rotation (45) WNL WNL      Ankle    Dorsiflexion (20) WNL WNL  Plantarflexion (50)    Inversion (35)    Eversion (15    (* = pain; Blank rows = not tested)   LE MMT:  MMT (out of 5) Right 02/12/2022 Left 02/12/2022  Hip flexion 4 4*  Hip extension 4- 4-  Hip abduction 4- 4-  Hip adduction    Hip internal rotation    Hip external rotation    Knee flexion 5 5  Knee extension 5 5  Ankle dorsiflexion 4+ 4+  Ankle plantarflexion    Ankle inversion    Ankle eversion    Great  toe extension 4+ 4+  (* = pain; Blank rows = not tested)   Sensation Grossly intact to light touch bilateral LEs  as determined by testing dermatomes L2-S2. Proprioception, and hot/cold testing deferred on this date.   Reflexes R/L Knee Jerk (L3/4): 2+/2+  Ankle Jerk (S1/2): 3+/3+    Muscle Length Hamstrings: R: Positive, lacking 30 deg L: Positive, lacking 45 deg [place] (quadriceps): R: Positive L: Positive Thomas (hip flexors): R: Not examined L: Not examined    Palpation  Location LEFT  RIGHT           Lumbar paraspinals 1 1  Quadratus Lumborum    Gluteus Maximus    Gluteus Medius  2  Deep hip external rotators    PSIS    Fortin's Area (SIJ)    Greater Trochanter    (Blank rows = not tested) Graded on 0-4 scale (0 = no pain, 1 = pain, 2 = pain with wincing/grimacing/flinching, 3 = pain with withdrawal, 4 = unwilling to allow palpation), (Blank rows = not tested)   Passive Accessory Intervertebral Motion Deferred to next visit    SPECIAL TESTS Lumbar Radiculopathy and Discogenic: Centralization and Peripheralization (SN 92, -LR 0.12): Not examined Slump (SN 83, -LR 0.32): R: Negative L: Positive SLR (SN 92, -LR 0.29): R: Negative L:  Negative Crossed SLR (SP 90): R: Negative L: Negative  Facet Joint: Extension-Rotation (SN 100, -LR 0.0): R: Negative L: Positive  Lumbar Foraminal Stenosis: Lumbar quadrant (SN 70): R: Positive L: Positive     TODAY'S TREATMENT    Therapeutic Exercise - for HEP establishment, discussion on appropriate exercise/activity modification, PT education   Reviewed baseline home exercises and provided handout for [facility] program (see Access Code); tactile cueing and therapist demonstration utilized as needed for carryover of proper technique to HEP.    Patient education on current condition, anatomy involved, prognosis, plan of care. Discussion on increasing daily physical activity and utilizing daily walking program. Reviewed smoking cessation goals and discussed tapering to lower volume with long-term goal of quitting.      PATIENT  EDUCATION:  Education details: see above for patient education details  Person educated: Patient Education method: Explanation, Demonstration, and Handouts Education comprehension: verbalized understanding and returned demonstration   HOME EXERCISE PROGRAM: Access Code [phone]   ASSESSMENT:  CLINICAL IMPRESSION: Patient is a 61 y.o. male who was seen today for physical therapy evaluation and treatment for back pain. Patient's presentation is affected by significant contextual factors, including chronic heavy smoking and sedentary lifestyle. Pt does not have significant radicular symptoms today with negative bilateral SLR. Objective impairments include decreased activity tolerance, decreased mobility, difficulty walking, decreased ROM, decreased strength, hypomobility, impaired flexibility, postural dysfunction, and pain. These impairments are limiting patient from shopping, community activity, and yard work. Personal factors including 3+ comorbidities: COPD, chronic smoker, anxiety, complicated orthopedic history, obesity  are also affecting patient's functional outcome. Patient will benefit from skilled PT to address above impairments and improve overall function.  REHAB POTENTIAL: Good  CLINICAL DECISION MAKING: Evolving/moderate complexity  EVALUATION COMPLEXITY: Moderate   GOALS:  SHORT TERM GOALS: Target date: 02/26/2022  Pt will be independent with HEP to improve strength and decrease back pain to improve pain-free function at home and work. Baseline: 02/12/22: Baseline HEP initiated Goal status: INITIAL  Pt will complete walking program at least 3-5 days per week for 10-15 minutes or longer as needed for improved long-term health and to decrease central sensitivity related to chronic pain Baseline: 02/12/22:  Initial education given for increase in physical activity and walking program most days of the week.  Goal status: INITIAL   LONG TERM GOALS: Target date: 03/27/2022  Pt  will increase FOTO to at least 60 to demonstrate significant improvement in function at home and work related to back pain  Baseline: 02/12/22: 59 (risk-adjusted 46) Goal status: INITIAL  2.  Pt will decrease worst back pain by at least 2 points on the NPRS in order to demonstrate clinically significant reduction in back pain. Baseline: 02/12/22: 7/10 at worst Goal status: INITIAL  3.  Patient will have MMT 4+/5 or greater for all hip muscles tested indicative of improved strength as needed for improved tolerance of performing yard work and tolerance to loading affected tissues for improved daily physical activity tolerance Baseline: 02/12/22: MMTs for bilateral hips 4- to 4/5 (see table above).  Goal status: INITIAL  4.  Patient will be able to sit and perform standing/ambulatory activity > 1 hour as needed for improved ability to participate in social activities and leisure activities as well as complete community outings/errands Baseline: 02/12/22: Pt has to stop sitting at 1 hour due to low back pain.  Goal status: INITIAL   PLAN: PT FREQUENCY: 2x/week  PT DURATION: 8 weeks  PLANNED INTERVENTIONS: Therapeutic exercises, Therapeutic activity, Neuromuscular re-education, Patient/Family education, Dry Needling, Electrical stimulation, Spinal manipulation, Spinal mobilization, Cryotherapy, Moist heat, Traction, and Manual therapy  PLAN FOR NEXT SESSION: Check passive accessory motion. Continued with manual therapy/STM to improve sensitivity of lumbar paraspinals, gluteal musculature, and R anterior hip. Initiate PT with gentle graded movement with gradual introduction of hip strengthening as tolerated.     Valentina Gu, PT, DPT #[id number]  Eilleen Kempf 02/12/2022, 2:30 PM

## 2022-02-13 NOTE — Addendum Note (Signed)
Addended by: Valentina Gu T on: 02/13/2022 05:02 PM   Modules accepted: Orders

## 2022-02-18 ENCOUNTER — Encounter: Payer: Self-pay | Admitting: Physical Therapy

## 2022-02-18 ENCOUNTER — Ambulatory Visit: Payer: Medicaid Other | Admitting: Physical Therapy

## 2022-02-18 DIAGNOSIS — M25552 Pain in left hip: Secondary | ICD-10-CM

## 2022-02-18 DIAGNOSIS — M25551 Pain in right hip: Secondary | ICD-10-CM

## 2022-02-18 DIAGNOSIS — M5459 Other low back pain: Secondary | ICD-10-CM | POA: Diagnosis not present

## 2022-02-18 DIAGNOSIS — M6281 Muscle weakness (generalized): Secondary | ICD-10-CM

## 2022-02-18 NOTE — Therapy (Signed)
OUTPATIENT PHYSICAL THERAPY TREATMENT NOTE   Patient Name: Johnny Williams MRN: [id number] DOB:[birth year], 61 y.o., male Today's Date: 02/18/2022   END OF SESSION:    PT End of Session - 02/18/22 0842     Visit Number 2    Number of Visits 13    Date for PT Re-Evaluation 03/27/22    Authorization Type [employer], VL based on auth    Progress Note Due on Visit 10    PT Start Time 0900    PT Stop Time 0945    PT Time Calculation (min) 45 min    Activity Tolerance Patient limited by pain;Patient tolerated treatment well    Behavior During Therapy [facility] for tasks assessed/performed              Past Medical History:  Diagnosis Date   Anxiety    Arthritis    Chronic insomnia    COPD (chronic obstructive pulmonary disease) (HCC)    Dyspnea    Esophageal stricture    Esophageal varices (HCC)    GERD (gastroesophageal reflux disease)    Hyperlipidemia    Hypertension    Joint ache    Pre-diabetes    Reflux esophagitis    Sleep apnea    Past Surgical History:  Procedure Laterality Date   COLONOSCOPY WITH PROPOFOL N/A 09/06/2019   Procedure: COLONOSCOPY WITH PROPOFOL;  Surgeon: Lucilla Lame, MD;  Location: [facility] ENDOSCOPY;  Service: Endoscopy;  Laterality: N/A;   ESOPHAGOGASTRODUODENOSCOPY (EGD) WITH PROPOFOL N/A 09/06/2019   Procedure: ESOPHAGOGASTRODUODENOSCOPY (EGD) WITH PROPOFOL;  Surgeon: Lucilla Lame, MD;  Location: [facility] ENDOSCOPY;  Service: Endoscopy;  Laterality: N/A;   TOTAL HIP ARTHROPLASTY Right 05/19/2018   Procedure: TOTAL HIP ARTHROPLASTY;  Surgeon: Dereck Leep, MD;  Location: [facility] ORS;  Service: Orthopedics;  Laterality: Right;   TOTAL HIP ARTHROPLASTY Left 11/16/2019   Procedure: TOTAL HIP ARTHROPLASTY;  Surgeon: Dereck Leep, MD;  Location: [facility] ORS;  Service: Orthopedics;  Laterality: Left;  REQUESTING 3HRS   Patient Active Problem List   Diagnosis Date Noted   Gastroesophageal reflux disease with esophagitis    Screen for colon cancer     Polyp of sigmoid colon    Erythrocytosis 07/29/2019   2-vessel coronary artery disease 07/07/2019   Aortic atherosclerosis ([place]) 07/07/2019   Cholelithiasis 07/07/2019   Obesity (BMI 35.0-39.9 without comorbidity) 07/07/2019   Vitamin B12 deficiency 07/07/2019   H/O total hip arthroplasty 05/19/2018   Left lumbar radiculitis 11/09/2017   DDD (degenerative disc disease), lumbosacral 11/09/2017   Vitamin D insufficiency 10/12/2017   Osteoarthritis of hip (Bilateral) (L>R) 10/12/2017   Lumbar facet syndrome (Bilateral) (L>R) 10/12/2017   Anxiety 09/29/2017   Chronic insomnia 09/29/2017   Cluster headaches 09/29/2017   COPD (chronic obstructive pulmonary disease) ([place]) 09/29/2017   Esophageal varices ([place]) 09/29/2017   Essential hypertension 09/29/2017   Sleep apnea 09/29/2017   Smoker 09/29/2017   Stricture of esophagus 09/29/2017   Type 2 diabetes mellitus without complication ([place]) 52/84/1324   Chronic low back pain (Secondary Area of Pain) (Bilateral) with sciatica (Left) 09/29/2017   Chronic lower extremity pain (Tertiary Area of Pain) (Bilateral) (L>R) 09/29/2017   Chronic hip pain (Primary Area of Pain) (Bilateral) (L>R) 09/29/2017   Chronic knee pain (Fourth Area of Pain) (Bilateral) (L>R) 09/29/2017   Chronic pain syndrome 09/29/2017   Pharmacologic therapy 09/29/2017   Disorder of skeletal system 09/29/2017   Problems influencing health status 09/29/2017   High risk medication use 05/11/2014   Gastritis and  duodenitis 11/07/2013   Hyperlipidemia, unspecified 11/07/2013   Variants of migraine, not elsewhere classified, with intractable migraine, so stated, without mention of status migrainosus 11/07/2013    PCP: [facility], [place] REFERRING PROVIDER: Sherren Kerns Meeler, FNP  REFERRING DIAG:  [phone] (ICD-10-CM) - Spinal stenosis, lumbar region with neurogenic claudication  M54.16 (ICD-10-CM) - Radiculopathy, lumbar region  M51.36 (ICD-10-CM) - Other intervertebral  disc degeneration, lumbar region    THERAPY DIAG:  Other low back pain  Pain in right hip  Pain in left hip  Muscle weakness (generalized)  Rationale for Evaluation and Treatment Rehabilitation  PERTINENT HISTORY: Patient is a 61 year old male with primary complaint of low back pain. Pt reports losing his job back in 2010. He reports in 2015, he started having issues with his legs and back. Pt reports pulling something in R anterolateral hip when walking on treadmill. Pt reports having therapy at [facility] and having alleviation of R hip/leg pain with working on his spine. Hx of bilateral THA; pt has remaining pain in his R hip s/p THA from 2019. 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 He denies paresthesias in lower limbs; he does report numbness in R hip. He reports constant pain in his R hip. He reports he is sedentary and watches TV during much of the day. He had hx of ESI that did alleviate his symptoms; however, he reports his pain returned the following morning.    Pain:  Pain Intensity: Present: 5/10, Best: 0/10, Worst: 7/10 Pain location: Axial low back and R paraspinal, R lateral thigh/lateral hip Pain Quality: constant in R hip, sharp pain in low back  Numbness/Tingling: Yes, R hip Focal Weakness: Yes, "muscles in legs have gone to sleep, because I'm not as active as I used to be"  Aggravating factors: prolonged sitting, sedentary activity, cold weather  Relieving factors: on the move 24-hour pain behavior: worse in AM  How long can you sit: 1 hour History of prior back injury, pain, surgery, or therapy: Yes, hx bilat THA Falls: Has patient fallen in last 6 months? No Follow-up appointment with MD: Yes, f/u with Whitney Meeler, date unknown   Imaging: Yes    MRI Lumbar Spine 10/12/21 Disc spaces: Degenerative disease with disc height loss throughout the thoracolumbar spine.   T12-L1: Mild broad-based  disc bulge. No foraminal or central canal stenosis.   L1-L2: Mild broad-based disc bulge. No foraminal or central canal stenosis.   L2-L3: Mild broad-based disc bulge flattening the ventral thecal sac. Mild bilateral facet arthropathy. Mild spinal stenosis. No significant foraminal or central canal stenosis.   L3-L4: Broad-based disc bulge flattening ventral thecal sac. Mild bilateral facet arthropathy. Moderate spinal stenosis. Bilateral subarticular recess stenosis. Moderate bilateral foraminal stenosis.   L4-L5: Broad-based disc bulge. Mild bilateral facet arthropathy. Bilateral subarticular recess stenosis. Moderate bilateral foraminal stenosis, right greater than left. Mild spinal stenosis.   L5-S1: Broad-based disc osteophyte complex. Mild bilateral facet arthropathy. Moderate left foraminal stenosis. Mild right foraminal stenosis. No spinal stenosis.   IMPRESSION: 1. Diffuse lumbar spine spondylosis as described above. 2. No acute osseous injury of the lumbar spine.     By: Kathreen Devoid M.D.   On: 10/12/2021 08:46     Prior level of function: Independent Occupational demands: Pt on disability  Hobbies: yard work, fishing, Probation officer flags (bowel/bladder changes, saddle paresthesia, personal history of cancer,  h/o spinal tumors, h/o compression fx, h/o abdominal aneurysm, abdominal pain, chills/fever, night sweats, nausea, vomiting, unrelenting pain, first onset of insidious LBP <20 y/o): Negative     Precautions: None   Weight Bearing Restrictions: No   Living Environment Lives with: lives with their family, lives with 80 year old mother Lives in: Mobile home   Patient Goals: Improved pain    PRECAUTIONS: None   OBJECTIVE: (objective measures completed at initial evaluation unless otherwise dated)   Patient Surveys  FOTO 39 (risk-adjusted 29), predicted outcome score of 60     Cognition Patient is oriented to person, place, and time.  Recent  memory is intact.  Remote memory is intact.  Attention span and concentration are intact.  Expressive speech is intact.  Patient's fund of knowledge is within normal limits for educational level.                            Gross Musculoskeletal Assessment Tremor: None Bulk: Normal Tone: Normal No visible step-off along spinal column, no signs of scoliosis     GAIT: Forward flexed posture, mild guarded posture with dec arm swing and trunk rotation       AROM       AROM (Normal range in degrees) AROM  02/12/2022  Lumbar    Flexion (65) WNL  Extension (30) WNL  Right lateral flexion (25) WNL  Left lateral flexion (25) WNL*  Right rotation (30) WNL  Left rotation (30) WNL         Hip Right Left  Flexion (125) 120 (pain at 90 deg hip flexion along R greater trochanter) WNL  Extension (15)      Abduction (40) WNL WNL  Adduction       Internal Rotation (45)      External Rotation (45) WNL WNL         Ankle      Dorsiflexion (20) WNL WNL  Plantarflexion (50)      Inversion (35)      Eversion (15      (* = pain; Blank rows = not tested)     LE MMT:   MMT (out of 5) Right 02/12/2022 Left 02/12/2022  Hip flexion 4 4*  Hip extension 4- 4-  Hip abduction 4- 4-  Hip adduction      Hip internal rotation      Hip external rotation      Knee flexion 5 5  Knee extension 5 5  Ankle dorsiflexion 4+ 4+  Ankle plantarflexion      Ankle inversion      Ankle eversion      Great toe extension 4+ 4+  (* = pain; Blank rows = not tested)     Sensation Grossly intact to light touch bilateral LEs as determined by testing dermatomes L2-S2. Proprioception, and hot/cold testing deferred on this date.     Reflexes R/L Knee Jerk (L3/4): 2+/2+  Ankle Jerk (S1/2): 3+/3+      Muscle Length Hamstrings: R: Positive, lacking 30 deg L: Positive, lacking 45 deg [place] (quadriceps): R: Positive L: Positive [place] (hip flexors): R: Not examined L: Not examined       Palpation    Location LEFT  RIGHT           Lumbar paraspinals 1 1  Quadratus Lumborum      Gluteus Maximus      Gluteus Medius   2  Deep hip external rotators  PSIS      Fortin's Area (SIJ)      Greater Trochanter      (Blank rows = not tested) Graded on 0-4 scale (0 = no pain, 1 = pain, 2 = pain with wincing/grimacing/flinching, 3 = pain with withdrawal, 4 = unwilling to allow palpation), (Blank rows = not tested)     Passive Accessory Intervertebral Motion 02/18/22: Hypomobile T3-T10, L3-5 with no pain at restriction     SPECIAL TESTS Lumbar Radiculopathy and Discogenic: Centralization and Peripheralization (SN 92, -LR 0.12): Not examined Slump (SN 83, -LR 0.32): R: Negative L: Positive SLR (SN 92, -LR 0.29): R: Negative L:  Negative Crossed SLR (SP 90): R: Negative L: Negative   Facet Joint: Extension-Rotation (SN 100, -LR 0.0): R: Negative L: Positive   Lumbar Foraminal Stenosis: Lumbar quadrant (SN 70): R: Positive L: Positive   02/18/22: Lumbar spine general distraction in hooklying:       TODAY'S TREATMENT   SUBJECTIVE: Patient reports not completing walking program. Patient reports compliance with his initial home exercises. Patient reports no recent disturbed sleep due to back pain. He reports using OTC medicine this morning for pain control.   PAIN:  Are you having pain? Yes: NPRS scale: 3/10 Pain location: pain in lower lumbar region across waistline, sharp pain constantly along R inner thigh       Manual Therapy - for symptom modulation, soft tissue sensitivity and mobility, joint mobility, ROM   STM/DTM bilateral erector spinae L3-L5 CPA gr II for pain control, gr III-IV for mobility L3-5 and T3-T10; 2x30 sec at each region for each grade    Therapeutic Exercise - for improved soft tissue flexibility and extensibility as needed for ROM, improved strength as needed to improve performance of CKC activities/functional movements  Open book; 1 x 10 on each  side  Lower trunk rotations; x10 ea dir in hooklying  Seated hamstring stretch; 2x30 sec   PATIENT EDUCATION: Updated and reviewed [place] home exercise program. Spent 8 minutes reviewing pre-existing home exercise programs from prior episodes of PT including post-THA rehab. Instructed patient on truncating his home program to include most recent advanced home exercises from [facility] and excluding exercises from 2016 and prior.          PATIENT EDUCATION:  Education details: see above for patient education details   Person educated: Patient Education method: Explanation, Demonstration, and Handouts Education comprehension: verbalized understanding and returned demonstration     HOME EXERCISE PROGRAM: Access Code [phone]     ASSESSMENT:   CLINICAL IMPRESSION:  Patient reports improved pain following treatment today and tolerates grade III-IV CPA well along affected levels of lumbar spine. Pt has undergone therapy at multiple clinics prior to this episode of care and arrives today with multiple HEPs. Significant time was spent reviewing pre-existing programs and truncating patient's existing program. Open book for thoracic mobility and hamstring stretching was added to patient's program today. Benefits of smoking cessation (with short-term goal of lessening volume or cigarettes/day) and increasing regular physical activity were also reviewed.  Patient has remaining deficits in thoracic and lumbar spine accessory mobility, postural changes, hip flexor and gluteal weakness, and local nociceptive low back and bilateral paraspinal pain without paresthesias.  Patient will benefit from continued skilled PT to address above impairments and improve overall function.   REHAB POTENTIAL: Good   CLINICAL DECISION MAKING: Evolving/moderate complexity   EVALUATION COMPLEXITY: Moderate     GOALS:   SHORT TERM GOALS: Target date: 02/26/2022  Pt will be independent with HEP to improve  strength and decrease back pain to improve pain-free function at home and work. Baseline: 02/12/22: Baseline HEP initiated Goal status: INITIAL   Pt will complete walking program at least 3-5 days per week for 10-15 minutes or longer as needed for improved long-term health and to decrease central sensitivity related to chronic pain Baseline: 02/12/22: Initial education given for increase in physical activity and walking program most days of the week.  Goal status: INITIAL     LONG TERM GOALS: Target date: 03/27/2022   Pt will increase FOTO to at least 60 to demonstrate significant improvement in function at home and work related to back pain  Baseline: 02/12/22: 59 (risk-adjusted 46) Goal status: INITIAL   2.  Pt will decrease worst back pain by at least 2 points on the NPRS in order to demonstrate clinically significant reduction in back pain. Baseline: 02/12/22: 7/10 at worst Goal status: INITIAL   3.  Patient will have MMT 4+/5 or greater for all hip muscles tested indicative of improved strength as needed for improved tolerance of performing yard work and tolerance to loading affected tissues for improved daily physical activity tolerance Baseline: 02/12/22: MMTs for bilateral hips 4- to 4/5 (see table above).  Goal status: INITIAL   4.  Patient will be able to sit and perform standing/ambulatory activity > 1 hour as needed for improved ability to participate in social activities and leisure activities as well as complete community outings/errands Baseline: 02/12/22: Pt has to stop sitting at 1 hour due to low back pain.  Goal status: INITIAL     PLAN: PT FREQUENCY: 2x/week   PT DURATION: 8 weeks   PLANNED INTERVENTIONS: Therapeutic exercises, Therapeutic activity, Neuromuscular re-education, Patient/Family education, Dry Needling, Electrical stimulation, Spinal manipulation, Spinal mobilization, Cryotherapy, Moist heat, Traction, and Manual therapy   PLAN FOR NEXT SESSION: Continued  with manual therapy/STM to improve sensitivity of lumbar paraspinals, gluteal musculature, and R anterior hip. Continue with gentle graded movement with gradual introduction of hip strengthening as tolerated.       Valentina Gu, PT, DPT #[id number]  Eilleen Kempf, PT 02/18/2022, 8:43 AM

## 2022-02-20 ENCOUNTER — Encounter: Payer: Self-pay | Admitting: Physical Therapy

## 2022-02-20 ENCOUNTER — Ambulatory Visit: Payer: Medicaid Other | Admitting: Physical Therapy

## 2022-02-20 DIAGNOSIS — M5459 Other low back pain: Secondary | ICD-10-CM | POA: Diagnosis not present

## 2022-02-20 DIAGNOSIS — M25552 Pain in left hip: Secondary | ICD-10-CM

## 2022-02-20 DIAGNOSIS — M25551 Pain in right hip: Secondary | ICD-10-CM

## 2022-02-20 DIAGNOSIS — M6281 Muscle weakness (generalized): Secondary | ICD-10-CM

## 2022-02-20 NOTE — Therapy (Addendum)
OUTPATIENT PHYSICAL THERAPY TREATMENT NOTE   Patient Name: Johnny Williams MRN: [id number] DOB:[birth year], 61 y.o., male Today's Date: 02/20/2022   END OF SESSION:   PT End of Session - 02/20/22 2238     Visit Number 3    Number of Visits 13    Date for PT Re-Evaluation 03/27/22    Authorization Type [employer], VL based on auth    Progress Note Due on Visit 10    PT Start Time 0900    PT Stop Time 0943    PT Time Calculation (min) 43 min    Activity Tolerance Patient limited by pain;Patient tolerated treatment well    Behavior During Therapy [facility] for tasks assessed/performed             Past Medical History:  Diagnosis Date   Anxiety    Arthritis    Chronic insomnia    COPD (chronic obstructive pulmonary disease) (HCC)    Dyspnea    Esophageal stricture    Esophageal varices (HCC)    GERD (gastroesophageal reflux disease)    Hyperlipidemia    Hypertension    Joint ache    Pre-diabetes    Reflux esophagitis    Sleep apnea    Past Surgical History:  Procedure Laterality Date   COLONOSCOPY WITH PROPOFOL N/A 09/06/2019   Procedure: COLONOSCOPY WITH PROPOFOL;  Surgeon: Lucilla Lame, MD;  Location: [facility] ENDOSCOPY;  Service: Endoscopy;  Laterality: N/A;   ESOPHAGOGASTRODUODENOSCOPY (EGD) WITH PROPOFOL N/A 09/06/2019   Procedure: ESOPHAGOGASTRODUODENOSCOPY (EGD) WITH PROPOFOL;  Surgeon: Lucilla Lame, MD;  Location: [facility] ENDOSCOPY;  Service: Endoscopy;  Laterality: N/A;   TOTAL HIP ARTHROPLASTY Right 05/19/2018   Procedure: TOTAL HIP ARTHROPLASTY;  Surgeon: Dereck Leep, MD;  Location: [facility] ORS;  Service: Orthopedics;  Laterality: Right;   TOTAL HIP ARTHROPLASTY Left 11/16/2019   Procedure: TOTAL HIP ARTHROPLASTY;  Surgeon: Dereck Leep, MD;  Location: [facility] ORS;  Service: Orthopedics;  Laterality: Left;  REQUESTING 3HRS   Patient Active Problem List   Diagnosis Date Noted   Gastroesophageal reflux disease with esophagitis    Screen for colon cancer    Polyp  of sigmoid colon    Erythrocytosis 07/29/2019   2-vessel coronary artery disease 07/07/2019   Aortic atherosclerosis ([place]) 07/07/2019   Cholelithiasis 07/07/2019   Obesity (BMI 35.0-39.9 without comorbidity) 07/07/2019   Vitamin B12 deficiency 07/07/2019   H/O total hip arthroplasty 05/19/2018   Left lumbar radiculitis 11/09/2017   DDD (degenerative disc disease), lumbosacral 11/09/2017   Vitamin D insufficiency 10/12/2017   Osteoarthritis of hip (Bilateral) (L>R) 10/12/2017   Lumbar facet syndrome (Bilateral) (L>R) 10/12/2017   Anxiety 09/29/2017   Chronic insomnia 09/29/2017   Cluster headaches 09/29/2017   COPD (chronic obstructive pulmonary disease) ([place]) 09/29/2017   Esophageal varices ([place]) 09/29/2017   Essential hypertension 09/29/2017   Sleep apnea 09/29/2017   Smoker 09/29/2017   Stricture of esophagus 09/29/2017   Type 2 diabetes mellitus without complication ([place]) 02/72/5366   Chronic low back pain (Secondary Area of Pain) (Bilateral) with sciatica (Left) 09/29/2017   Chronic lower extremity pain (Tertiary Area of Pain) (Bilateral) (L>R) 09/29/2017   Chronic hip pain (Primary Area of Pain) (Bilateral) (L>R) 09/29/2017   Chronic knee pain (Fourth Area of Pain) (Bilateral) (L>R) 09/29/2017   Chronic pain syndrome 09/29/2017   Pharmacologic therapy 09/29/2017   Disorder of skeletal system 09/29/2017   Problems influencing health status 09/29/2017   High risk medication use 05/11/2014   Gastritis and duodenitis 11/07/2013  Hyperlipidemia, unspecified 11/07/2013   Variants of migraine, not elsewhere classified, with intractable migraine, so stated, without mention of status migrainosus 11/07/2013      PCP: [facility], [place] REFERRING PROVIDER: Sherren Kerns Meeler, FNP   REFERRING DIAG:  R15.400 (ICD-10-CM) - Spinal stenosis, lumbar region with neurogenic claudication  M54.16 (ICD-10-CM) - Radiculopathy, lumbar region  M51.36 (ICD-10-CM) - Other intervertebral  disc degeneration, lumbar region      THERAPY DIAG:  Other low back pain   Pain in right hip   Pain in left hip   Muscle weakness (generalized)   Rationale for Evaluation and Treatment Rehabilitation   PERTINENT HISTORY: Patient is a 61 year old male with primary complaint of low back pain. Pt reports losing his job back in 2010. He reports in 2015, he started having issues with his legs and back. Pt reports pulling something in R anterolateral hip when walking on treadmill. Pt reports having therapy at [facility] and having alleviation of R hip/leg pain with working on his spine. Hx of bilateral THA; pt has remaining pain in his R hip s/p THA from 2019. 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 He denies paresthesias in lower limbs; he does report numbness in R hip. He reports constant pain in his R hip. He reports he is sedentary and watches TV during much of the day. He had hx of ESI that did alleviate his symptoms; however, he reports his pain returned the following morning.    Pain:  Pain Intensity: Present: 5/10, Best: 0/10, Worst: 7/10 Pain location: Axial low back and R paraspinal, R lateral thigh/lateral hip Pain Quality: constant in R hip, sharp pain in low back  Numbness/Tingling: Yes, R hip Focal Weakness: Yes, "muscles in legs have gone to sleep, because I'm not as active as I used to be"  Aggravating factors: prolonged sitting, sedentary activity, cold weather  Relieving factors: on the move 24-hour pain behavior: worse in AM  How long can you sit: 1 hour History of prior back injury, pain, surgery, or therapy: Yes, hx bilat THA Falls: Has patient fallen in last 6 months? No Follow-up appointment with MD: Yes, f/u with Whitney Meeler, date unknown   Imaging: Yes    MRI Lumbar Spine 10/12/21 Disc spaces: Degenerative disease with disc height loss throughout the thoracolumbar spine.   T12-L1: Mild  broad-based disc bulge. No foraminal or central canal stenosis.   L1-L2: Mild broad-based disc bulge. No foraminal or central canal stenosis.   L2-L3: Mild broad-based disc bulge flattening the ventral thecal sac. Mild bilateral facet arthropathy. Mild spinal stenosis. No significant foraminal or central canal stenosis.   L3-L4: Broad-based disc bulge flattening ventral thecal sac. Mild bilateral facet arthropathy. Moderate spinal stenosis. Bilateral subarticular recess stenosis. Moderate bilateral foraminal stenosis.   L4-L5: Broad-based disc bulge. Mild bilateral facet arthropathy. Bilateral subarticular recess stenosis. Moderate bilateral foraminal stenosis, right greater than left. Mild spinal stenosis.   L5-S1: Broad-based disc osteophyte complex. Mild bilateral facet arthropathy. Moderate left foraminal stenosis. Mild right foraminal stenosis. No spinal stenosis.   IMPRESSION: 1. Diffuse lumbar spine spondylosis as described above. 2. No acute osseous injury of the lumbar spine.     By: Kathreen Devoid M.D.   On: 10/12/2021 08:46     Prior level of function: Independent Occupational demands: Pt on disability  Hobbies: yard work, fishing, Probation officer flags (bowel/bladder changes,  saddle paresthesia, personal history of cancer, h/o spinal tumors, h/o compression fx, h/o abdominal aneurysm, abdominal pain, chills/fever, night sweats, nausea, vomiting, unrelenting pain, first onset of insidious LBP <20 y/o): Negative     Precautions: None   Weight Bearing Restrictions: No   Living Environment Lives with: lives with their family, lives with 80 year old mother Lives in: Mobile home   Patient Goals: Improved pain     PRECAUTIONS: None     OBJECTIVE: (objective measures completed at initial evaluation unless otherwise dated)     Patient Surveys  FOTO 32 (risk-adjusted 38), predicted outcome score of 60     Cognition Patient is oriented to person, place, and  time.  Recent memory is intact.  Remote memory is intact.  Attention span and concentration are intact.  Expressive speech is intact.  Patient's fund of knowledge is within normal limits for educational level.                            Gross Musculoskeletal Assessment Tremor: None Bulk: Normal Tone: Normal No visible step-off along spinal column, no signs of scoliosis     GAIT: Forward flexed posture, mild guarded posture with dec arm swing and trunk rotation       AROM          AROM (Normal range in degrees) AROM  02/12/2022  Lumbar    Flexion (65) WNL  Extension (30) WNL  Right lateral flexion (25) WNL  Left lateral flexion (25) WNL*  Right rotation (30) WNL  Left rotation (30) WNL         Hip Right Left  Flexion (125) 120 (pain at 90 deg hip flexion along R greater trochanter) WNL  Extension (15)      Abduction (40) WNL WNL  Adduction       Internal Rotation (45)      External Rotation (45) WNL WNL         Ankle      Dorsiflexion (20) WNL WNL  Plantarflexion (50)      Inversion (35)      Eversion (15      (* = pain; Blank rows = not tested)     LE MMT:   MMT (out of 5) Right 02/12/2022 Left 02/12/2022  Hip flexion 4 4*  Hip extension 4- 4-  Hip abduction 4- 4-  Hip adduction      Hip internal rotation      Hip external rotation      Knee flexion 5 5  Knee extension 5 5  Ankle dorsiflexion 4+ 4+  Ankle plantarflexion      Ankle inversion      Ankle eversion      Great toe extension 4+ 4+  (* = pain; Blank rows = not tested)     Sensation Grossly intact to light touch bilateral LEs as determined by testing dermatomes L2-S2. Proprioception, and hot/cold testing deferred on this date.     Reflexes R/L Knee Jerk (L3/4): 2+/2+  Ankle Jerk (S1/2): 3+/3+      Muscle Length Hamstrings: R: Positive, lacking 30 deg L: Positive, lacking 45 deg [place] (quadriceps): R: Positive L: Positive Thomas (hip flexors): R: Not examined L: Not examined        Palpation   Location LEFT  RIGHT           Lumbar paraspinals 1 1  Quadratus Lumborum      Gluteus Maximus  Gluteus Medius   2  Deep hip external rotators      PSIS      Fortin's Area (SIJ)      Greater Trochanter      (Blank rows = not tested) Graded on 0-4 scale (0 = no pain, 1 = pain, 2 = pain with wincing/grimacing/flinching, 3 = pain with withdrawal, 4 = unwilling to allow palpation), (Blank rows = not tested)     Passive Accessory Intervertebral Motion 02/18/22: Hypomobile T3-T10, L3-5 with no pain at restriction     SPECIAL TESTS Lumbar Radiculopathy and Discogenic: Centralization and Peripheralization (SN 92, -LR 0.12): Not examined Slump (SN 83, -LR 0.32): R: Negative L: Positive SLR (SN 92, -LR 0.29): R: Negative L:  Negative Crossed SLR (SP 90): R: Negative L: Negative   Facet Joint: Extension-Rotation (SN 100, -LR 0.0): R: Negative L: Positive   Lumbar Foraminal Stenosis: Lumbar quadrant (SN 70): R: Positive L: Positive          TODAY'S TREATMENT    SUBJECTIVE: Patient repots R anterior hip pain at arrival. Minimal low back/waistline pain at arrival today. Pt reports having to spend ample time sitting when his mother went to the hospital. He reports buckling in his RLE sometimes when stepping in even ground or stepping down from his truck.    PAIN:  Are you having pain? Yes: NPRS scale: 5-6/10 Pain location: minimal pain in lower lumbar region across waistline, pain in R anterior hip      Negative Modified Thomas Test on R hip today     Manual Therapy - for symptom modulation, soft tissue sensitivity and mobility, joint mobility, ROM    STM/DTM and IASTM with Hypervolt bilateral erector spinae L3-L5 x 5 minutes  IASTM with Theraband roller R vastus lateralis and rectus femoris  CPA gr II for pain control, gr III-IV for mobility L3-5 and T3-T10; 2x30 sec at each region for each grade       Therapeutic Exercise - for improved soft tissue  flexibility and extensibility as needed for ROM, improved strength as needed to improve performance of CKC activities/functional movements   Open book; 1 x 10 on each side   Lower trunk rotations; x10 ea dir in hooklying  Alternating hip flexion isotonic with Red Tband; 2x10 alt R/L   Seated hamstring stretch; 2x30 sec            PATIENT EDUCATION:  Education details: see above for patient education details   Person educated: Patient Education method: Explanation, Demonstration, and Handouts Education comprehension: verbalized understanding and returned demonstration     HOME EXERCISE PROGRAM: Access Code [id number]     ASSESSMENT:   CLINICAL IMPRESSION:   Patient has low-level low back pain and lumbar paraspinal pain this morning. Pt had flare-up of R anterior hip pain following prolonged sitting after taking his mom to hospital for diagnostic testing. Pt has negative testing for hip flexor flexibility and minimal sensitivity to palpation along anterior hip/anterior proximal thigh. Utilized manual therapy for desensitization and use of non-weightbearing isotonics to improve tolerance to loading these tissues. Pt does report remaining R anterior hip pain at end of session today, but fortunately his low back/lower lumbar paraspinal pain is minimal. Some manual therapy interventions for R hip are excluded given Hx of bilateral THA. Patient has remaining deficits in thoracic and lumbar spine accessory mobility, postural changes, hip flexor and gluteal weakness, and local nociceptive low back and bilateral paraspinal pain without paresthesias.  Patient will benefit  from continued skilled PT to address above impairments and improve overall function.   REHAB POTENTIAL: Good   CLINICAL DECISION MAKING: Evolving/moderate complexity   EVALUATION COMPLEXITY: Moderate     GOALS:   SHORT TERM GOALS: Target date: 02/26/2022   Pt will be independent with HEP to improve strength and decrease  back pain to improve pain-free function at home and work. Baseline: 02/12/22: Baseline HEP initiated Goal status: INITIAL   Pt will complete walking program at least 3-5 days per week for 10-15 minutes or longer as needed for improved long-term health and to decrease central sensitivity related to chronic pain Baseline: 02/12/22: Initial education given for increase in physical activity and walking program most days of the week.  Goal status: INITIAL     LONG TERM GOALS: Target date: 03/27/2022   Pt will increase FOTO to at least 60 to demonstrate significant improvement in function at home and work related to back pain  Baseline: 02/12/22: 59 (risk-adjusted 46) Goal status: INITIAL   2.  Pt will decrease worst back pain by at least 2 points on the NPRS in order to demonstrate clinically significant reduction in back pain. Baseline: 02/12/22: 7/10 at worst Goal status: INITIAL   3.  Patient will have MMT 4+/5 or greater for all hip muscles tested indicative of improved strength as needed for improved tolerance of performing yard work and tolerance to loading affected tissues for improved daily physical activity tolerance Baseline: 02/12/22: MMTs for bilateral hips 4- to 4/5 (see table above).  Goal status: INITIAL   4.  Patient will be able to sit and perform standing/ambulatory activity > 1 hour as needed for improved ability to participate in social activities and leisure activities as well as complete community outings/errands Baseline: 02/12/22: Pt has to stop sitting at 1 hour due to low back pain.  Goal status: INITIAL     PLAN: PT FREQUENCY: 2x/week   PT DURATION: 8 weeks   PLANNED INTERVENTIONS: Therapeutic exercises, Therapeutic activity, Neuromuscular re-education, Patient/Family education, Dry Needling, Electrical stimulation, Spinal manipulation, Spinal mobilization, Cryotherapy, Moist heat, Traction, and Manual therapy   PLAN FOR NEXT SESSION: Continued with manual  therapy/STM to improve sensitivity of lumbar paraspinals, gluteal musculature, and R anterior hip. Continue with gentle graded movement with gradual introduction of hip strengthening as tolerated.     Valentina Gu, PT, DPT #[id number]  Eilleen Kempf, PT 02/20/2022, 10:39 PM

## 2022-02-26 ENCOUNTER — Ambulatory Visit: Payer: Medicaid Other | Attending: Family Medicine | Admitting: Physical Therapy

## 2022-02-26 ENCOUNTER — Encounter: Payer: Self-pay | Admitting: Physical Therapy

## 2022-02-26 DIAGNOSIS — M6281 Muscle weakness (generalized): Secondary | ICD-10-CM | POA: Diagnosis present

## 2022-02-26 DIAGNOSIS — M5459 Other low back pain: Secondary | ICD-10-CM | POA: Insufficient documentation

## 2022-02-26 DIAGNOSIS — M25552 Pain in left hip: Secondary | ICD-10-CM | POA: Diagnosis present

## 2022-02-26 DIAGNOSIS — M25551 Pain in right hip: Secondary | ICD-10-CM | POA: Insufficient documentation

## 2022-02-26 NOTE — Therapy (Signed)
OUTPATIENT PHYSICAL THERAPY TREATMENT NOTE   Patient Name: Johnny Williams MRN: [id number] DOB:1960-06-27, 61 y.o., male Today's Date: 02/26/2022   END OF SESSION:   PT End of Session - 02/26/22 0842     Visit Number 4    Number of Visits 13    Date for PT Re-Evaluation 03/27/22    Authorization Type [employer], VL based on auth    Progress Note Due on Visit 10    PT Start Time 0843    PT Stop Time 0925    PT Time Calculation (min) 42 min    Activity Tolerance Patient limited by pain;Patient tolerated treatment well    Behavior During Therapy [facility] for tasks assessed/performed             Past Medical History:  Diagnosis Date   Anxiety    Arthritis    Chronic insomnia    COPD (chronic obstructive pulmonary disease) (HCC)    Dyspnea    Esophageal stricture    Esophageal varices (HCC)    GERD (gastroesophageal reflux disease)    Hyperlipidemia    Hypertension    Joint ache    Pre-diabetes    Reflux esophagitis    Sleep apnea    Past Surgical History:  Procedure Laterality Date   COLONOSCOPY WITH PROPOFOL N/A 09/06/2019   Procedure: COLONOSCOPY WITH PROPOFOL;  Surgeon: Lucilla Lame, MD;  Location: [facility] ENDOSCOPY;  Service: Endoscopy;  Laterality: N/A;   ESOPHAGOGASTRODUODENOSCOPY (EGD) WITH PROPOFOL N/A 09/06/2019   Procedure: ESOPHAGOGASTRODUODENOSCOPY (EGD) WITH PROPOFOL;  Surgeon: Lucilla Lame, MD;  Location: [facility] ENDOSCOPY;  Service: Endoscopy;  Laterality: N/A;   TOTAL HIP ARTHROPLASTY Right 05/19/2018   Procedure: TOTAL HIP ARTHROPLASTY;  Surgeon: Dereck Leep, MD;  Location: [facility] ORS;  Service: Orthopedics;  Laterality: Right;   TOTAL HIP ARTHROPLASTY Left 11/16/2019   Procedure: TOTAL HIP ARTHROPLASTY;  Surgeon: Dereck Leep, MD;  Location: [facility] ORS;  Service: Orthopedics;  Laterality: Left;  REQUESTING 3HRS   Patient Active Problem List   Diagnosis Date Noted   Gastroesophageal reflux disease with esophagitis    Screen for colon cancer    Polyp  of sigmoid colon    Erythrocytosis 07/29/2019   2-vessel coronary artery disease 07/07/2019   Aortic atherosclerosis ([place]) 07/07/2019   Cholelithiasis 07/07/2019   Obesity (BMI 35.0-39.9 without comorbidity) 07/07/2019   Vitamin B12 deficiency 07/07/2019   H/O total hip arthroplasty 05/19/2018   Left lumbar radiculitis 11/09/2017   DDD (degenerative disc disease), lumbosacral 11/09/2017   Vitamin D insufficiency 10/12/2017   Osteoarthritis of hip (Bilateral) (L>R) 10/12/2017   Lumbar facet syndrome (Bilateral) (L>R) 10/12/2017   Anxiety 09/29/2017   Chronic insomnia 09/29/2017   Cluster headaches 09/29/2017   COPD (chronic obstructive pulmonary disease) ([place]) 09/29/2017   Esophageal varices ([place]) 09/29/2017   Essential hypertension 09/29/2017   Sleep apnea 09/29/2017   Smoker 09/29/2017   Stricture of esophagus 09/29/2017   Type 2 diabetes mellitus without complication ([place]) 02/63/7858   Chronic low back pain (Secondary Area of Pain) (Bilateral) with sciatica (Left) 09/29/2017   Chronic lower extremity pain (Tertiary Area of Pain) (Bilateral) (L>R) 09/29/2017   Chronic hip pain (Primary Area of Pain) (Bilateral) (L>R) 09/29/2017   Chronic knee pain (Fourth Area of Pain) (Bilateral) (L>R) 09/29/2017   Chronic pain syndrome 09/29/2017   Pharmacologic therapy 09/29/2017   Disorder of skeletal system 09/29/2017   Problems influencing health status 09/29/2017   High risk medication use 05/11/2014   Gastritis and duodenitis 11/07/2013  Hyperlipidemia, unspecified 11/07/2013   Variants of migraine, not elsewhere classified, with intractable migraine, so stated, without mention of status migrainosus 11/07/2013        PCP: [facility], [place] REFERRING PROVIDER: Sherren Kerns Meeler, FNP   REFERRING DIAG:  Q65.784 (ICD-10-CM) - Spinal stenosis, lumbar region with neurogenic claudication  M54.16 (ICD-10-CM) - Radiculopathy, lumbar region  M51.36 (ICD-10-CM) - Other  intervertebral disc degeneration, lumbar region      THERAPY DIAG:  Other low back pain   Pain in right hip   Pain in left hip   Muscle weakness (generalized)   Rationale for Evaluation and Treatment Rehabilitation   PERTINENT HISTORY: Patient is a 61 year old male with primary complaint of low back pain. Pt reports losing his job back in 2010. He reports in 2015, he started having issues with his legs and back. Pt reports pulling something in R anterolateral hip when walking on treadmill. Pt reports having therapy at [facility] and having alleviation of R hip/leg pain with working on his spine. Hx of bilateral THA; pt has remaining pain in his R hip s/p THA from 2019. 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 He denies paresthesias in lower limbs; he does report numbness in R hip. He reports constant pain in his R hip. He reports he is sedentary and watches TV during much of the day. He had hx of ESI that did alleviate his symptoms; however, he reports his pain returned the following morning.    Pain:  Pain Intensity: Present: 5/10, Best: 0/10, Worst: 7/10 Pain location: Axial low back and R paraspinal, R lateral thigh/lateral hip Pain Quality: constant in R hip, sharp pain in low back  Numbness/Tingling: Yes, R hip Focal Weakness: Yes, "muscles in legs have gone to sleep, because I'm not as active as I used to be"  Aggravating factors: prolonged sitting, sedentary activity, cold weather  Relieving factors: on the move 24-hour pain behavior: worse in AM  How long can you sit: 1 hour History of prior back injury, pain, surgery, or therapy: Yes, hx bilat THA Falls: Has patient fallen in last 6 months? No Follow-up appointment with MD: Yes, f/u with Whitney Meeler, date unknown   Imaging: Yes    MRI Lumbar Spine 10/12/21 Disc spaces: Degenerative disease with disc height loss throughout the thoracolumbar spine.    T12-L1: Mild broad-based disc bulge. No foraminal or central canal stenosis.   L1-L2: Mild broad-based disc bulge. No foraminal or central canal stenosis.   L2-L3: Mild broad-based disc bulge flattening the ventral thecal sac. Mild bilateral facet arthropathy. Mild spinal stenosis. No significant foraminal or central canal stenosis.   L3-L4: Broad-based disc bulge flattening ventral thecal sac. Mild bilateral facet arthropathy. Moderate spinal stenosis. Bilateral subarticular recess stenosis. Moderate bilateral foraminal stenosis.   L4-L5: Broad-based disc bulge. Mild bilateral facet arthropathy. Bilateral subarticular recess stenosis. Moderate bilateral foraminal stenosis, right greater than left. Mild spinal stenosis.   L5-S1: Broad-based disc osteophyte complex. Mild bilateral facet arthropathy. Moderate left foraminal stenosis. Mild right foraminal stenosis. No spinal stenosis.   IMPRESSION: 1. Diffuse lumbar spine spondylosis as described above. 2. No acute osseous injury of the lumbar spine.     By: Kathreen Devoid M.D.   On: 10/12/2021 08:46     Prior level of function: Independent Occupational demands: Pt on disability  Hobbies: yard work, fishing, Probation officer flags (  bowel/bladder changes, saddle paresthesia, personal history of cancer, h/o spinal tumors, h/o compression fx, h/o abdominal aneurysm, abdominal pain, chills/fever, night sweats, nausea, vomiting, unrelenting pain, first onset of insidious LBP <20 y/o): Negative     Precautions: None   Weight Bearing Restrictions: No   Living Environment Lives with: lives with their family, lives with 60 year old mother Lives in: Mobile home   Patient Goals: Improved pain     PRECAUTIONS: None     OBJECTIVE: (objective measures completed at initial evaluation unless otherwise dated)     Patient Surveys  FOTO 28 (risk-adjusted 29), predicted outcome score of 60     Cognition Patient is oriented to  person, place, and time.  Recent memory is intact.  Remote memory is intact.  Attention span and concentration are intact.  Expressive speech is intact.  Patient's fund of knowledge is within normal limits for educational level.                            Gross Musculoskeletal Assessment Tremor: None Bulk: Normal Tone: Normal No visible step-off along spinal column, no signs of scoliosis     GAIT: Forward flexed posture, mild guarded posture with dec arm swing and trunk rotation       AROM          AROM (Normal range in degrees) AROM  02/12/2022  Lumbar    Flexion (65) WNL  Extension (30) WNL  Right lateral flexion (25) WNL  Left lateral flexion (25) WNL*  Right rotation (30) WNL  Left rotation (30) WNL         Hip Right Left  Flexion (125) 120 (pain at 90 deg hip flexion along R greater trochanter) WNL  Extension (15)      Abduction (40) WNL WNL  Adduction       Internal Rotation (45)      External Rotation (45) WNL WNL         Ankle      Dorsiflexion (20) WNL WNL  Plantarflexion (50)      Inversion (35)      Eversion (15      (* = pain; Blank rows = not tested)     LE MMT:   MMT (out of 5) Right 02/12/2022 Left 02/12/2022  Hip flexion 4 4*  Hip extension 4- 4-  Hip abduction 4- 4-  Hip adduction      Hip internal rotation      Hip external rotation      Knee flexion 5 5  Knee extension 5 5  Ankle dorsiflexion 4+ 4+  Ankle plantarflexion      Ankle inversion      Ankle eversion      Great toe extension 4+ 4+  (* = pain; Blank rows = not tested)     Sensation Grossly intact to light touch bilateral LEs as determined by testing dermatomes L2-S2. Proprioception, and hot/cold testing deferred on this date.     Reflexes R/L Knee Jerk (L3/4): 2+/2+  Ankle Jerk (S1/2): 3+/3+      Muscle Length Hamstrings: R: Positive, lacking 30 deg L: Positive, lacking 45 deg [place] (quadriceps): R: Positive L: Positive Thomas (hip flexors): R: Not examined L:  Not examined       Palpation   Location LEFT  RIGHT           Lumbar paraspinals 1 1  Quadratus Lumborum      Gluteus Maximus  Gluteus Medius   2  Deep hip external rotators      PSIS      Fortin's Area (SIJ)      Greater Trochanter      (Blank rows = not tested) Graded on 0-4 scale (0 = no pain, 1 = pain, 2 = pain with wincing/grimacing/flinching, 3 = pain with withdrawal, 4 = unwilling to allow palpation), (Blank rows = not tested)     Passive Accessory Intervertebral Motion 02/18/22: Hypomobile T3-T10, L3-5 with no pain at restriction     SPECIAL TESTS Lumbar Radiculopathy and Discogenic: Centralization and Peripheralization (SN 92, -LR 0.12): Not examined Slump (SN 83, -LR 0.32): R: Negative L: Positive SLR (SN 92, -LR 0.29): R: Negative L:  Negative Crossed SLR (SP 90): R: Negative L: Negative   Facet Joint: Extension-Rotation (SN 100, -LR 0.0): R: Negative L: Positive   Lumbar Foraminal Stenosis: Lumbar quadrant (SN 70): R: Positive L: Positive           TODAY'S TREATMENT    SUBJECTIVE: Patient reports mild pain affecting his low back at arrival. Pain affecting R side of his low back at arrival to PT. Patient reports some pain into his R anterior thigh. Pt reports increasing his volume of walking with yard work outside of clinic.     PAIN:  Are you having pain? Yes: NPRS scale: 3/10 Pain location: pain in lower lumbar region across waistline with noted "knot,"  pain in R anterior hip          Manual Therapy - for symptom modulation, soft tissue sensitivity and mobility, joint mobility, ROM    STM/DTM and IASTM with Hypervolt R>L erector spinae L3-L5 and R QL; x 15 minutes    CPA gr II for pain control, gr III-IV for mobility L3-5 and T3-T10; 2x30 sec at each region for each grade   *not today* IASTM with Theraband roller R vastus lateralis and rectus femoris       Therapeutic Exercise - for improved soft tissue flexibility and extensibility as  needed for ROM, improved strength as needed to improve performance of CKC activities/functional movements  Repeated flexion in sitting; 2x10   -no effect during, remaining R flank pain and no R thigh pain after   Lower trunk rotations; x10 ea dir in hooklying   Alternating hip flexion isotonic with Green Tband; 2x10 alt R/L  Bridge with Blue band around knees for hip abduction moment; 2x10    *not today* Open book; 1 x 10 on each side Seated hamstring stretch; 2x30 sec              PATIENT EDUCATION:  Education details: see above for patient education details   Person educated: Patient Education method: Explanation, Demonstration, and Handouts Education comprehension: verbalized understanding and returned demonstration     HOME EXERCISE PROGRAM: Access Code [id number]     ASSESSMENT:   CLINICAL IMPRESSION:   Patient has moderate pain affecting R lumbar flank and lower lumbar paraspinal region at arrival to therapy as well as R proximal anterior thigh. Thigh pain is alleviated with repeated flexion in sitting; this could reflect relief from non-weightbearing position, but pt does tolerate ambulation well following repeated movement without notable thigh pain. Pt has palpable TrP along R iliocostalis lumborum at L4-5 level and R QL with sensitivity moderately improved with DTM and manual therapy today. Pt does have remaining sensation of "knot" in this region post-treatment. Pt has reported improvement with PT to date and will benefit  from addition of repeated movement at home to maintain progress between visits - will assess next visit for response to home program. Patient has remaining deficits in thoracic and lumbar spine accessory mobility, postural changes, hip flexor and gluteal weakness, and local nociceptive low back and bilateral paraspinal pain without paresthesias.  Patient will benefit from continued skilled PT to address above impairments and improve overall function.    REHAB POTENTIAL: Good   CLINICAL DECISION MAKING: Evolving/moderate complexity   EVALUATION COMPLEXITY: Moderate     GOALS:   SHORT TERM GOALS: Target date: 02/26/2022   Pt will be independent with HEP to improve strength and decrease back pain to improve pain-free function at home and work. Baseline: 02/12/22: Baseline HEP initiated Goal status: INITIAL   Pt will complete walking program at least 3-5 days per week for 10-15 minutes or longer as needed for improved long-term health and to decrease central sensitivity related to chronic pain Baseline: 02/12/22: Initial education given for increase in physical activity and walking program most days of the week.  Goal status: INITIAL     LONG TERM GOALS: Target date: 03/27/2022   Pt will increase FOTO to at least 60 to demonstrate significant improvement in function at home and work related to back pain  Baseline: 02/12/22: 59 (risk-adjusted 46) Goal status: INITIAL   2.  Pt will decrease worst back pain by at least 2 points on the NPRS in order to demonstrate clinically significant reduction in back pain. Baseline: 02/12/22: 7/10 at worst Goal status: INITIAL   3.  Patient will have MMT 4+/5 or greater for all hip muscles tested indicative of improved strength as needed for improved tolerance of performing yard work and tolerance to loading affected tissues for improved daily physical activity tolerance Baseline: 02/12/22: MMTs for bilateral hips 4- to 4/5 (see table above).  Goal status: INITIAL   4.  Patient will be able to sit and perform standing/ambulatory activity > 1 hour as needed for improved ability to participate in social activities and leisure activities as well as complete community outings/errands Baseline: 02/12/22: Pt has to stop sitting at 1 hour due to low back pain.  Goal status: INITIAL     PLAN: PT FREQUENCY: 2x/week   PT DURATION: 8 weeks   PLANNED INTERVENTIONS: Therapeutic exercises, Therapeutic activity,  Neuromuscular re-education, Patient/Family education, Dry Needling, Electrical stimulation, Spinal manipulation, Spinal mobilization, Cryotherapy, Moist heat, Traction, and Manual therapy   PLAN FOR NEXT SESSION: Continued with manual therapy/STM to improve sensitivity of lumbar paraspinals, gluteal musculature, and R anterior hip. Continue with gentle graded movement with gradual introduction of hip strengthening as tolerated.       Valentina Gu, PT, DPT #[id number]  Eilleen Kempf, PT 02/26/2022, 8:42 AM

## 2022-03-03 ENCOUNTER — Ambulatory Visit: Payer: Medicaid Other | Admitting: Physical Therapy

## 2022-03-03 ENCOUNTER — Encounter: Payer: Self-pay | Admitting: Physical Therapy

## 2022-03-03 DIAGNOSIS — M25551 Pain in right hip: Secondary | ICD-10-CM

## 2022-03-03 DIAGNOSIS — M6281 Muscle weakness (generalized): Secondary | ICD-10-CM

## 2022-03-03 DIAGNOSIS — M5459 Other low back pain: Secondary | ICD-10-CM

## 2022-03-03 DIAGNOSIS — M25552 Pain in left hip: Secondary | ICD-10-CM

## 2022-03-03 NOTE — Therapy (Signed)
OUTPATIENT PHYSICAL THERAPY TREATMENT NOTE   Patient Name: Johnny Williams MRN: [id number] DOB:1960/12/15, 61 y.o., male Today's Date: 03/03/2022   END OF SESSION:   PT End of Session - 03/03/22 0834     Visit Number 5    Number of Visits 13    Date for PT Re-Evaluation 03/27/22    Authorization Type [employer], VL based on auth    Progress Note Due on Visit 10    PT Start Time 0836    PT Stop Time 0927    PT Time Calculation (min) 51 min    Activity Tolerance Patient limited by pain;Patient tolerated treatment well    Behavior During Therapy [facility] for tasks assessed/performed              Past Medical History:  Diagnosis Date   Anxiety    Arthritis    Chronic insomnia    COPD (chronic obstructive pulmonary disease) (HCC)    Dyspnea    Esophageal stricture    Esophageal varices (HCC)    GERD (gastroesophageal reflux disease)    Hyperlipidemia    Hypertension    Joint ache    Pre-diabetes    Reflux esophagitis    Sleep apnea    Past Surgical History:  Procedure Laterality Date   COLONOSCOPY WITH PROPOFOL N/A 09/06/2019   Procedure: COLONOSCOPY WITH PROPOFOL;  Surgeon: Lucilla Lame, MD;  Location: [facility] ENDOSCOPY;  Service: Endoscopy;  Laterality: N/A;   ESOPHAGOGASTRODUODENOSCOPY (EGD) WITH PROPOFOL N/A 09/06/2019   Procedure: ESOPHAGOGASTRODUODENOSCOPY (EGD) WITH PROPOFOL;  Surgeon: Lucilla Lame, MD;  Location: [facility] ENDOSCOPY;  Service: Endoscopy;  Laterality: N/A;   TOTAL HIP ARTHROPLASTY Right 05/19/2018   Procedure: TOTAL HIP ARTHROPLASTY;  Surgeon: Dereck Leep, MD;  Location: [facility] ORS;  Service: Orthopedics;  Laterality: Right;   TOTAL HIP ARTHROPLASTY Left 11/16/2019   Procedure: TOTAL HIP ARTHROPLASTY;  Surgeon: Dereck Leep, MD;  Location: [facility] ORS;  Service: Orthopedics;  Laterality: Left;  REQUESTING 3HRS   Patient Active Problem List   Diagnosis Date Noted   Gastroesophageal reflux disease with esophagitis    Screen for colon cancer     Polyp of sigmoid colon    Erythrocytosis 07/29/2019   2-vessel coronary artery disease 07/07/2019   Aortic atherosclerosis ([place]) 07/07/2019   Cholelithiasis 07/07/2019   Obesity (BMI 35.0-39.9 without comorbidity) 07/07/2019   Vitamin B12 deficiency 07/07/2019   H/O total hip arthroplasty 05/19/2018   Left lumbar radiculitis 11/09/2017   DDD (degenerative disc disease), lumbosacral 11/09/2017   Vitamin D insufficiency 10/12/2017   Osteoarthritis of hip (Bilateral) (L>R) 10/12/2017   Lumbar facet syndrome (Bilateral) (L>R) 10/12/2017   Anxiety 09/29/2017   Chronic insomnia 09/29/2017   Cluster headaches 09/29/2017   COPD (chronic obstructive pulmonary disease) ([place]) 09/29/2017   Esophageal varices ([place]) 09/29/2017   Essential hypertension 09/29/2017   Sleep apnea 09/29/2017   Smoker 09/29/2017   Stricture of esophagus 09/29/2017   Type 2 diabetes mellitus without complication ([place]) 41/66/0630   Chronic low back pain (Secondary Area of Pain) (Bilateral) with sciatica (Left) 09/29/2017   Chronic lower extremity pain (Tertiary Area of Pain) (Bilateral) (L>R) 09/29/2017   Chronic hip pain (Primary Area of Pain) (Bilateral) (L>R) 09/29/2017   Chronic knee pain (Fourth Area of Pain) (Bilateral) (L>R) 09/29/2017   Chronic pain syndrome 09/29/2017   Pharmacologic therapy 09/29/2017   Disorder of skeletal system 09/29/2017   Problems influencing health status 09/29/2017   High risk medication use 05/11/2014   Gastritis and duodenitis  11/07/2013   Hyperlipidemia, unspecified 11/07/2013   Variants of migraine, not elsewhere classified, with intractable migraine, so stated, without mention of status migrainosus 11/07/2013        PCP: [facility], [place] REFERRING PROVIDER: Sherren Kerns Meeler, FNP   REFERRING DIAG:  [id number] (ICD-10-CM) - Spinal stenosis, lumbar region with neurogenic claudication  M54.16 (ICD-10-CM) - Radiculopathy, lumbar region  M51.36 (ICD-10-CM) - Other  intervertebral disc degeneration, lumbar region      THERAPY DIAG:  Other low back pain   Pain in right hip   Pain in left hip   Muscle weakness (generalized)   Rationale for Evaluation and Treatment Rehabilitation   PERTINENT HISTORY: Patient is a 61 year old male with primary complaint of low back pain. Pt reports losing his job back in 2010. He reports in 2015, he started having issues with his legs and back. Pt reports pulling something in R anterolateral hip when walking on treadmill. Pt reports having therapy at [facility] and having alleviation of R hip/leg pain with working on his spine. Hx of bilateral THA; pt has remaining pain in his R hip s/p THA from 2019. 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 He denies paresthesias in lower limbs; he does report numbness in R hip. He reports constant pain in his R hip. He reports he is sedentary and watches TV during much of the day. He had hx of ESI that did alleviate his symptoms; however, he reports his pain returned the following morning.    Pain:  Pain Intensity: Present: 5/10, Best: 0/10, Worst: 7/10 Pain location: Axial low back and R paraspinal, R lateral thigh/lateral hip Pain Quality: constant in R hip, sharp pain in low back  Numbness/Tingling: Yes, R hip Focal Weakness: Yes, "muscles in legs have gone to sleep, because I'm not as active as I used to be"  Aggravating factors: prolonged sitting, sedentary activity, cold weather  Relieving factors: on the move 24-hour pain behavior: worse in AM  How long can you sit: 1 hour History of prior back injury, pain, surgery, or therapy: Yes, hx bilat THA Falls: Has patient fallen in last 6 months? No Follow-up appointment with MD: Yes, f/u with Whitney Meeler, date unknown   Imaging: Yes    MRI Lumbar Spine 10/12/21 Disc spaces: Degenerative disease with disc height loss throughout the thoracolumbar spine.    T12-L1: Mild broad-based disc bulge. No foraminal or central canal stenosis.   L1-L2: Mild broad-based disc bulge. No foraminal or central canal stenosis.   L2-L3: Mild broad-based disc bulge flattening the ventral thecal sac. Mild bilateral facet arthropathy. Mild spinal stenosis. No significant foraminal or central canal stenosis.   L3-L4: Broad-based disc bulge flattening ventral thecal sac. Mild bilateral facet arthropathy. Moderate spinal stenosis. Bilateral subarticular recess stenosis. Moderate bilateral foraminal stenosis.   L4-L5: Broad-based disc bulge. Mild bilateral facet arthropathy. Bilateral subarticular recess stenosis. Moderate bilateral foraminal stenosis, right greater than left. Mild spinal stenosis.   L5-S1: Broad-based disc osteophyte complex. Mild bilateral facet arthropathy. Moderate left foraminal stenosis. Mild right foraminal stenosis. No spinal stenosis.   IMPRESSION: 1. Diffuse lumbar spine spondylosis as described above. 2. No acute osseous injury of the lumbar spine.     By: Kathreen Devoid M.D.   On: 10/12/2021 08:46     Prior level of function: Independent Occupational demands: Pt on disability  Hobbies: yard work, fishing, Location manager  Red flags (bowel/bladder changes, saddle paresthesia, personal history of cancer, h/o spinal tumors, h/o compression fx, h/o abdominal aneurysm, abdominal pain, chills/fever, night sweats, nausea, vomiting, unrelenting pain, first onset of insidious LBP <20 y/o): Negative     Precautions: None   Weight Bearing Restrictions: No   Living Environment Lives with: lives with their family, lives with 8 year old mother Lives in: Mobile home   Patient Goals: Improved pain     PRECAUTIONS: None     OBJECTIVE: (objective measures completed at initial evaluation unless otherwise dated)     Patient Surveys  FOTO 67 (risk-adjusted 3), predicted outcome score of 60     Cognition Patient is oriented to  person, place, and time.  Recent memory is intact.  Remote memory is intact.  Attention span and concentration are intact.  Expressive speech is intact.  Patient's fund of knowledge is within normal limits for educational level.                            Gross Musculoskeletal Assessment Tremor: None Bulk: Normal Tone: Normal No visible step-off along spinal column, no signs of scoliosis     GAIT: Forward flexed posture, mild guarded posture with dec arm swing and trunk rotation       AROM          AROM (Normal range in degrees) AROM  02/12/2022  Lumbar    Flexion (65) WNL  Extension (30) WNL  Right lateral flexion (25) WNL  Left lateral flexion (25) WNL*  Right rotation (30) WNL  Left rotation (30) WNL         Hip Right Left  Flexion (125) 120 (pain at 90 deg hip flexion along R greater trochanter) WNL  Extension (15)      Abduction (40) WNL WNL  Adduction       Internal Rotation (45)      External Rotation (45) WNL WNL         Ankle      Dorsiflexion (20) WNL WNL  Plantarflexion (50)      Inversion (35)      Eversion (15      (* = pain; Blank rows = not tested)     LE MMT:   MMT (out of 5) Right 02/12/2022 Left 02/12/2022  Hip flexion 4 4*  Hip extension 4- 4-  Hip abduction 4- 4-  Hip adduction      Hip internal rotation      Hip external rotation      Knee flexion 5 5  Knee extension 5 5  Ankle dorsiflexion 4+ 4+  Ankle plantarflexion      Ankle inversion      Ankle eversion      Great toe extension 4+ 4+  (* = pain; Blank rows = not tested)     Sensation Grossly intact to light touch bilateral LEs as determined by testing dermatomes L2-S2. Proprioception, and hot/cold testing deferred on this date.     Reflexes R/L Knee Jerk (L3/4): 2+/2+  Ankle Jerk (S1/2): 3+/3+      Muscle Length Hamstrings: R: Positive, lacking 30 deg L: Positive, lacking 45 deg [place] (quadriceps): R: Positive L: Positive Thomas (hip flexors): R: Not examined L:  Not examined       Palpation   Location LEFT  RIGHT           Lumbar paraspinals 1 1  Quadratus Lumborum  Gluteus Maximus      Gluteus Medius   2  Deep hip external rotators      PSIS      Fortin's Area (SIJ)      Greater Trochanter      (Blank rows = not tested) Graded on 0-4 scale (0 = no pain, 1 = pain, 2 = pain with wincing/grimacing/flinching, 3 = pain with withdrawal, 4 = unwilling to allow palpation), (Blank rows = not tested)     Passive Accessory Intervertebral Motion 02/18/22: Hypomobile T3-T10, L3-5 with no pain at restriction     SPECIAL TESTS Lumbar Radiculopathy and Discogenic: Centralization and Peripheralization (SN 92, -LR 0.12): Not examined Slump (SN 83, -LR 0.32): R: Negative L: Positive SLR (SN 92, -LR 0.29): R: Negative L:  Negative Crossed SLR (SP 90): R: Negative L: Negative   Facet Joint: Extension-Rotation (SN 100, -LR 0.0): R: Negative L: Positive   Lumbar Foraminal Stenosis: Lumbar quadrant (SN 70): R: Positive L: Positive           TODAY'S TREATMENT    SUBJECTIVE: Patient reports pain with performing sit to stand this past Wednesday after PT and going to get his water pump for this truck. Pt reports doing fairly well over the weekend. Pt reports compliance with his HEP. Pt repots mild pain in low back and along R groin. He reports compliance with HEP.     PAIN:  Are you having pain? Yes: NPRS scale: 4/10 Pain location: pain in lower lumbar region across waistline with noted "knot,"  pain in R anterior hip          Manual Therapy - for symptom modulation, soft tissue sensitivity and mobility, joint mobility, ROM    Performed in prone with 2 pillows under pelvis to limit lumbar extension:  STM/DTM and IASTM with Theraband roller R>L erector spinae L3-L5 and R QL; x 15 minutes    CPA gr II for pain control, gr III-IV for mobility L3-5 and T3-T10; 2x30 sec at each region for each grade   *not today* IASTM with Theraband roller R  vastus lateralis and rectus femoris       Therapeutic Exercise - for improved soft tissue flexibility and extensibility as needed for ROM, improved strength as needed to improve performance of CKC activities/functional movements  Repeated flexion in sitting; 1x10   -no effect during, remaining R flank pain and no R thigh pain after   Lower trunk rotations; with QL bias (figure-4 position for RLE x 10, then LLE x10)  Posterior pelvic tilt, hooklying, heavy verbal and tactile cueing, demonstration for technique; 2x10   Alternating hip flexion isotonic with Green Tband; 2x10 alt R/L  Bridge with Black band around knees for hip abduction moment; 2x10    Pt edu: HEP update and review   *not today* Open book; 1 x 10 on each side Seated hamstring stretch; 2x30 sec              PATIENT EDUCATION:  Education details: see above for patient education details   Person educated: Patient Education method: Explanation, Demonstration, and Handouts Education comprehension: verbalized understanding and returned demonstration     HOME EXERCISE PROGRAM: Access Code [id number]     ASSESSMENT:   CLINICAL IMPRESSION:   Patient has minimal R anterior thigh pain following repeated flexion in sitting. Utilized repeated flexion as screen originally to determine if patient would respond to flexion or if he needs to use extension instead. Patient does seem to respond well  with repeated flexion - updated HEP for regular repeated flexion to maintain progress with PT. Pt tolerates supine hip strengthening drills well without notable pain. Patient has remaining deficits in thoracic and lumbar spine accessory mobility, postural changes, hip flexor and gluteal weakness, and local nociceptive low back and bilateral paraspinal pain without paresthesias.  Patient will benefit from continued skilled PT to address above impairments and improve overall function.   REHAB POTENTIAL: Good   CLINICAL DECISION  MAKING: Evolving/moderate complexity   EVALUATION COMPLEXITY: Moderate     GOALS:   SHORT TERM GOALS: Target date: 02/26/2022   Pt will be independent with HEP to improve strength and decrease back pain to improve pain-free function at home and work. Baseline: 02/12/22: Baseline HEP initiated Goal status: INITIAL   Pt will complete walking program at least 3-5 days per week for 10-15 minutes or longer as needed for improved long-term health and to decrease central sensitivity related to chronic pain Baseline: 02/12/22: Initial education given for increase in physical activity and walking program most days of the week.  Goal status: INITIAL     LONG TERM GOALS: Target date: 03/27/2022   Pt will increase FOTO to at least 60 to demonstrate significant improvement in function at home and work related to back pain  Baseline: 02/12/22: 59 (risk-adjusted 46) Goal status: INITIAL   2.  Pt will decrease worst back pain by at least 2 points on the NPRS in order to demonstrate clinically significant reduction in back pain. Baseline: 02/12/22: 7/10 at worst Goal status: INITIAL   3.  Patient will have MMT 4+/5 or greater for all hip muscles tested indicative of improved strength as needed for improved tolerance of performing yard work and tolerance to loading affected tissues for improved daily physical activity tolerance Baseline: 02/12/22: MMTs for bilateral hips 4- to 4/5 (see table above).  Goal status: INITIAL   4.  Patient will be able to sit and perform standing/ambulatory activity > 1 hour as needed for improved ability to participate in social activities and leisure activities as well as complete community outings/errands Baseline: 02/12/22: Pt has to stop sitting at 1 hour due to low back pain.  Goal status: INITIAL     PLAN: PT FREQUENCY: 2x/week   PT DURATION: 8 weeks   PLANNED INTERVENTIONS: Therapeutic exercises, Therapeutic activity, Neuromuscular re-education, Patient/Family  education, Dry Needling, Electrical stimulation, Spinal manipulation, Spinal mobilization, Cryotherapy, Moist heat, Traction, and Manual therapy   PLAN FOR NEXT SESSION: Continued with manual therapy/STM to improve sensitivity of lumbar paraspinals, gluteal musculature, and R anterior hip. Continue with gentle graded movement with gradual introduction of hip strengthening as tolerated.       Valentina Gu, PT, DPT #[id number]  Eilleen Kempf, PT 03/03/2022, 9:35 AM

## 2022-03-05 ENCOUNTER — Ambulatory Visit: Payer: Medicaid Other | Admitting: Physical Therapy

## 2022-03-05 ENCOUNTER — Encounter: Payer: Self-pay | Admitting: Physical Therapy

## 2022-03-05 DIAGNOSIS — M25551 Pain in right hip: Secondary | ICD-10-CM

## 2022-03-05 DIAGNOSIS — M25552 Pain in left hip: Secondary | ICD-10-CM

## 2022-03-05 DIAGNOSIS — M5459 Other low back pain: Secondary | ICD-10-CM

## 2022-03-05 DIAGNOSIS — M6281 Muscle weakness (generalized): Secondary | ICD-10-CM

## 2022-03-05 NOTE — Therapy (Signed)
OUTPATIENT PHYSICAL THERAPY TREATMENT NOTE   Patient Name: Johnny Williams MRN: [id number] DOB:1960/09/16, 61 y.o., male Today's Date: 03/06/2022   END OF SESSION:   PT End of Session - 03/05/22 0853     Visit Number 6    Number of Visits 13    Date for PT Re-Evaluation 03/27/22    Authorization Type [employer], VL based on auth    Progress Note Due on Visit 10    PT Start Time 0848    PT Stop Time 0929    PT Time Calculation (min) 41 min    Activity Tolerance Patient limited by pain;Patient tolerated treatment well    Behavior During Therapy [facility] for tasks assessed/performed               Past Medical History:  Diagnosis Date   Anxiety    Arthritis    Chronic insomnia    COPD (chronic obstructive pulmonary disease) (HCC)    Dyspnea    Esophageal stricture    Esophageal varices (HCC)    GERD (gastroesophageal reflux disease)    Hyperlipidemia    Hypertension    Joint ache    Pre-diabetes    Reflux esophagitis    Sleep apnea    Past Surgical History:  Procedure Laterality Date   COLONOSCOPY WITH PROPOFOL N/A 09/06/2019   Procedure: COLONOSCOPY WITH PROPOFOL;  Surgeon: Lucilla Lame, MD;  Location: [facility] ENDOSCOPY;  Service: Endoscopy;  Laterality: N/A;   ESOPHAGOGASTRODUODENOSCOPY (EGD) WITH PROPOFOL N/A 09/06/2019   Procedure: ESOPHAGOGASTRODUODENOSCOPY (EGD) WITH PROPOFOL;  Surgeon: Lucilla Lame, MD;  Location: [facility] ENDOSCOPY;  Service: Endoscopy;  Laterality: N/A;   TOTAL HIP ARTHROPLASTY Right 05/19/2018   Procedure: TOTAL HIP ARTHROPLASTY;  Surgeon: Dereck Leep, MD;  Location: [facility] ORS;  Service: Orthopedics;  Laterality: Right;   TOTAL HIP ARTHROPLASTY Left 11/16/2019   Procedure: TOTAL HIP ARTHROPLASTY;  Surgeon: Dereck Leep, MD;  Location: [facility] ORS;  Service: Orthopedics;  Laterality: Left;  REQUESTING 3HRS   Patient Active Problem List   Diagnosis Date Noted   Gastroesophageal reflux disease with esophagitis    Screen for colon cancer     Polyp of sigmoid colon    Erythrocytosis 07/29/2019   2-vessel coronary artery disease 07/07/2019   Aortic atherosclerosis ([place]) 07/07/2019   Cholelithiasis 07/07/2019   Obesity (BMI 35.0-39.9 without comorbidity) 07/07/2019   Vitamin B12 deficiency 07/07/2019   H/O total hip arthroplasty 05/19/2018   Left lumbar radiculitis 11/09/2017   DDD (degenerative disc disease), lumbosacral 11/09/2017   Vitamin D insufficiency 10/12/2017   Osteoarthritis of hip (Bilateral) (L>R) 10/12/2017   Lumbar facet syndrome (Bilateral) (L>R) 10/12/2017   Anxiety 09/29/2017   Chronic insomnia 09/29/2017   Cluster headaches 09/29/2017   COPD (chronic obstructive pulmonary disease) ([place]) 09/29/2017   Esophageal varices ([place]) 09/29/2017   Essential hypertension 09/29/2017   Sleep apnea 09/29/2017   Smoker 09/29/2017   Stricture of esophagus 09/29/2017   Type 2 diabetes mellitus without complication ([place]) 73/22/0254   Chronic low back pain (Secondary Area of Pain) (Bilateral) with sciatica (Left) 09/29/2017   Chronic lower extremity pain (Tertiary Area of Pain) (Bilateral) (L>R) 09/29/2017   Chronic hip pain (Primary Area of Pain) (Bilateral) (L>R) 09/29/2017   Chronic knee pain (Fourth Area of Pain) (Bilateral) (L>R) 09/29/2017   Chronic pain syndrome 09/29/2017   Pharmacologic therapy 09/29/2017   Disorder of skeletal system 09/29/2017   Problems influencing health status 09/29/2017   High risk medication use 05/11/2014   Gastritis and  duodenitis 11/07/2013   Hyperlipidemia, unspecified 11/07/2013   Variants of migraine, not elsewhere classified, with intractable migraine, so stated, without mention of status migrainosus 11/07/2013        PCP: [facility], [place] REFERRING PROVIDER: Sherren Kerns Meeler, FNP   REFERRING DIAG:  [phone] (ICD-10-CM) - Spinal stenosis, lumbar region with neurogenic claudication  M54.16 (ICD-10-CM) - Radiculopathy, lumbar region  M51.36 (ICD-10-CM) - Other  intervertebral disc degeneration, lumbar region      THERAPY DIAG:  Other low back pain   Pain in right hip   Pain in left hip   Muscle weakness (generalized)   Rationale for Evaluation and Treatment Rehabilitation   PERTINENT HISTORY: Patient is a 61 year old male with primary complaint of low back pain. Pt reports losing his job back in 2010. He reports in 2015, he started having issues with his legs and back. Pt reports pulling something in R anterolateral hip when walking on treadmill. Pt reports having therapy at [facility] and having alleviation of R hip/leg pain with working on his spine. Hx of bilateral THA; pt has remaining pain in his R hip s/p THA from 2019. 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 He denies paresthesias in lower limbs; he does report numbness in R hip. He reports constant pain in his R hip. He reports he is sedentary and watches TV during much of the day. He had hx of ESI that did alleviate his symptoms; however, he reports his pain returned the following morning.    Pain:  Pain Intensity: Present: 5/10, Best: 0/10, Worst: 7/10 Pain location: Axial low back and R paraspinal, R lateral thigh/lateral hip Pain Quality: constant in R hip, sharp pain in low back  Numbness/Tingling: Yes, R hip Focal Weakness: Yes, "muscles in legs have gone to sleep, because I'm not as active as I used to be"  Aggravating factors: prolonged sitting, sedentary activity, cold weather  Relieving factors: on the move 24-hour pain behavior: worse in AM  How long can you sit: 1 hour History of prior back injury, pain, surgery, or therapy: Yes, hx bilat THA Falls: Has patient fallen in last 6 months? No Follow-up appointment with MD: Yes, f/u with Whitney Meeler, date unknown   Imaging: Yes    MRI Lumbar Spine 10/12/21 Disc spaces: Degenerative disease with disc height loss throughout the thoracolumbar spine.    T12-L1: Mild broad-based disc bulge. No foraminal or central canal stenosis.   L1-L2: Mild broad-based disc bulge. No foraminal or central canal stenosis.   L2-L3: Mild broad-based disc bulge flattening the ventral thecal sac. Mild bilateral facet arthropathy. Mild spinal stenosis. No significant foraminal or central canal stenosis.   L3-L4: Broad-based disc bulge flattening ventral thecal sac. Mild bilateral facet arthropathy. Moderate spinal stenosis. Bilateral subarticular recess stenosis. Moderate bilateral foraminal stenosis.   L4-L5: Broad-based disc bulge. Mild bilateral facet arthropathy. Bilateral subarticular recess stenosis. Moderate bilateral foraminal stenosis, right greater than left. Mild spinal stenosis.   L5-S1: Broad-based disc osteophyte complex. Mild bilateral facet arthropathy. Moderate left foraminal stenosis. Mild right foraminal stenosis. No spinal stenosis.   IMPRESSION: 1. Diffuse lumbar spine spondylosis as described above. 2. No acute osseous injury of the lumbar spine.     By: Kathreen Devoid M.D.   On: 10/12/2021 08:46     Prior level of function: Independent Occupational demands: Pt on disability  Hobbies: yard work, fishing, Location manager  Red flags (bowel/bladder changes, saddle paresthesia, personal history of cancer, h/o spinal tumors, h/o compression fx, h/o abdominal aneurysm, abdominal pain, chills/fever, night sweats, nausea, vomiting, unrelenting pain, first onset of insidious LBP <20 y/o): Negative     Precautions: None   Weight Bearing Restrictions: No   Living Environment Lives with: lives with their family, lives with 72 year old mother Lives in: Mobile home   Patient Goals: Improved pain     PRECAUTIONS: None     OBJECTIVE: (objective measures completed at initial evaluation unless otherwise dated)     Patient Surveys  FOTO 52 (risk-adjusted 34), predicted outcome score of 60     Cognition Patient is oriented to  person, place, and time.  Recent memory is intact.  Remote memory is intact.  Attention span and concentration are intact.  Expressive speech is intact.  Patient's fund of knowledge is within normal limits for educational level.                            Gross Musculoskeletal Assessment Tremor: None Bulk: Normal Tone: Normal No visible step-off along spinal column, no signs of scoliosis     GAIT: Forward flexed posture, mild guarded posture with dec arm swing and trunk rotation       AROM          AROM (Normal range in degrees) AROM  02/12/2022  Lumbar    Flexion (65) WNL  Extension (30) WNL  Right lateral flexion (25) WNL  Left lateral flexion (25) WNL*  Right rotation (30) WNL  Left rotation (30) WNL         Hip Right Left  Flexion (125) 120 (pain at 90 deg hip flexion along R greater trochanter) WNL  Extension (15)      Abduction (40) WNL WNL  Adduction       Internal Rotation (45)      External Rotation (45) WNL WNL         Ankle      Dorsiflexion (20) WNL WNL  Plantarflexion (50)      Inversion (35)      Eversion (15      (* = pain; Blank rows = not tested)     LE MMT:   MMT (out of 5) Right 02/12/2022 Left 02/12/2022  Hip flexion 4 4*  Hip extension 4- 4-  Hip abduction 4- 4-  Hip adduction      Hip internal rotation      Hip external rotation      Knee flexion 5 5  Knee extension 5 5  Ankle dorsiflexion 4+ 4+  Ankle plantarflexion      Ankle inversion      Ankle eversion      Great toe extension 4+ 4+  (* = pain; Blank rows = not tested)     Sensation Grossly intact to light touch bilateral LEs as determined by testing dermatomes L2-S2. Proprioception, and hot/cold testing deferred on this date.     Reflexes R/L Knee Jerk (L3/4): 2+/2+  Ankle Jerk (S1/2): 3+/3+      Muscle Length Hamstrings: R: Positive, lacking 30 deg L: Positive, lacking 45 deg [place] (quadriceps): R: Positive L: Positive Thomas (hip flexors): R: Not examined L:  Not examined       Palpation   Location LEFT  RIGHT           Lumbar paraspinals 1 1  Quadratus Lumborum  Gluteus Maximus      Gluteus Medius   2  Deep hip external rotators      PSIS      Fortin's Area (SIJ)      Greater Trochanter      (Blank rows = not tested) Graded on 0-4 scale (0 = no pain, 1 = pain, 2 = pain with wincing/grimacing/flinching, 3 = pain with withdrawal, 4 = unwilling to allow palpation), (Blank rows = not tested)     Passive Accessory Intervertebral Motion 02/18/22: Hypomobile T3-T10, L3-5 with no pain at restriction     SPECIAL TESTS Lumbar Radiculopathy and Discogenic: Centralization and Peripheralization (SN 92, -LR 0.12): Not examined Slump (SN 83, -LR 0.32): R: Negative L: Positive SLR (SN 92, -LR 0.29): R: Negative L:  Negative Crossed SLR (SP 90): R: Negative L: Negative   Facet Joint: Extension-Rotation (SN 100, -LR 0.0): R: Negative L: Positive   Lumbar Foraminal Stenosis: Lumbar quadrant (SN 70): R: Positive L: Positive           TODAY'S TREATMENT    SUBJECTIVE: Patient reports having issues with getting excessively hot during the night. He denies palpitations, cold sweats, N&V, chills, or other notable symptoms. Patient reports doing relatively well with his symptoms. He reports mild pain affecting R anterior hip/thigh. He reports mild pain this AM affecting axial low back.     PAIN:  Are you having pain? Yes: NPRS scale: 2/10 Pain location: pain in lower lumbar region across waistline with noted "knot,"  pain in R anterior hip          Manual Therapy - for symptom modulation, soft tissue sensitivity and mobility, joint mobility, ROM    Performed in prone with 2 pillows under pelvis to limit lumbar extension:  STM/DTM bilateral erector spinae L3-L5; x 15 minutes    CPA and L UPA gr II for pain control, gr III-IV for mobility L3-5; 2x30 sec at each region for each grade   *not today* Thoracic mobilization  IASTM with  Theraband roller R vastus lateralis and rectus femoris       Therapeutic Exercise - for improved soft tissue flexibility and extensibility as needed for ROM, improved strength as needed to improve performance of CKC activities/functional movements  Repeated flexion in sitting; 1x15  -no effect during, remaining R flank pain and no R thigh pain after   Posterior pelvic tilt, hooklying, heavy verbal and tactile cueing, demonstration for technique; 2x10   Swiss ball rollout; Blue ball; x5 ea dir, 5 sec hold; anterior and anterolateral R/L  Bridge with Black band around knees for hip abduction moment; 2x10    Pt edu: discussed benefit of walking program and simple parameters for progression of walking volume. Recommended follow-up with his physician regarding hot flashes and determining potential hormonal or metabolic factors contributing.    *next visit* Alternating hip flexion isotonic with Green Tband; 2x10 alt R/L, in standing today    *not today* Lower trunk rotations; with QL bias (figure-4 position for RLE x 10, then LLE x10) Open book; 1 x 10 on each side Seated hamstring stretch; 2x30 sec              PATIENT EDUCATION:  Education details: see above for patient education details   Person educated: Patient Education method: Explanation, Demonstration, and Handouts Education comprehension: verbalized understanding and returned demonstration     HOME EXERCISE PROGRAM: Access Code [phone]     ASSESSMENT:   CLINICAL IMPRESSION:   Patient fortunately  has low NPRS this AM and has done well with PT. He does frequently report return of pain when going about daily activities in and around his home; encouraged pt to use frequent repeated movement program to keep symptoms controlled during ADLs. Patient reports frequent disturbed sleep due to feeling excessively hot similar to hot flashes. Pt had thyroid disorder ruled out. No signs of infection or other concerning red flag  complaints. Recommend f/u with PCP for further diagnostic workup for this condition. Patient has remaining deficits in thoracic and lumbar spine accessory mobility, postural changes, hip flexor and gluteal weakness, and local nociceptive low back and bilateral paraspinal pain without paresthesias.  Patient will benefit from continued skilled PT to address above impairments and improve overall function.   REHAB POTENTIAL: Good   CLINICAL DECISION MAKING: Evolving/moderate complexity   EVALUATION COMPLEXITY: Moderate     GOALS:   SHORT TERM GOALS: Target date: 02/26/2022   Pt will be independent with HEP to improve strength and decrease back pain to improve pain-free function at home and work. Baseline: 02/12/22: Baseline HEP initiated Goal status: INITIAL   Pt will complete walking program at least 3-5 days per week for 10-15 minutes or longer as needed for improved long-term health and to decrease central sensitivity related to chronic pain Baseline: 02/12/22: Initial education given for increase in physical activity and walking program most days of the week.  Goal status: INITIAL     LONG TERM GOALS: Target date: 03/27/2022   Pt will increase FOTO to at least 60 to demonstrate significant improvement in function at home and work related to back pain  Baseline: 02/12/22: 59 (risk-adjusted 46) Goal status: INITIAL   2.  Pt will decrease worst back pain by at least 2 points on the NPRS in order to demonstrate clinically significant reduction in back pain. Baseline: 02/12/22: 7/10 at worst Goal status: INITIAL   3.  Patient will have MMT 4+/5 or greater for all hip muscles tested indicative of improved strength as needed for improved tolerance of performing yard work and tolerance to loading affected tissues for improved daily physical activity tolerance Baseline: 02/12/22: MMTs for bilateral hips 4- to 4/5 (see table above).  Goal status: INITIAL   4.  Patient will be able to sit and  perform standing/ambulatory activity > 1 hour as needed for improved ability to participate in social activities and leisure activities as well as complete community outings/errands Baseline: 02/12/22: Pt has to stop sitting at 1 hour due to low back pain.  Goal status: INITIAL     PLAN: PT FREQUENCY: 2x/week   PT DURATION: 8 weeks   PLANNED INTERVENTIONS: Therapeutic exercises, Therapeutic activity, Neuromuscular re-education, Patient/Family education, Dry Needling, Electrical stimulation, Spinal manipulation, Spinal mobilization, Cryotherapy, Moist heat, Traction, and Manual therapy   PLAN FOR NEXT SESSION: Continued with manual therapy/STM to improve sensitivity of lumbar paraspinals, gluteal musculature, and R anterior hip. Continue with gentle graded movement with gradual introduction of hip strengthening as tolerated.       Valentina Gu, PT, DPT #[id number]  Eilleen Kempf, PT 03/06/2022, 10:56 AM

## 2022-03-10 ENCOUNTER — Encounter: Payer: Self-pay | Admitting: Physical Therapy

## 2022-03-10 ENCOUNTER — Ambulatory Visit: Payer: Medicaid Other | Admitting: Physical Therapy

## 2022-03-10 DIAGNOSIS — M5459 Other low back pain: Secondary | ICD-10-CM

## 2022-03-10 DIAGNOSIS — M25551 Pain in right hip: Secondary | ICD-10-CM

## 2022-03-10 DIAGNOSIS — M6281 Muscle weakness (generalized): Secondary | ICD-10-CM

## 2022-03-10 DIAGNOSIS — M25552 Pain in left hip: Secondary | ICD-10-CM

## 2022-03-10 NOTE — Therapy (Signed)
OUTPATIENT PHYSICAL THERAPY TREATMENT NOTE   Patient Name: Johnny Williams MRN: [id number] DOB:[birth year], 61 y.o., male Today's Date: 03/10/2022   END OF SESSION:   PT End of Session - 03/10/22 0845     Visit Number 7    Number of Visits 13    Date for PT Re-Evaluation 03/27/22    Authorization Type [employer], VL based on auth    Progress Note Due on Visit 10    PT Start Time 0845    PT Stop Time 0930    PT Time Calculation (min) 45 min    Activity Tolerance Patient limited by pain;Patient tolerated treatment well    Behavior During Therapy [facility] for tasks assessed/performed                Past Medical History:  Diagnosis Date   Anxiety    Arthritis    Chronic insomnia    COPD (chronic obstructive pulmonary disease) (HCC)    Dyspnea    Esophageal stricture    Esophageal varices (HCC)    GERD (gastroesophageal reflux disease)    Hyperlipidemia    Hypertension    Joint ache    Pre-diabetes    Reflux esophagitis    Sleep apnea    Past Surgical History:  Procedure Laterality Date   COLONOSCOPY WITH PROPOFOL N/A 09/06/2019   Procedure: COLONOSCOPY WITH PROPOFOL;  Surgeon: Lucilla Lame, MD;  Location: [facility] ENDOSCOPY;  Service: Endoscopy;  Laterality: N/A;   ESOPHAGOGASTRODUODENOSCOPY (EGD) WITH PROPOFOL N/A 09/06/2019   Procedure: ESOPHAGOGASTRODUODENOSCOPY (EGD) WITH PROPOFOL;  Surgeon: Lucilla Lame, MD;  Location: [facility] ENDOSCOPY;  Service: Endoscopy;  Laterality: N/A;   TOTAL HIP ARTHROPLASTY Right 05/19/2018   Procedure: TOTAL HIP ARTHROPLASTY;  Surgeon: Dereck Leep, MD;  Location: [facility] ORS;  Service: Orthopedics;  Laterality: Right;   TOTAL HIP ARTHROPLASTY Left 11/16/2019   Procedure: TOTAL HIP ARTHROPLASTY;  Surgeon: Dereck Leep, MD;  Location: [facility] ORS;  Service: Orthopedics;  Laterality: Left;  REQUESTING 3HRS   Patient Active Problem List   Diagnosis Date Noted   Gastroesophageal reflux disease with esophagitis    Screen for colon cancer     Polyp of sigmoid colon    Erythrocytosis 07/29/2019   2-vessel coronary artery disease 07/07/2019   Aortic atherosclerosis ([place]) 07/07/2019   Cholelithiasis 07/07/2019   Obesity (BMI 35.0-39.9 without comorbidity) 07/07/2019   Vitamin B12 deficiency 07/07/2019   H/O total hip arthroplasty 05/19/2018   Left lumbar radiculitis 11/09/2017   DDD (degenerative disc disease), lumbosacral 11/09/2017   Vitamin D insufficiency 10/12/2017   Osteoarthritis of hip (Bilateral) (L>R) 10/12/2017   Lumbar facet syndrome (Bilateral) (L>R) 10/12/2017   Anxiety 09/29/2017   Chronic insomnia 09/29/2017   Cluster headaches 09/29/2017   COPD (chronic obstructive pulmonary disease) ([place]) 09/29/2017   Esophageal varices ([place]) 09/29/2017   Essential hypertension 09/29/2017   Sleep apnea 09/29/2017   Smoker 09/29/2017   Stricture of esophagus 09/29/2017   Type 2 diabetes mellitus without complication ([place]) 90/24/0973   Chronic low back pain (Secondary Area of Pain) (Bilateral) with sciatica (Left) 09/29/2017   Chronic lower extremity pain (Tertiary Area of Pain) (Bilateral) (L>R) 09/29/2017   Chronic hip pain (Primary Area of Pain) (Bilateral) (L>R) 09/29/2017   Chronic knee pain (Fourth Area of Pain) (Bilateral) (L>R) 09/29/2017   Chronic pain syndrome 09/29/2017   Pharmacologic therapy 09/29/2017   Disorder of skeletal system 09/29/2017   Problems influencing health status 09/29/2017   High risk medication use 05/11/2014   Gastritis  and duodenitis 11/07/2013   Hyperlipidemia, unspecified 11/07/2013   Variants of migraine, not elsewhere classified, with intractable migraine, so stated, without mention of status migrainosus 11/07/2013        PCP: [facility], [place] REFERRING PROVIDER: Sherren Kerns Meeler, FNP   REFERRING DIAG:  [phone] (ICD-10-CM) - Spinal stenosis, lumbar region with neurogenic claudication  M54.16 (ICD-10-CM) - Radiculopathy, lumbar region  M51.36 (ICD-10-CM) - Other  intervertebral disc degeneration, lumbar region      THERAPY DIAG:  Other low back pain   Pain in right hip   Pain in left hip   Muscle weakness (generalized)   Rationale for Evaluation and Treatment Rehabilitation   PERTINENT HISTORY: Patient is a 61 year old male with primary complaint of low back pain. Pt reports losing his job back in 2010. He reports in 2015, he started having issues with his legs and back. Pt reports pulling something in R anterolateral hip when walking on treadmill. Pt reports having therapy at [facility] and having alleviation of R hip/leg pain with working on his spine. Hx of bilateral THA; pt has remaining pain in his R hip s/p THA from 2019. 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 He denies paresthesias in lower limbs; he does report numbness in R hip. He reports constant pain in his R hip. He reports he is sedentary and watches TV during much of the day. He had hx of ESI that did alleviate his symptoms; however, he reports his pain returned the following morning.    Pain:  Pain Intensity: Present: 5/10, Best: 0/10, Worst: 7/10 Pain location: Axial low back and R paraspinal, R lateral thigh/lateral hip Pain Quality: constant in R hip, sharp pain in low back  Numbness/Tingling: Yes, R hip Focal Weakness: Yes, "muscles in legs have gone to sleep, because I'm not as active as I used to be"  Aggravating factors: prolonged sitting, sedentary activity, cold weather  Relieving factors: on the move 24-hour pain behavior: worse in AM  How long can you sit: 1 hour History of prior back injury, pain, surgery, or therapy: Yes, hx bilat THA Falls: Has patient fallen in last 6 months? No Follow-up appointment with MD: Yes, f/u with Whitney Meeler, date unknown   Imaging: Yes    MRI Lumbar Spine 10/12/21 Disc spaces: Degenerative disease with disc height loss throughout the thoracolumbar spine.    T12-L1: Mild broad-based disc bulge. No foraminal or central canal stenosis.   L1-L2: Mild broad-based disc bulge. No foraminal or central canal stenosis.   L2-L3: Mild broad-based disc bulge flattening the ventral thecal sac. Mild bilateral facet arthropathy. Mild spinal stenosis. No significant foraminal or central canal stenosis.   L3-L4: Broad-based disc bulge flattening ventral thecal sac. Mild bilateral facet arthropathy. Moderate spinal stenosis. Bilateral subarticular recess stenosis. Moderate bilateral foraminal stenosis.   L4-L5: Broad-based disc bulge. Mild bilateral facet arthropathy. Bilateral subarticular recess stenosis. Moderate bilateral foraminal stenosis, right greater than left. Mild spinal stenosis.   L5-S1: Broad-based disc osteophyte complex. Mild bilateral facet arthropathy. Moderate left foraminal stenosis. Mild right foraminal stenosis. No spinal stenosis.   IMPRESSION: 1. Diffuse lumbar spine spondylosis as described above. 2. No acute osseous injury of the lumbar spine.     By: Kathreen Devoid M.D.   On: 10/12/2021 08:46     Prior level of function: Independent Occupational demands: Pt on disability  Hobbies: yard work, fishing, Location manager  Red flags (bowel/bladder changes, saddle paresthesia, personal history of cancer, h/o spinal tumors, h/o compression fx, h/o abdominal aneurysm, abdominal pain, chills/fever, night sweats, nausea, vomiting, unrelenting pain, first onset of insidious LBP <20 y/o): Negative     Precautions: None   Weight Bearing Restrictions: No   Living Environment Lives with: lives with their family, lives with 84 year old mother Lives in: Mobile home   Patient Goals: Improved pain     PRECAUTIONS: None     OBJECTIVE: (objective measures completed at initial evaluation unless otherwise dated)     Patient Surveys  FOTO 3 (risk-adjusted 28), predicted outcome score of 60     Cognition Patient is oriented to  person, place, and time.  Recent memory is intact.  Remote memory is intact.  Attention span and concentration are intact.  Expressive speech is intact.  Patient's fund of knowledge is within normal limits for educational level.                            Gross Musculoskeletal Assessment Tremor: None Bulk: Normal Tone: Normal No visible step-off along spinal column, no signs of scoliosis     GAIT: Forward flexed posture, mild guarded posture with dec arm swing and trunk rotation       AROM          AROM (Normal range in degrees) AROM  02/12/2022  Lumbar    Flexion (65) WNL  Extension (30) WNL  Right lateral flexion (25) WNL  Left lateral flexion (25) WNL*  Right rotation (30) WNL  Left rotation (30) WNL         Hip Right Left  Flexion (125) 120 (pain at 90 deg hip flexion along R greater trochanter) WNL  Extension (15)      Abduction (40) WNL WNL  Adduction       Internal Rotation (45)      External Rotation (45) WNL WNL         Ankle      Dorsiflexion (20) WNL WNL  Plantarflexion (50)      Inversion (35)      Eversion (15      (* = pain; Blank rows = not tested)     LE MMT:   MMT (out of 5) Right 02/12/2022 Left 02/12/2022  Hip flexion 4 4*  Hip extension 4- 4-  Hip abduction 4- 4-  Hip adduction      Hip internal rotation      Hip external rotation      Knee flexion 5 5  Knee extension 5 5  Ankle dorsiflexion 4+ 4+  Ankle plantarflexion      Ankle inversion      Ankle eversion      Great toe extension 4+ 4+  (* = pain; Blank rows = not tested)     Sensation Grossly intact to light touch bilateral LEs as determined by testing dermatomes L2-S2. Proprioception, and hot/cold testing deferred on this date.     Reflexes R/L Knee Jerk (L3/4): 2+/2+  Ankle Jerk (S1/2): 3+/3+      Muscle Length Hamstrings: R: Positive, lacking 30 deg L: Positive, lacking 45 deg [place] (quadriceps): R: Positive L: Positive Thomas (hip flexors): R: Not examined L:  Not examined       Palpation   Location LEFT  RIGHT           Lumbar paraspinals 1 1  Quadratus Lumborum  Gluteus Maximus      Gluteus Medius   2  Deep hip external rotators      PSIS      Fortin's Area (SIJ)      Greater Trochanter      (Blank rows = not tested) Graded on 0-4 scale (0 = no pain, 1 = pain, 2 = pain with wincing/grimacing/flinching, 3 = pain with withdrawal, 4 = unwilling to allow palpation), (Blank rows = not tested)     Passive Accessory Intervertebral Motion 02/18/22: Hypomobile T3-T10, L3-5 with no pain at restriction     SPECIAL TESTS Lumbar Radiculopathy and Discogenic: Centralization and Peripheralization (SN 92, -LR 0.12): Not examined Slump (SN 83, -LR 0.32): R: Negative L: Positive SLR (SN 92, -LR 0.29): R: Negative L:  Negative Crossed SLR (SP 90): R: Negative L: Negative   Facet Joint: Extension-Rotation (SN 100, -LR 0.0): R: Negative L: Positive   Lumbar Foraminal Stenosis: Lumbar quadrant (SN 70): R: Positive L: Positive           TODAY'S TREATMENT    SUBJECTIVE: Patient reports working on his truck this past weekend. He reports quickly turning his torso while lower limbs were fixed on ground and having increase in pain in R hip - lateral trochanteric region and along R groin. Pt denies notable pain along lower lumbar region at arrival today.     PAIN:  Are you having pain? Yes: NPRS scale: 4/10 Pain location: pain R anterior hip         Manual Therapy - for symptom modulation, soft tissue sensitivity and mobility, joint mobility, ROM    Supine:  STM/DTM and IASTM with Hypervolt (Level 2 intensity) R TFL, distal iliopsoas, proximal quads; x 10 minutes PROM into flexion, ER, abduction; x 1 minute Gentle piriformis stretching with hip in ER; 2x30 sec   *not today* STM/DTM bilateral erector spinae L3-L5; x 15 minutes CPA and L UPA gr II for pain control, gr III-IV for mobility L3-5; 2x30 sec at each region for each  grade Thoracic mobilization  IASTM with Theraband roller R vastus lateralis and rectus femoris    Cold pack (unbilled) - for anti-inflammatory and analgesic effect as needed for reduced pain and improved ability to participate in active PT intervention, along R lateral hip in left sidelying, x 5 minutes      Therapeutic Exercise - for improved soft tissue flexibility and extensibility as needed for ROM, improved strength as needed to improve performance of CKC activities/functional movements  NuStep; Level 5, seat 11; x 5 minutes for nervous system downregulation and improved soft tissue extensibility - subjective gathered intermittently during this time, 2 minutes unbilled    Bridge with Black band around knees for hip abduction moment; 2x12  Swiss ball rollout; Blue ball; x5 ea dir, 5 sec hold; anterior and anterolateral R/L  Alternating hip flexion isotonic with Green Tband; 2x10 alt R/L, in standing today    Pt edu: Reviewed HEP and encouraged continued work on repeated movement program for lumbar spine and initiating regular walking regimen.     *not today* Posterior pelvic tilt, hooklying, heavy verbal and tactile cueing, demonstration for technique; 2x10 Repeated flexion in sitting; 1x15  -no effect during, remaining R flank pain and no R thigh pain after Lower trunk rotations; with QL bias (figure-4 position for RLE x 10, then LLE x10) Open book; 1 x 10 on each side Seated hamstring stretch; 2x30 sec  PATIENT EDUCATION:  Education details: see above for patient education details   Person educated: Patient Education method: Explanation, Demonstration, and Handouts Education comprehension: verbalized understanding and returned demonstration     HOME EXERCISE PROGRAM: Access Code [phone]     ASSESSMENT:   CLINICAL IMPRESSION:   Patient reports no significant low back or lumbar paraspinal pain this AM. 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 Patient has taut and tender R TFL and R distal iliopsoas. Pt responds well to manual work today - utilized cryotherapy to decrease acute pain. Pt is able to modestly progress with hip strengthening today with moderate fatigue, but there is no increase in pain. Patient has remaining deficits in thoracic and lumbar spine accessory mobility, postural changes, hip flexor and gluteal weakness, and local nociceptive low back and bilateral paraspinal pain without paresthesias.  Patient will benefit from continued skilled PT to address above impairments and improve overall function.   REHAB POTENTIAL: Good   CLINICAL DECISION MAKING: Evolving/moderate complexity   EVALUATION COMPLEXITY: Moderate     GOALS:   SHORT TERM GOALS: Target date: 02/26/2022   Pt will be independent with HEP to improve strength and decrease back pain to improve pain-free function at home and work. Baseline: 02/12/22: Baseline HEP initiated Goal status: INITIAL   Pt will complete walking program at least 3-5 days per week for 10-15 minutes or longer as needed for improved long-term health and to decrease central sensitivity related to chronic pain Baseline: 02/12/22: Initial education given for increase in physical activity and walking program most days of the week.  Goal status: INITIAL     LONG TERM GOALS: Target date: 03/27/2022   Pt will increase FOTO to at least 60 to demonstrate significant improvement in function at home and work related to back pain  Baseline: 02/12/22: 59 (risk-adjusted 46) Goal status: INITIAL   2.  Pt will decrease worst back pain by at least 2 points on the NPRS in order to demonstrate clinically significant reduction in back pain. Baseline: 02/12/22: 7/10 at worst Goal status: INITIAL   3.  Patient will have MMT 4+/5 or greater for all hip  muscles tested indicative of improved strength as needed for improved tolerance of performing yard work and tolerance to loading affected tissues for improved daily physical activity tolerance Baseline: 02/12/22: MMTs for bilateral hips 4- to 4/5 (see table above).  Goal status: INITIAL   4.  Patient will be able to sit and perform standing/ambulatory activity > 1 hour as needed for improved ability to participate in social activities and leisure activities as well as complete community outings/errands Baseline: 02/12/22: Pt has to stop sitting at 1 hour due to low back pain.  Goal status: INITIAL     PLAN: PT FREQUENCY: 2x/week   PT DURATION: 8 weeks   PLANNED INTERVENTIONS: Therapeutic exercises, Therapeutic activity, Neuromuscular re-education, Patient/Family education, Dry Needling, Electrical stimulation, Spinal manipulation, Spinal mobilization, Cryotherapy, Moist heat, Traction, and Manual therapy   PLAN FOR NEXT SESSION: Continued with manual therapy/STM to improve sensitivity of lumbar paraspinals, gluteal musculature, and R anterior hip. Continue with gentle graded movement with gradual introduction of hip strengthening as tolerated.       Valentina Gu, PT, DPT #[id number]  Eilleen Kempf, PT 03/10/2022, 10:44 AM

## 2022-03-12 ENCOUNTER — Ambulatory Visit: Payer: Medicaid Other | Admitting: Physical Therapy

## 2022-03-12 DIAGNOSIS — M25552 Pain in left hip: Secondary | ICD-10-CM

## 2022-03-12 DIAGNOSIS — M5459 Other low back pain: Secondary | ICD-10-CM | POA: Diagnosis not present

## 2022-03-12 DIAGNOSIS — M6281 Muscle weakness (generalized): Secondary | ICD-10-CM

## 2022-03-12 DIAGNOSIS — M25551 Pain in right hip: Secondary | ICD-10-CM

## 2022-03-12 NOTE — Therapy (Signed)
OUTPATIENT PHYSICAL THERAPY TREATMENT NOTE   Patient Name: Johnny Williams MRN: [id number] DOB:1960-12-15, 61 y.o., male Today's Date: 03/12/2022   END OF SESSION:   PT End of Session - 03/12/22 1356     Visit Number 8    Number of Visits 13    Date for PT Re-Evaluation 03/27/22    Authorization Type [employer], VL based on auth    Progress Note Due on Visit 10    PT Start Time 0845    PT Stop Time 0928    PT Time Calculation (min) 43 min    Activity Tolerance Patient limited by pain;Patient tolerated treatment well    Behavior During Therapy [facility] for tasks assessed/performed                 Past Medical History:  Diagnosis Date   Anxiety    Arthritis    Chronic insomnia    COPD (chronic obstructive pulmonary disease) (HCC)    Dyspnea    Esophageal stricture    Esophageal varices (HCC)    GERD (gastroesophageal reflux disease)    Hyperlipidemia    Hypertension    Joint ache    Pre-diabetes    Reflux esophagitis    Sleep apnea    Past Surgical History:  Procedure Laterality Date   COLONOSCOPY WITH PROPOFOL N/A 09/06/2019   Procedure: COLONOSCOPY WITH PROPOFOL;  Surgeon: Lucilla Lame, MD;  Location: [facility] ENDOSCOPY;  Service: Endoscopy;  Laterality: N/A;   ESOPHAGOGASTRODUODENOSCOPY (EGD) WITH PROPOFOL N/A 09/06/2019   Procedure: ESOPHAGOGASTRODUODENOSCOPY (EGD) WITH PROPOFOL;  Surgeon: Lucilla Lame, MD;  Location: [facility] ENDOSCOPY;  Service: Endoscopy;  Laterality: N/A;   TOTAL HIP ARTHROPLASTY Right 05/19/2018   Procedure: TOTAL HIP ARTHROPLASTY;  Surgeon: Dereck Leep, MD;  Location: [facility] ORS;  Service: Orthopedics;  Laterality: Right;   TOTAL HIP ARTHROPLASTY Left 11/16/2019   Procedure: TOTAL HIP ARTHROPLASTY;  Surgeon: Dereck Leep, MD;  Location: [facility] ORS;  Service: Orthopedics;  Laterality: Left;  REQUESTING 3HRS   Patient Active Problem List   Diagnosis Date Noted   Gastroesophageal reflux disease with esophagitis    Screen for colon cancer     Polyp of sigmoid colon    Erythrocytosis 07/29/2019   2-vessel coronary artery disease 07/07/2019   Aortic atherosclerosis ([place]) 07/07/2019   Cholelithiasis 07/07/2019   Obesity (BMI 35.0-39.9 without comorbidity) 07/07/2019   Vitamin B12 deficiency 07/07/2019   H/O total hip arthroplasty 05/19/2018   Left lumbar radiculitis 11/09/2017   DDD (degenerative disc disease), lumbosacral 11/09/2017   Vitamin D insufficiency 10/12/2017   Osteoarthritis of hip (Bilateral) (L>R) 10/12/2017   Lumbar facet syndrome (Bilateral) (L>R) 10/12/2017   Anxiety 09/29/2017   Chronic insomnia 09/29/2017   Cluster headaches 09/29/2017   COPD (chronic obstructive pulmonary disease) ([place]) 09/29/2017   Esophageal varices ([place]) 09/29/2017   Essential hypertension 09/29/2017   Sleep apnea 09/29/2017   Smoker 09/29/2017   Stricture of esophagus 09/29/2017   Type 2 diabetes mellitus without complication ([place]) 42/87/6811   Chronic low back pain (Secondary Area of Pain) (Bilateral) with sciatica (Left) 09/29/2017   Chronic lower extremity pain (Tertiary Area of Pain) (Bilateral) (L>R) 09/29/2017   Chronic hip pain (Primary Area of Pain) (Bilateral) (L>R) 09/29/2017   Chronic knee pain (Fourth Area of Pain) (Bilateral) (L>R) 09/29/2017   Chronic pain syndrome 09/29/2017   Pharmacologic therapy 09/29/2017   Disorder of skeletal system 09/29/2017   Problems influencing health status 09/29/2017   High risk medication use 05/11/2014  Gastritis and duodenitis 11/07/2013   Hyperlipidemia, unspecified 11/07/2013   Variants of migraine, not elsewhere classified, with intractable migraine, so stated, without mention of status migrainosus 11/07/2013        PCP: [facility], [place] REFERRING PROVIDER: Sherren Kerns Meeler, FNP   REFERRING DIAG:  [phone] (ICD-10-CM) - Spinal stenosis, lumbar region with neurogenic claudication  M54.16 (ICD-10-CM) - Radiculopathy, lumbar region  M51.36 (ICD-10-CM) - Other  intervertebral disc degeneration, lumbar region      THERAPY DIAG:  Other low back pain   Pain in right hip   Pain in left hip   Muscle weakness (generalized)   Rationale for Evaluation and Treatment Rehabilitation   PERTINENT HISTORY: Patient is a 61 year old male with primary complaint of low back pain. Pt reports losing his job back in 2010. He reports in 2015, he started having issues with his legs and back. Pt reports pulling something in R anterolateral hip when walking on treadmill. Pt reports having therapy at [facility] and having alleviation of R hip/leg pain with working on his spine. Hx of bilateral THA; pt has remaining pain in his R hip s/p THA from 2019. 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 He denies paresthesias in lower limbs; he does report numbness in R hip. He reports constant pain in his R hip. He reports he is sedentary and watches TV during much of the day. He had hx of ESI that did alleviate his symptoms; however, he reports his pain returned the following morning.    Pain:  Pain Intensity: Present: 5/10, Best: 0/10, Worst: 7/10 Pain location: Axial low back and R paraspinal, R lateral thigh/lateral hip Pain Quality: constant in R hip, sharp pain in low back  Numbness/Tingling: Yes, R hip Focal Weakness: Yes, "muscles in legs have gone to sleep, because I'm not as active as I used to be"  Aggravating factors: prolonged sitting, sedentary activity, cold weather  Relieving factors: on the move 24-hour pain behavior: worse in AM  How long can you sit: 1 hour History of prior back injury, pain, surgery, or therapy: Yes, hx bilat THA Falls: Has patient fallen in last 6 months? No Follow-up appointment with MD: Yes, f/u with Whitney Meeler, date unknown   Imaging: Yes    MRI Lumbar Spine 10/12/21 Disc spaces: Degenerative disease with disc height loss throughout the thoracolumbar spine.    T12-L1: Mild broad-based disc bulge. No foraminal or central canal stenosis.   L1-L2: Mild broad-based disc bulge. No foraminal or central canal stenosis.   L2-L3: Mild broad-based disc bulge flattening the ventral thecal sac. Mild bilateral facet arthropathy. Mild spinal stenosis. No significant foraminal or central canal stenosis.   L3-L4: Broad-based disc bulge flattening ventral thecal sac. Mild bilateral facet arthropathy. Moderate spinal stenosis. Bilateral subarticular recess stenosis. Moderate bilateral foraminal stenosis.   L4-L5: Broad-based disc bulge. Mild bilateral facet arthropathy. Bilateral subarticular recess stenosis. Moderate bilateral foraminal stenosis, right greater than left. Mild spinal stenosis.   L5-S1: Broad-based disc osteophyte complex. Mild bilateral facet arthropathy. Moderate left foraminal stenosis. Mild right foraminal stenosis. No spinal stenosis.   IMPRESSION: 1. Diffuse lumbar spine spondylosis as described above. 2. No acute osseous injury of the lumbar spine.     By: Kathreen Devoid M.D.   On: 10/12/2021 08:46     Prior level of function: Independent Occupational demands: Pt on disability  Hobbies: yard work, fishing, Location manager  Red flags (bowel/bladder changes, saddle paresthesia, personal history of cancer, h/o spinal tumors, h/o compression fx, h/o abdominal aneurysm, abdominal pain, chills/fever, night sweats, nausea, vomiting, unrelenting pain, first onset of insidious LBP <20 y/o): Negative     Precautions: None   Weight Bearing Restrictions: No   Living Environment Lives with: lives with their family, lives with 71 year old mother Lives in: Mobile home   Patient Goals: Improved pain     PRECAUTIONS: None     OBJECTIVE: (objective measures completed at initial evaluation unless otherwise dated)     Patient Surveys  FOTO 33 (risk-adjusted 45), predicted outcome score of 60     Cognition Patient is oriented to  person, place, and time.  Recent memory is intact.  Remote memory is intact.  Attention span and concentration are intact.  Expressive speech is intact.  Patient's fund of knowledge is within normal limits for educational level.                            Gross Musculoskeletal Assessment Tremor: None Bulk: Normal Tone: Normal No visible step-off along spinal column, no signs of scoliosis     GAIT: Forward flexed posture, mild guarded posture with dec arm swing and trunk rotation       AROM          AROM (Normal range in degrees) AROM  02/12/2022  Lumbar    Flexion (65) WNL  Extension (30) WNL  Right lateral flexion (25) WNL  Left lateral flexion (25) WNL*  Right rotation (30) WNL  Left rotation (30) WNL         Hip Right Left  Flexion (125) 120 (pain at 90 deg hip flexion along R greater trochanter) WNL  Extension (15)      Abduction (40) WNL WNL  Adduction       Internal Rotation (45)      External Rotation (45) WNL WNL         Ankle      Dorsiflexion (20) WNL WNL  Plantarflexion (50)      Inversion (35)      Eversion (15      (* = pain; Blank rows = not tested)     LE MMT:   MMT (out of 5) Right 02/12/2022 Left 02/12/2022  Hip flexion 4 4*  Hip extension 4- 4-  Hip abduction 4- 4-  Hip adduction      Hip internal rotation      Hip external rotation      Knee flexion 5 5  Knee extension 5 5  Ankle dorsiflexion 4+ 4+  Ankle plantarflexion      Ankle inversion      Ankle eversion      Great toe extension 4+ 4+  (* = pain; Blank rows = not tested)     Sensation Grossly intact to light touch bilateral LEs as determined by testing dermatomes L2-S2. Proprioception, and hot/cold testing deferred on this date.     Reflexes R/L Knee Jerk (L3/4): 2+/2+  Ankle Jerk (S1/2): 3+/3+      Muscle Length Hamstrings: R: Positive, lacking 30 deg L: Positive, lacking 45 deg [place] (quadriceps): R: Positive L: Positive Thomas (hip flexors): R: Not examined L:  Not examined       Palpation   Location LEFT  RIGHT           Lumbar paraspinals 1 1  Quadratus Lumborum  Gluteus Maximus      Gluteus Medius   2  Deep hip external rotators      PSIS      Fortin's Area (SIJ)      Greater Trochanter      (Blank rows = not tested) Graded on 0-4 scale (0 = no pain, 1 = pain, 2 = pain with wincing/grimacing/flinching, 3 = pain with withdrawal, 4 = unwilling to allow palpation), (Blank rows = not tested)     Passive Accessory Intervertebral Motion 02/18/22: Hypomobile T3-T10, L3-5 with no pain at restriction     SPECIAL TESTS Lumbar Radiculopathy and Discogenic: Centralization and Peripheralization (SN 92, -LR 0.12): Not examined Slump (SN 83, -LR 0.32): R: Negative L: Positive SLR (SN 92, -LR 0.29): R: Negative L:  Negative Crossed SLR (SP 90): R: Negative L: Negative   Facet Joint: Extension-Rotation (SN 100, -LR 0.0): R: Negative L: Positive   Lumbar Foraminal Stenosis: Lumbar quadrant (SN 70): R: Positive L: Positive           TODAY'S TREATMENT    SUBJECTIVE: Patient reports no pain in lower lumbar region at arrival. He reports more discomfort affecting his R groin and inner thigh. Patient reports ongoing issues with getting excessively hot in his home. Pt denies cold sweats, chills, fever, nausea/vomiting.     PAIN:  Are you having pain? Yes: NPRS scale: 3/10 Pain location: pain R anterior hip, R adductor region          Manual Therapy - for symptom modulation, soft tissue sensitivity and mobility, joint mobility, ROM    Supine:  STM/DTM and IASTM with Theraband roller: R TFL, distal iliopsoas, proximal to mid-substance rectus femoris and vastus medialis; x 10 minutes PROM into flexion, ER, abduction; x 1 minute Passive prone knee bend stretch; x 1 minute   *not today* Gentle piriformis stretching with hip in ER; 2x30 sec STM/DTM bilateral erector spinae L3-L5; x 15 minutes CPA and L UPA gr II for pain control, gr  III-IV for mobility L3-5; 2x30 sec at each region for each grade Thoracic mobilization  IASTM with Theraband roller R vastus lateralis and rectus femoris       Therapeutic Exercise - for improved soft tissue flexibility and extensibility as needed for ROM, improved strength as needed to improve performance of CKC activities/functional movements  NuStep; Level 5, seat 11; x 5 minutes for nervous system downregulation and improved soft tissue extensibility - subjective gathered intermittently during this time, 2 minutes unbilled    Sidelying clamshell, Green Tband; 2x10, on each side  Bridge with Black band around knees for hip abduction moment; 2x12  Swiss ball rollout; Blue ball; x5 ea dir, 5 sec hold; anterior and anterolateral R/L  3-way Hip; 4-lb weights on each foot; x8 ea dir, performed bilaterally    Pt edu: Reviewed HEP and encouraged continued work on repeated movement program for lumbar spine and initiating regular walking regimen.     *not today* Alternating hip flexion isotonic with Green Tband; 2x10 alt R/L, in standing today  Posterior pelvic tilt, hooklying, heavy verbal and tactile cueing, demonstration for technique; 2x10 Repeated flexion in sitting; 1x15  -no effect during, remaining R flank pain and no R thigh pain after Lower trunk rotations; with QL bias (figure-4 position for RLE x 10, then LLE x10) Open book; 1 x 10 on each side Seated hamstring stretch; 2x30 sec              PATIENT EDUCATION:  Education  details: see above for patient education details   Person educated: Patient Education method: Explanation, Demonstration, and Handouts Education comprehension: verbalized understanding and returned demonstration     HOME EXERCISE PROGRAM: Access Code [phone]     ASSESSMENT:   CLINICAL IMPRESSION:   Patient fortunately has reported no significant back pain at arrival for last 2 sessions. He has remaining R anterior hip and groin pain with  taut and tender R TFL, distal iliopsoas, and proximal to mid-substance rectus femoris and vastus medialis. Patient is able to continue hip strengthening and trunk/hip mobility work today without significant c/o pain. Pt is doing well with PT to date, but he does have intermittent episodes of pain at home. Pt will benefit from ongoing HEP and walking program to maintain progress made with PT. Patient has remaining deficits in thoracic and lumbar spine accessory mobility, postural changes, hip flexor and gluteal weakness, and local nociceptive low back and bilateral paraspinal pain without paresthesias.  Patient will benefit from continued skilled PT to address above impairments and improve overall function.   REHAB POTENTIAL: Good   CLINICAL DECISION MAKING: Evolving/moderate complexity   EVALUATION COMPLEXITY: Moderate     GOALS:   SHORT TERM GOALS: Target date: 02/26/2022   Pt will be independent with HEP to improve strength and decrease back pain to improve pain-free function at home and work. Baseline: 02/12/22: Baseline HEP initiated Goal status: INITIAL   Pt will complete walking program at least 3-5 days per week for 10-15 minutes or longer as needed for improved long-term health and to decrease central sensitivity related to chronic pain Baseline: 02/12/22: Initial education given for increase in physical activity and walking program most days of the week.  Goal status: INITIAL     LONG TERM GOALS: Target date: 03/27/2022   Pt will increase FOTO to at least 60 to demonstrate significant improvement in function at home and work related to back pain  Baseline: 02/12/22: 59 (risk-adjusted 46) Goal status: INITIAL   2.  Pt will decrease worst back pain by at least 2 points on the NPRS in order to demonstrate clinically significant reduction in back pain. Baseline: 02/12/22: 7/10 at worst Goal status: INITIAL   3.  Patient will have MMT 4+/5 or greater for all hip muscles tested indicative  of improved strength as needed for improved tolerance of performing yard work and tolerance to loading affected tissues for improved daily physical activity tolerance Baseline: 02/12/22: MMTs for bilateral hips 4- to 4/5 (see table above).  Goal status: INITIAL   4.  Patient will be able to sit and perform standing/ambulatory activity > 1 hour as needed for improved ability to participate in social activities and leisure activities as well as complete community outings/errands Baseline: 02/12/22: Pt has to stop sitting at 1 hour due to low back pain.  Goal status: INITIAL     PLAN: PT FREQUENCY: 2x/week   PT DURATION: 8 weeks   PLANNED INTERVENTIONS: Therapeutic exercises, Therapeutic activity, Neuromuscular re-education, Patient/Family education, Dry Needling, Electrical stimulation, Spinal manipulation, Spinal mobilization, Cryotherapy, Moist heat, Traction, and Manual therapy   PLAN FOR NEXT SESSION: Continued with manual therapy/STM to improve sensitivity of lumbar paraspinals, gluteal musculature, and R anterior hip. Continue with gentle graded movement with gradual introduction of hip strengthening as tolerated.       Valentina Gu, PT, DPT #[id number]  Eilleen Kempf, PT 03/12/2022, 1:57 PM

## 2022-03-17 ENCOUNTER — Ambulatory Visit: Payer: Medicaid Other

## 2022-03-17 DIAGNOSIS — M6281 Muscle weakness (generalized): Secondary | ICD-10-CM

## 2022-03-17 DIAGNOSIS — M25551 Pain in right hip: Secondary | ICD-10-CM

## 2022-03-17 DIAGNOSIS — M25552 Pain in left hip: Secondary | ICD-10-CM

## 2022-03-17 DIAGNOSIS — M5459 Other low back pain: Secondary | ICD-10-CM

## 2022-03-17 NOTE — Therapy (Signed)
OUTPATIENT PHYSICAL THERAPY TREATMENT NOTE   Patient Name: Johnny Williams MRN: [id number] DOB:04-14-1961, 61 y.o., male Today's Date: 03/17/2022   END OF SESSION:   PT End of Session - 03/17/22 0846     Visit Number 9    Number of Visits 13    Date for PT Re-Evaluation 03/27/22    Authorization Type [employer], VL based on auth    Progress Note Due on Visit 10    PT Start Time 0845    PT Stop Time 0929    PT Time Calculation (min) 44 min    Activity Tolerance Patient limited by pain;Patient tolerated treatment well    Behavior During Therapy [facility] for tasks assessed/performed                 Past Medical History:  Diagnosis Date   Anxiety    Arthritis    Chronic insomnia    COPD (chronic obstructive pulmonary disease) (HCC)    Dyspnea    Esophageal stricture    Esophageal varices (HCC)    GERD (gastroesophageal reflux disease)    Hyperlipidemia    Hypertension    Joint ache    Pre-diabetes    Reflux esophagitis    Sleep apnea    Past Surgical History:  Procedure Laterality Date   COLONOSCOPY WITH PROPOFOL N/A 09/06/2019   Procedure: COLONOSCOPY WITH PROPOFOL;  Surgeon: Lucilla Lame, MD;  Location: [facility] ENDOSCOPY;  Service: Endoscopy;  Laterality: N/A;   ESOPHAGOGASTRODUODENOSCOPY (EGD) WITH PROPOFOL N/A 09/06/2019   Procedure: ESOPHAGOGASTRODUODENOSCOPY (EGD) WITH PROPOFOL;  Surgeon: Lucilla Lame, MD;  Location: [facility] ENDOSCOPY;  Service: Endoscopy;  Laterality: N/A;   TOTAL HIP ARTHROPLASTY Right 05/19/2018   Procedure: TOTAL HIP ARTHROPLASTY;  Surgeon: Dereck Leep, MD;  Location: [facility] ORS;  Service: Orthopedics;  Laterality: Right;   TOTAL HIP ARTHROPLASTY Left 11/16/2019   Procedure: TOTAL HIP ARTHROPLASTY;  Surgeon: Dereck Leep, MD;  Location: [facility] ORS;  Service: Orthopedics;  Laterality: Left;  REQUESTING 3HRS   Patient Active Problem List   Diagnosis Date Noted   Gastroesophageal reflux disease with esophagitis    Screen for colon cancer     Polyp of sigmoid colon    Erythrocytosis 07/29/2019   2-vessel coronary artery disease 07/07/2019   Aortic atherosclerosis ([place]) 07/07/2019   Cholelithiasis 07/07/2019   Obesity (BMI 35.0-39.9 without comorbidity) 07/07/2019   Vitamin B12 deficiency 07/07/2019   H/O total hip arthroplasty 05/19/2018   Left lumbar radiculitis 11/09/2017   DDD (degenerative disc disease), lumbosacral 11/09/2017   Vitamin D insufficiency 10/12/2017   Osteoarthritis of hip (Bilateral) (L>R) 10/12/2017   Lumbar facet syndrome (Bilateral) (L>R) 10/12/2017   Anxiety 09/29/2017   Chronic insomnia 09/29/2017   Cluster headaches 09/29/2017   COPD (chronic obstructive pulmonary disease) ([place]) 09/29/2017   Esophageal varices ([place]) 09/29/2017   Essential hypertension 09/29/2017   Sleep apnea 09/29/2017   Smoker 09/29/2017   Stricture of esophagus 09/29/2017   Type 2 diabetes mellitus without complication ([place]) 82/95/6213   Chronic low back pain (Secondary Area of Pain) (Bilateral) with sciatica (Left) 09/29/2017   Chronic lower extremity pain (Tertiary Area of Pain) (Bilateral) (L>R) 09/29/2017   Chronic hip pain (Primary Area of Pain) (Bilateral) (L>R) 09/29/2017   Chronic knee pain (Fourth Area of Pain) (Bilateral) (L>R) 09/29/2017   Chronic pain syndrome 09/29/2017   Pharmacologic therapy 09/29/2017   Disorder of skeletal system 09/29/2017   Problems influencing health status 09/29/2017   High risk medication use 05/11/2014  Gastritis and duodenitis 11/07/2013   Hyperlipidemia, unspecified 11/07/2013   Variants of migraine, not elsewhere classified, with intractable migraine, so stated, without mention of status migrainosus 11/07/2013        PCP: [facility], [place] REFERRING PROVIDER: Sherren Kerns Meeler, FNP   REFERRING DIAG:  [phone] (ICD-10-CM) - Spinal stenosis, lumbar region with neurogenic claudication  M54.16 (ICD-10-CM) - Radiculopathy, lumbar region  M51.36 (ICD-10-CM) - Other  intervertebral disc degeneration, lumbar region      THERAPY DIAG:  Other low back pain   Pain in right hip   Pain in left hip   Muscle weakness (generalized)   Rationale for Evaluation and Treatment Rehabilitation   PERTINENT HISTORY: Patient is a 61 year old male with primary complaint of low back pain. Pt reports losing his job back in 2010. He reports in 2015, he started having issues with his legs and back. Pt reports pulling something in R anterolateral hip when walking on treadmill. Pt reports having therapy at [facility] and having alleviation of R hip/leg pain with working on his spine. Hx of bilateral THA; pt has remaining pain in his R hip s/p THA from 2019. 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 He denies paresthesias in lower limbs; he does report numbness in R hip. He reports constant pain in his R hip. He reports he is sedentary and watches TV during much of the day. He had hx of ESI that did alleviate his symptoms; however, he reports his pain returned the following morning.    Pain:  Pain Intensity: Present: 5/10, Best: 0/10, Worst: 7/10 Pain location: Axial low back and R paraspinal, R lateral thigh/lateral hip Pain Quality: constant in R hip, sharp pain in low back  Numbness/Tingling: Yes, R hip Focal Weakness: Yes, "muscles in legs have gone to sleep, because I'm not as active as I used to be"  Aggravating factors: prolonged sitting, sedentary activity, cold weather  Relieving factors: on the move 24-hour pain behavior: worse in AM  How long can you sit: 1 hour History of prior back injury, pain, surgery, or therapy: Yes, hx bilat THA Falls: Has patient fallen in last 6 months? No Follow-up appointment with MD: Yes, f/u with Whitney Meeler, date unknown   Imaging: Yes    MRI Lumbar Spine 10/12/21 Disc spaces: Degenerative disease with disc height loss throughout the thoracolumbar spine.    T12-L1: Mild broad-based disc bulge. No foraminal or central canal stenosis.   L1-L2: Mild broad-based disc bulge. No foraminal or central canal stenosis.   L2-L3: Mild broad-based disc bulge flattening the ventral thecal sac. Mild bilateral facet arthropathy. Mild spinal stenosis. No significant foraminal or central canal stenosis.   L3-L4: Broad-based disc bulge flattening ventral thecal sac. Mild bilateral facet arthropathy. Moderate spinal stenosis. Bilateral subarticular recess stenosis. Moderate bilateral foraminal stenosis.   L4-L5: Broad-based disc bulge. Mild bilateral facet arthropathy. Bilateral subarticular recess stenosis. Moderate bilateral foraminal stenosis, right greater than left. Mild spinal stenosis.   L5-S1: Broad-based disc osteophyte complex. Mild bilateral facet arthropathy. Moderate left foraminal stenosis. Mild right foraminal stenosis. No spinal stenosis.   IMPRESSION: 1. Diffuse lumbar spine spondylosis as described above. 2. No acute osseous injury of the lumbar spine.     By: Kathreen Devoid M.D.   On: 10/12/2021 08:46     Prior level of function: Independent Occupational demands: Pt on disability  Hobbies: yard work, fishing, Location manager  Red flags (bowel/bladder changes, saddle paresthesia, personal history of cancer, h/o spinal tumors, h/o compression fx, h/o abdominal aneurysm, abdominal pain, chills/fever, night sweats, nausea, vomiting, unrelenting pain, first onset of insidious LBP <20 y/o): Negative     Precautions: None   Weight Bearing Restrictions: No   Living Environment Lives with: lives with their family, lives with 76 year old mother Lives in: Mobile home   Patient Goals: Improved pain     PRECAUTIONS: None     OBJECTIVE: (objective measures completed at initial evaluation unless otherwise dated)     Patient Surveys  FOTO 73 (risk-adjusted 66), predicted outcome score of 60     Cognition Patient is oriented to  person, place, and time.  Recent memory is intact.  Remote memory is intact.  Attention span and concentration are intact.  Expressive speech is intact.  Patient's fund of knowledge is within normal limits for educational level.                            Gross Musculoskeletal Assessment Tremor: None Bulk: Normal Tone: Normal No visible step-off along spinal column, no signs of scoliosis     GAIT: Forward flexed posture, mild guarded posture with dec arm swing and trunk rotation       AROM          AROM (Normal range in degrees) AROM  02/12/2022  Lumbar    Flexion (65) WNL  Extension (30) WNL  Right lateral flexion (25) WNL  Left lateral flexion (25) WNL*  Right rotation (30) WNL  Left rotation (30) WNL         Hip Right Left  Flexion (125) 120 (pain at 90 deg hip flexion along R greater trochanter) WNL  Extension (15)      Abduction (40) WNL WNL  Adduction       Internal Rotation (45)      External Rotation (45) WNL WNL         Ankle      Dorsiflexion (20) WNL WNL  Plantarflexion (50)      Inversion (35)      Eversion (15      (* = pain; Blank rows = not tested)     LE MMT:   MMT (out of 5) Right 02/12/2022 Left 02/12/2022  Hip flexion 4 4*  Hip extension 4- 4-  Hip abduction 4- 4-  Hip adduction      Hip internal rotation      Hip external rotation      Knee flexion 5 5  Knee extension 5 5  Ankle dorsiflexion 4+ 4+  Ankle plantarflexion      Ankle inversion      Ankle eversion      Great toe extension 4+ 4+  (* = pain; Blank rows = not tested)     Sensation Grossly intact to light touch bilateral LEs as determined by testing dermatomes L2-S2. Proprioception, and hot/cold testing deferred on this date.     Reflexes R/L Knee Jerk (L3/4): 2+/2+  Ankle Jerk (S1/2): 3+/3+      Muscle Length Hamstrings: R: Positive, lacking 30 deg L: Positive, lacking 45 deg [place] (quadriceps): R: Positive L: Positive Thomas (hip flexors): R: Not examined L:  Not examined       Palpation   Location LEFT  RIGHT           Lumbar paraspinals 1 1  Quadratus Lumborum  Gluteus Maximus      Gluteus Medius   2  Deep hip external rotators      PSIS      Fortin's Area (SIJ)      Greater Trochanter      (Blank rows = not tested) Graded on 0-4 scale (0 = no pain, 1 = pain, 2 = pain with wincing/grimacing/flinching, 3 = pain with withdrawal, 4 = unwilling to allow palpation), (Blank rows = not tested)     Passive Accessory Intervertebral Motion 02/18/22: Hypomobile T3-T10, L3-5 with no pain at restriction     SPECIAL TESTS Lumbar Radiculopathy and Discogenic: Centralization and Peripheralization (SN 92, -LR 0.12): Not examined Slump (SN 83, -LR 0.32): R: Negative L: Positive SLR (SN 92, -LR 0.29): R: Negative L:  Negative Crossed SLR (SP 90): R: Negative L: Negative   Facet Joint: Extension-Rotation (SN 100, -LR 0.0): R: Negative L: Positive   Lumbar Foraminal Stenosis: Lumbar quadrant (SN 70): R: Positive L: Positive           TODAY'S TREATMENT    SUBJECTIVE: Patient remains painless in low back prior to treatment. Reports R hip pain is 3/10. Ambulates with antalgic gait on RLE.     PAIN:  Are you having pain? Yes: NPRS scale: 3/10 Pain location: pain R anterior hip, R adductor region          Manual Therapy - for symptom modulation, soft tissue sensitivity and mobility, joint mobility, ROM    Supine:  STM/DTM and IASTM with Theraband roller: R TFL, distal iliopsoas, proximal to mid-substance rectus femoris and vastus medialis; x 10 minutes PROM into flexion, ER, abduction; x 1 minute Passive prone knee bend stretch with knee propped on towel; x 1 minute      Therapeutic Exercise - for improved soft tissue flexibility and extensibility as needed for ROM, improved strength as needed to improve performance of CKC activities/functional movements  NuStep; Level 5, seat 11; x 6 minutes for nervous system downregulation and  improved soft tissue extensibility.   Side lying clamshell, blue Tband; 3x12, on each side. VC's for form/technique with eccentric control  Repeated B knees to chest: x20   Bridge with Black band around knees for hip abduction moment; 2x12  Forward/R/L reaches for lumbar mobility: x12/direction.   Re-assessment of gait post treatment. Reduced antalgic gait post intervention.      PATIENT EDUCATION:  Education details: see above for patient education details   Person educated: Patient Education method: Explanation, Demonstration, and Handouts Education comprehension: verbalized understanding and returned demonstration     HOME EXERCISE PROGRAM: Access Code [phone]     ASSESSMENT:   CLINICAL IMPRESSION: Continuing PT POC with focus on manual techniques and hip exercise. Pt demonstrates reduced antalgic gait post treatment. Able to tolerate progression of resistance and volume of exercise this date but does have some increase in R hip pain during exercise that improves to baseline at rest. Encouraged pt to continue PT POC as able. Pt will require progress note next session to assess progress in POC towards goals.     REHAB POTENTIAL: Good   CLINICAL DECISION MAKING: Evolving/moderate complexity   EVALUATION COMPLEXITY: Moderate     GOALS:   SHORT TERM GOALS: Target date: 02/26/2022   Pt will be independent with HEP to improve strength and decrease back pain to improve pain-free function at home and work. Baseline: 02/12/22: Baseline HEP initiated Goal status: INITIAL   Pt will complete walking program at least 3-5 days  per week for 10-15 minutes or longer as needed for improved long-term health and to decrease central sensitivity related to chronic pain Baseline: 02/12/22: Initial education given for increase in physical activity and walking program most days of the week.  Goal status: INITIAL     LONG TERM GOALS: Target date: 03/27/2022   Pt will increase FOTO to at  least 60 to demonstrate significant improvement in function at home and work related to back pain  Baseline: 02/12/22: 59 (risk-adjusted 46) Goal status: INITIAL   2.  Pt will decrease worst back pain by at least 2 points on the NPRS in order to demonstrate clinically significant reduction in back pain. Baseline: 02/12/22: 7/10 at worst Goal status: INITIAL   3.  Patient will have MMT 4+/5 or greater for all hip muscles tested indicative of improved strength as needed for improved tolerance of performing yard work and tolerance to loading affected tissues for improved daily physical activity tolerance Baseline: 02/12/22: MMTs for bilateral hips 4- to 4/5 (see table above).  Goal status: INITIAL   4.  Patient will be able to sit and perform standing/ambulatory activity > 1 hour as needed for improved ability to participate in social activities and leisure activities as well as complete community outings/errands Baseline: 02/12/22: Pt has to stop sitting at 1 hour due to low back pain.  Goal status: INITIAL     PLAN: PT FREQUENCY: 2x/week   PT DURATION: 8 weeks   PLANNED INTERVENTIONS: Therapeutic exercises, Therapeutic activity, Neuromuscular re-education, Patient/Family education, Dry Needling, Electrical stimulation, Spinal manipulation, Spinal mobilization, Cryotherapy, Moist heat, Traction, and Manual therapy   PLAN FOR NEXT SESSION: Progress note. Continued with manual therapy/STM to improve sensitivity of lumbar paraspinals, gluteal musculature, and R anterior hip. Continue with gentle graded movement with gradual introduction of hip strengthening as tolerated.       Salem Caster. Fairly IV, PT, DPT Physical Therapist- [facility]  03/17/2022, 9:48 AM

## 2022-03-19 ENCOUNTER — Ambulatory Visit: Payer: Medicaid Other | Admitting: Physical Therapy

## 2022-03-20 ENCOUNTER — Ambulatory Visit: Payer: Medicaid Other

## 2022-03-20 DIAGNOSIS — M5459 Other low back pain: Secondary | ICD-10-CM

## 2022-03-20 DIAGNOSIS — M25552 Pain in left hip: Secondary | ICD-10-CM

## 2022-03-20 DIAGNOSIS — M6281 Muscle weakness (generalized): Secondary | ICD-10-CM

## 2022-03-20 DIAGNOSIS — M25551 Pain in right hip: Secondary | ICD-10-CM

## 2022-03-20 NOTE — Therapy (Signed)
OUTPATIENT PHYSICAL THERAPY TREATMENT NOTE/PROGRESS NOTE  Reporting period: 02/12/22 - 03/20/22   Patient Name: Edwyn Inclan MRN: [id number] DOB:December 24, 1960, 61 y.o., male Today's Date: 03/20/2022   END OF SESSION:   PT End of Session - 03/20/22 1059     Visit Number 10    Number of Visits 13    Date for PT Re-Evaluation 03/27/22    Authorization Type [employer], VL based on auth    Progress Note Due on Visit 10    PT Start Time 1105    PT Stop Time 1150    PT Time Calculation (min) 45 min    Activity Tolerance Patient limited by pain;Patient tolerated treatment well    Behavior During Therapy [facility] for tasks assessed/performed                 Past Medical History:  Diagnosis Date   Anxiety    Arthritis    Chronic insomnia    COPD (chronic obstructive pulmonary disease) (HCC)    Dyspnea    Esophageal stricture    Esophageal varices (HCC)    GERD (gastroesophageal reflux disease)    Hyperlipidemia    Hypertension    Joint ache    Pre-diabetes    Reflux esophagitis    Sleep apnea    Past Surgical History:  Procedure Laterality Date   COLONOSCOPY WITH PROPOFOL N/A 09/06/2019   Procedure: COLONOSCOPY WITH PROPOFOL;  Surgeon: Lucilla Lame, MD;  Location: [facility] ENDOSCOPY;  Service: Endoscopy;  Laterality: N/A;   ESOPHAGOGASTRODUODENOSCOPY (EGD) WITH PROPOFOL N/A 09/06/2019   Procedure: ESOPHAGOGASTRODUODENOSCOPY (EGD) WITH PROPOFOL;  Surgeon: Lucilla Lame, MD;  Location: [facility] ENDOSCOPY;  Service: Endoscopy;  Laterality: N/A;   TOTAL HIP ARTHROPLASTY Right 05/19/2018   Procedure: TOTAL HIP ARTHROPLASTY;  Surgeon: Dereck Leep, MD;  Location: [facility] ORS;  Service: Orthopedics;  Laterality: Right;   TOTAL HIP ARTHROPLASTY Left 11/16/2019   Procedure: TOTAL HIP ARTHROPLASTY;  Surgeon: Dereck Leep, MD;  Location: [facility] ORS;  Service: Orthopedics;  Laterality: Left;  REQUESTING 3HRS   Patient Active Problem List   Diagnosis Date Noted   Gastroesophageal reflux  disease with esophagitis    Screen for colon cancer    Polyp of sigmoid colon    Erythrocytosis 07/29/2019   2-vessel coronary artery disease 07/07/2019   Aortic atherosclerosis ([place]) 07/07/2019   Cholelithiasis 07/07/2019   Obesity (BMI 35.0-39.9 without comorbidity) 07/07/2019   Vitamin B12 deficiency 07/07/2019   H/O total hip arthroplasty 05/19/2018   Left lumbar radiculitis 11/09/2017   DDD (degenerative disc disease), lumbosacral 11/09/2017   Vitamin D insufficiency 10/12/2017   Osteoarthritis of hip (Bilateral) (L>R) 10/12/2017   Lumbar facet syndrome (Bilateral) (L>R) 10/12/2017   Anxiety 09/29/2017   Chronic insomnia 09/29/2017   Cluster headaches 09/29/2017   COPD (chronic obstructive pulmonary disease) ([place]) 09/29/2017   Esophageal varices ([place]) 09/29/2017   Essential hypertension 09/29/2017   Sleep apnea 09/29/2017   Smoker 09/29/2017   Stricture of esophagus 09/29/2017   Type 2 diabetes mellitus without complication ([place]) 97/35/3299   Chronic low back pain (Secondary Area of Pain) (Bilateral) with sciatica (Left) 09/29/2017   Chronic lower extremity pain (Tertiary Area of Pain) (Bilateral) (L>R) 09/29/2017   Chronic hip pain (Primary Area of Pain) (Bilateral) (L>R) 09/29/2017   Chronic knee pain (Fourth Area of Pain) (Bilateral) (L>R) 09/29/2017   Chronic pain syndrome 09/29/2017   Pharmacologic therapy 09/29/2017   Disorder of skeletal system 09/29/2017   Problems influencing health status 09/29/2017  High risk medication use 05/11/2014   Gastritis and duodenitis 11/07/2013   Hyperlipidemia, unspecified 11/07/2013   Variants of migraine, not elsewhere classified, with intractable migraine, so stated, without mention of status migrainosus 11/07/2013        PCP: [facility], [place] REFERRING PROVIDER: Sherren Kerns Meeler, FNP   REFERRING DIAG:  [phone] (ICD-10-CM) - Spinal stenosis, lumbar region with neurogenic claudication  M54.16 (ICD-10-CM) -  Radiculopathy, lumbar region  M51.36 (ICD-10-CM) - Other intervertebral disc degeneration, lumbar region      THERAPY DIAG:  Other low back pain   Pain in right hip   Pain in left hip   Muscle weakness (generalized)   Rationale for Evaluation and Treatment Rehabilitation   PERTINENT HISTORY: Patient is a 61 year old male with primary complaint of low back pain. Pt reports losing his job back in 2010. He reports in 2015, he started having issues with his legs and back. Pt reports pulling something in R anterolateral hip when walking on treadmill. Pt reports having therapy at [facility] and having alleviation of R hip/leg pain with working on his spine. Hx of bilateral THA; pt has remaining pain in his R hip s/p THA from 2019. 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 He denies paresthesias in lower limbs; he does report numbness in R hip. He reports constant pain in his R hip. He reports he is sedentary and watches TV during much of the day. He had hx of ESI that did alleviate his symptoms; however, he reports his pain returned the following morning.    Pain:  Pain Intensity: Present: 5/10, Best: 0/10, Worst: 7/10 Pain location: Axial low back and R paraspinal, R lateral thigh/lateral hip Pain Quality: constant in R hip, sharp pain in low back  Numbness/Tingling: Yes, R hip Focal Weakness: Yes, "muscles in legs have gone to sleep, because I'm not as active as I used to be"  Aggravating factors: prolonged sitting, sedentary activity, cold weather  Relieving factors: on the move 24-hour pain behavior: worse in AM  How long can you sit: 1 hour History of prior back injury, pain, surgery, or therapy: Yes, hx bilat THA Falls: Has patient fallen in last 6 months? No Follow-up appointment with MD: Yes, f/u with Whitney Meeler, date unknown   Imaging: Yes    MRI Lumbar Spine 10/12/21 Disc spaces: Degenerative disease with disc  height loss throughout the thoracolumbar spine.   T12-L1: Mild broad-based disc bulge. No foraminal or central canal stenosis.   L1-L2: Mild broad-based disc bulge. No foraminal or central canal stenosis.   L2-L3: Mild broad-based disc bulge flattening the ventral thecal sac. Mild bilateral facet arthropathy. Mild spinal stenosis. No significant foraminal or central canal stenosis.   L3-L4: Broad-based disc bulge flattening ventral thecal sac. Mild bilateral facet arthropathy. Moderate spinal stenosis. Bilateral subarticular recess stenosis. Moderate bilateral foraminal stenosis.   L4-L5: Broad-based disc bulge. Mild bilateral facet arthropathy. Bilateral subarticular recess stenosis. Moderate bilateral foraminal stenosis, right greater than left. Mild spinal stenosis.   L5-S1: Broad-based disc osteophyte complex. Mild bilateral facet arthropathy. Moderate left foraminal stenosis. Mild right foraminal stenosis. No spinal stenosis.   IMPRESSION: 1. Diffuse lumbar spine spondylosis as described above. 2. No acute osseous injury of the lumbar spine.     By: Kathreen Devoid M.D.   On: 10/12/2021 08:46     Prior level of function: Independent Occupational demands: Pt on disability  Hobbies: yard work, fishing, Probation officer flags (bowel/bladder changes, saddle paresthesia, personal history of cancer, h/o spinal tumors, h/o compression fx, h/o abdominal aneurysm, abdominal pain, chills/fever, night sweats, nausea, vomiting, unrelenting pain, first onset of insidious LBP <20 y/o): Negative     Precautions: None   Weight Bearing Restrictions: No   Living Environment Lives with: lives with their family, lives with 87 year old mother Lives in: Mobile home   Patient Goals: Improved pain     PRECAUTIONS: None     OBJECTIVE: (objective measures completed at initial evaluation unless otherwise dated)     Patient Surveys  FOTO 51 (risk-adjusted 84), predicted outcome score  of 60     Cognition Patient is oriented to person, place, and time.  Recent memory is intact.  Remote memory is intact.  Attention span and concentration are intact.  Expressive speech is intact.  Patient's fund of knowledge is within normal limits for educational level.                            Gross Musculoskeletal Assessment Tremor: None Bulk: Normal Tone: Normal No visible step-off along spinal column, no signs of scoliosis     GAIT: Forward flexed posture, mild guarded posture with dec arm swing and trunk rotation       AROM          AROM (Normal range in degrees) AROM  02/12/2022  Lumbar    Flexion (65) WNL  Extension (30) WNL  Right lateral flexion (25) WNL  Left lateral flexion (25) WNL*  Right rotation (30) WNL  Left rotation (30) WNL         Hip Right Left  Flexion (125) 120 (pain at 90 deg hip flexion along R greater trochanter) WNL  Extension (15)      Abduction (40) WNL WNL  Adduction       Internal Rotation (45)      External Rotation (45) WNL WNL         Ankle      Dorsiflexion (20) WNL WNL  Plantarflexion (50)      Inversion (35)      Eversion (15      (* = pain; Blank rows = not tested)     LE MMT:   MMT (out of 5) Right 02/12/2022 Left 02/12/2022 Right  03/20/22 Left  03/20/22  Hip flexion 4 4* 4 4  Hip extension 4- 4- 4 4  Hip abduction 4- 4- 4* 4-  Hip adduction        Hip internal rotation        Hip external rotation        Knee flexion '5 5 5 5  '$ Knee extension '5 5 5 5  '$ Ankle dorsiflexion 4+ 4+    Ankle plantarflexion        Ankle inversion        Ankle eversion        Great toe extension 4+ 4+    (* = pain; Blank rows = not tested)     Sensation Grossly intact to light touch bilateral LEs as determined by testing dermatomes L2-S2. Proprioception, and hot/cold testing deferred on this date.     Reflexes R/L Knee Jerk (L3/4): 2+/2+  Ankle Jerk (S1/2): 3+/3+      Muscle Length Hamstrings: R: Positive, lacking 30 deg  L: Positive, lacking 45 deg [place] (quadriceps): R: Positive L: Positive Thomas (hip  flexors): R: Not examined L: Not examined       Palpation   Location LEFT  RIGHT           Lumbar paraspinals 1 1  Quadratus Lumborum      Gluteus Maximus      Gluteus Medius   2  Deep hip external rotators      PSIS      Fortin's Area (SIJ)      Greater Trochanter      (Blank rows = not tested) Graded on 0-4 scale (0 = no pain, 1 = pain, 2 = pain with wincing/grimacing/flinching, 3 = pain with withdrawal, 4 = unwilling to allow palpation), (Blank rows = not tested)     Passive Accessory Intervertebral Motion 02/18/22: Hypomobile T3-T10, L3-5 with no pain at restriction     SPECIAL TESTS Lumbar Radiculopathy and Discogenic: Centralization and Peripheralization (SN 92, -LR 0.12): Not examined Slump (SN 83, -LR 0.32): R: Negative L: Positive SLR (SN 92, -LR 0.29): R: Negative L:  Negative Crossed SLR (SP 90): R: Negative L: Negative   Facet Joint: Extension-Rotation (SN 100, -LR 0.0): R: Negative L: Positive   Lumbar Foraminal Stenosis: Lumbar quadrant (SN 70): R: Positive L: Positive           TODAY'S TREATMENT    SUBJECTIVE: Pt reports sleeping in on Wednesday thus coming in today. 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     PAIN:  Are you having pain? Yes: NPRS scale: 3.5/10 Pain location: pain R anterior hip, R adductor region and lower back        TREATMENT 03/20/22:   There.ex:  NuStep; Level 5, seat 11; x 6 minutes for nervous system downregulation and improved soft tissue extensibility. Discussion on subjective based short term and long term goals.  All goals reviewed. See goals section and clinical impression for details.    Repeated B knees to chest: x20  Seated physioball lumbar roll outs:   Forward: x20   R/L deviations: x20  Standing hip abduction with 3# AW's: x15/LE. Progressed to 5# AW's x15/LE. Required mod multimodal cuing for  upright posture      Manual Therapy - for symptom modulation, soft tissue sensitivity and mobility, joint mobility, ROM    Supine:  STM/DTM and IASTM with Theraband roller: R TFL, distal iliopsoas, proximal to mid-substance rectus femoris and vastus medialis; x 8  PROM into flexion, ER, abduction; x 1 minute  PROM knee to chest with hip adduction: 2x10 seconds, onset of hip pain.   Prone CPA's T7-L5, Grade(s) 3-4 for improved spinal mobility, x3 bouts/segment with 10 sec bouts/segment.         PATIENT EDUCATION:  Education details: see above for patient education details. Progress towards goals   Person educated: Patient Education method: Explanation, Demonstration, and Handouts Education comprehension: verbalized understanding and returned demonstration     HOME EXERCISE PROGRAM: Access Code [phone]     ASSESSMENT:   CLINICAL IMPRESSION: Assessment of goals performed as pt on 10th visit. Pt has achieved pain goal with reduction of pain from 7/10 NPS to 4/10 NPS for worst pain. Pt does however remain limited in sitting and standing/walking tolerance due to his LBP and hip pain thus limiting performance in walking program and HEP. Muscle strength remains grossly ISQ compared to eval measurements however there may be discrepancies due to inter rater reliability. 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 Patient's condition has the potential to improve in response to therapy. Maximum improvement is yet to be obtained. The anticipated improvement is attainable and reasonable in a generally predictable time. Will continue POC  as prescribed to work towards remaining goals and deficits.   REHAB POTENTIAL: Good   CLINICAL DECISION MAKING: Evolving/moderate complexity   EVALUATION COMPLEXITY: Moderate     GOALS:   SHORT TERM GOALS: Target date: 02/26/2022   Pt will be independent with HEP to improve  strength and decrease back pain to improve pain-free function at home and work. Baseline: 02/12/22: Baseline HEP initiated; 03/20/22 Indep, but not performing as prescribed.  Goal status: ON GOING   Pt will complete walking program at least 3-5 days per week for 10-15 minutes or longer as needed for improved long-term health and to decrease central sensitivity related to chronic pain Baseline: 02/12/22: Initial education given for increase in physical activity and walking program most days of the week. ; 03/20/22: 3x/week, only 1/8th mile due to pain. 15-20 minutes but unable to progress due to pain.  Goal status: ON GOING     LONG TERM GOALS: Target date: 03/27/2022   Pt will increase FOTO to at least 60 to demonstrate significant improvement in function at home and work related to back pain  Baseline: 02/12/22: 59 (risk-adjusted 46); 03/20/22: 44 Goal status: ON GOING   2.  Pt will decrease worst back pain by at least 2 points on the NPRS in order to demonstrate clinically significant reduction in back pain. Baseline: 02/12/22: 7/10 at worst; 02/28/22: 4/10 NPS  Goal status: ACHIEVED   3.  Patient will have MMT 4+/5 or greater for all hip muscles tested indicative of improved strength as needed for improved tolerance of performing yard work and tolerance to loading affected tissues for improved daily physical activity tolerance Baseline: 02/12/22: MMTs for bilateral hips 4- to 4/5 (see table above). ; 03/20/22 see chart above. Grossly similar to eval  Goal status: ON GOING   4.  Patient will be able to sit and perform standing/ambulatory activity > 1 hour as needed for improved ability to participate in social activities and leisure activities as well as complete community outings/errands Baseline: 02/12/22: Pt has to stop sitting at 1 hour due to low back pain.; 03/20/22: sitting 1 hour, unable to do standing and walking tasks > 30 minutes. Reports 4/10 NPS.   Goal status: On going     PLAN: PT  FREQUENCY: 2x/week   PT DURATION: 8 weeks   PLANNED INTERVENTIONS: Therapeutic exercises, Therapeutic activity, Neuromuscular re-education, Patient/Family education, Dry Needling, Electrical stimulation, Spinal manipulation, Spinal mobilization, Cryotherapy, Moist heat, Traction, and Manual therapy   PLAN FOR NEXT SESSION: Continued with manual therapy/STM to improve sensitivity of lumbar paraspinals, gluteal musculature, and R anterior hip. Continue with gentle graded movement with gradual introduction of hip strengthening as tolerated.       Salem Caster. Fairly IV, PT, DPT Physical Therapist- [facility]  03/20/2022, 12:02 PM

## 2022-03-24 ENCOUNTER — Ambulatory Visit: Payer: Medicaid Other | Attending: Family Medicine

## 2022-03-24 DIAGNOSIS — M25551 Pain in right hip: Secondary | ICD-10-CM | POA: Diagnosis present

## 2022-03-24 DIAGNOSIS — M6281 Muscle weakness (generalized): Secondary | ICD-10-CM | POA: Insufficient documentation

## 2022-03-24 DIAGNOSIS — M25552 Pain in left hip: Secondary | ICD-10-CM | POA: Insufficient documentation

## 2022-03-24 DIAGNOSIS — M5459 Other low back pain: Secondary | ICD-10-CM | POA: Diagnosis not present

## 2022-03-24 NOTE — Therapy (Signed)
OUTPATIENT PHYSICAL THERAPY TREATMENT NOTE    Patient Name: Johnny Williams MRN: [id number] DOB:13-Nov-1960, 61 y.o., male Today's Date: 03/24/2022   END OF SESSION:   PT End of Session - 03/24/22 1318     Visit Number 11    Number of Visits 13    Date for PT Re-Evaluation 03/27/22    Authorization Type [employer], VL based on auth    Progress Note Due on Visit 10    PT Start Time 0845    PT Stop Time 0930    PT Time Calculation (min) 45 min    Activity Tolerance Patient limited by pain;Patient tolerated treatment well    Behavior During Therapy [facility] for tasks assessed/performed                 Past Medical History:  Diagnosis Date   Anxiety    Arthritis    Chronic insomnia    COPD (chronic obstructive pulmonary disease) (HCC)    Dyspnea    Esophageal stricture    Esophageal varices (HCC)    GERD (gastroesophageal reflux disease)    Hyperlipidemia    Hypertension    Joint ache    Pre-diabetes    Reflux esophagitis    Sleep apnea    Past Surgical History:  Procedure Laterality Date   COLONOSCOPY WITH PROPOFOL N/A 09/06/2019   Procedure: COLONOSCOPY WITH PROPOFOL;  Surgeon: Lucilla Lame, MD;  Location: [facility] ENDOSCOPY;  Service: Endoscopy;  Laterality: N/A;   ESOPHAGOGASTRODUODENOSCOPY (EGD) WITH PROPOFOL N/A 09/06/2019   Procedure: ESOPHAGOGASTRODUODENOSCOPY (EGD) WITH PROPOFOL;  Surgeon: Lucilla Lame, MD;  Location: [facility] ENDOSCOPY;  Service: Endoscopy;  Laterality: N/A;   TOTAL HIP ARTHROPLASTY Right 05/19/2018   Procedure: TOTAL HIP ARTHROPLASTY;  Surgeon: Dereck Leep, MD;  Location: [facility] ORS;  Service: Orthopedics;  Laterality: Right;   TOTAL HIP ARTHROPLASTY Left 11/16/2019   Procedure: TOTAL HIP ARTHROPLASTY;  Surgeon: Dereck Leep, MD;  Location: [facility] ORS;  Service: Orthopedics;  Laterality: Left;  REQUESTING 3HRS   Patient Active Problem List   Diagnosis Date Noted   Gastroesophageal reflux disease with esophagitis    Screen for colon cancer     Polyp of sigmoid colon    Erythrocytosis 07/29/2019   2-vessel coronary artery disease 07/07/2019   Aortic atherosclerosis ([place]) 07/07/2019   Cholelithiasis 07/07/2019   Obesity (BMI 35.0-39.9 without comorbidity) 07/07/2019   Vitamin B12 deficiency 07/07/2019   H/O total hip arthroplasty 05/19/2018   Left lumbar radiculitis 11/09/2017   DDD (degenerative disc disease), lumbosacral 11/09/2017   Vitamin D insufficiency 10/12/2017   Osteoarthritis of hip (Bilateral) (L>R) 10/12/2017   Lumbar facet syndrome (Bilateral) (L>R) 10/12/2017   Anxiety 09/29/2017   Chronic insomnia 09/29/2017   Cluster headaches 09/29/2017   COPD (chronic obstructive pulmonary disease) ([place]) 09/29/2017   Esophageal varices ([place]) 09/29/2017   Essential hypertension 09/29/2017   Sleep apnea 09/29/2017   Smoker 09/29/2017   Stricture of esophagus 09/29/2017   Type 2 diabetes mellitus without complication ([place]) 93/23/5573   Chronic low back pain (Secondary Area of Pain) (Bilateral) with sciatica (Left) 09/29/2017   Chronic lower extremity pain (Tertiary Area of Pain) (Bilateral) (L>R) 09/29/2017   Chronic hip pain (Primary Area of Pain) (Bilateral) (L>R) 09/29/2017   Chronic knee pain (Fourth Area of Pain) (Bilateral) (L>R) 09/29/2017   Chronic pain syndrome 09/29/2017   Pharmacologic therapy 09/29/2017   Disorder of skeletal system 09/29/2017   Problems influencing health status 09/29/2017   High risk medication use 05/11/2014  Gastritis and duodenitis 11/07/2013   Hyperlipidemia, unspecified 11/07/2013   Variants of migraine, not elsewhere classified, with intractable migraine, so stated, without mention of status migrainosus 11/07/2013        PCP: [facility], [place] REFERRING PROVIDER: Sherren Kerns Meeler, FNP   REFERRING DIAG:  [phone] (ICD-10-CM) - Spinal stenosis, lumbar region with neurogenic claudication  M54.16 (ICD-10-CM) - Radiculopathy, lumbar region  M51.36 (ICD-10-CM) - Other  intervertebral disc degeneration, lumbar region      THERAPY DIAG:  Other low back pain   Pain in right hip   Pain in left hip   Muscle weakness (generalized)   Rationale for Evaluation and Treatment Rehabilitation   PERTINENT HISTORY: Patient is a 61 year old male with primary complaint of low back pain. Pt reports losing his job back in 2010. He reports in 2015, he started having issues with his legs and back. Pt reports pulling something in R anterolateral hip when walking on treadmill. Pt reports having therapy at [facility] and having alleviation of R hip/leg pain with working on his spine. Hx of bilateral THA; pt has remaining pain in his R hip s/p THA from 2019. 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 He denies paresthesias in lower limbs; he does report numbness in R hip. He reports constant pain in his R hip. He reports he is sedentary and watches TV during much of the day. He had hx of ESI that did alleviate his symptoms; however, he reports his pain returned the following morning.    Pain:  Pain Intensity: Present: 5/10, Best: 0/10, Worst: 7/10 Pain location: Axial low back and R paraspinal, R lateral thigh/lateral hip Pain Quality: constant in R hip, sharp pain in low back  Numbness/Tingling: Yes, R hip Focal Weakness: Yes, "muscles in legs have gone to sleep, because I'm not as active as I used to be"  Aggravating factors: prolonged sitting, sedentary activity, cold weather  Relieving factors: on the move 24-hour pain behavior: worse in AM  How long can you sit: 1 hour History of prior back injury, pain, surgery, or therapy: Yes, hx bilat THA Falls: Has patient fallen in last 6 months? No Follow-up appointment with MD: Yes, f/u with Whitney Meeler, date unknown   Imaging: Yes    MRI Lumbar Spine 10/12/21 Disc spaces: Degenerative disease with disc height loss throughout the thoracolumbar spine.    T12-L1: Mild broad-based disc bulge. No foraminal or central canal stenosis.   L1-L2: Mild broad-based disc bulge. No foraminal or central canal stenosis.   L2-L3: Mild broad-based disc bulge flattening the ventral thecal sac. Mild bilateral facet arthropathy. Mild spinal stenosis. No significant foraminal or central canal stenosis.   L3-L4: Broad-based disc bulge flattening ventral thecal sac. Mild bilateral facet arthropathy. Moderate spinal stenosis. Bilateral subarticular recess stenosis. Moderate bilateral foraminal stenosis.   L4-L5: Broad-based disc bulge. Mild bilateral facet arthropathy. Bilateral subarticular recess stenosis. Moderate bilateral foraminal stenosis, right greater than left. Mild spinal stenosis.   L5-S1: Broad-based disc osteophyte complex. Mild bilateral facet arthropathy. Moderate left foraminal stenosis. Mild right foraminal stenosis. No spinal stenosis.   IMPRESSION: 1. Diffuse lumbar spine spondylosis as described above. 2. No acute osseous injury of the lumbar spine.     By: Kathreen Devoid M.D.   On: 10/12/2021 08:46     Prior level of function: Independent Occupational demands: Pt on disability  Hobbies: yard work, fishing, Location manager  Red flags (bowel/bladder changes, saddle paresthesia, personal history of cancer, h/o spinal tumors, h/o compression fx, h/o abdominal aneurysm, abdominal pain, chills/fever, night sweats, nausea, vomiting, unrelenting pain, first onset of insidious LBP <20 y/o): Negative     Precautions: None   Weight Bearing Restrictions: No   Living Environment Lives with: lives with their family, lives with 7 year old mother Lives in: Mobile home   Patient Goals: Improved pain     PRECAUTIONS: None     OBJECTIVE: (objective measures completed at initial evaluation unless otherwise dated)     Patient Surveys  FOTO 46 (risk-adjusted 37), predicted outcome score of 60     Cognition Patient is oriented to  person, place, and time.  Recent memory is intact.  Remote memory is intact.  Attention span and concentration are intact.  Expressive speech is intact.  Patient's fund of knowledge is within normal limits for educational level.                            Gross Musculoskeletal Assessment Tremor: None Bulk: Normal Tone: Normal No visible step-off along spinal column, no signs of scoliosis     GAIT: Forward flexed posture, mild guarded posture with dec arm swing and trunk rotation       AROM          AROM (Normal range in degrees) AROM  02/12/2022  Lumbar    Flexion (65) WNL  Extension (30) WNL  Right lateral flexion (25) WNL  Left lateral flexion (25) WNL*  Right rotation (30) WNL  Left rotation (30) WNL         Hip Right Left  Flexion (125) 120 (pain at 90 deg hip flexion along R greater trochanter) WNL  Extension (15)      Abduction (40) WNL WNL  Adduction       Internal Rotation (45)      External Rotation (45) WNL WNL         Ankle      Dorsiflexion (20) WNL WNL  Plantarflexion (50)      Inversion (35)      Eversion (15      (* = pain; Blank rows = not tested)     LE MMT:   MMT (out of 5) Right 02/12/2022 Left 02/12/2022 Right  03/20/22 Left  03/20/22  Hip flexion 4 4* 4 4  Hip extension 4- 4- 4 4  Hip abduction 4- 4- 4* 4-  Hip adduction        Hip internal rotation        Hip external rotation        Knee flexion '5 5 5 5  '$ Knee extension '5 5 5 5  '$ Ankle dorsiflexion 4+ 4+    Ankle plantarflexion        Ankle inversion        Ankle eversion        Great toe extension 4+ 4+    (* = pain; Blank rows = not tested)     Sensation Grossly intact to light touch bilateral LEs as determined by testing dermatomes L2-S2. Proprioception, and hot/cold testing deferred on this date.     Reflexes R/L Knee Jerk (L3/4): 2+/2+  Ankle Jerk (S1/2): 3+/3+      Muscle Length Hamstrings: R: Positive, lacking 30 deg L: Positive, lacking 45 deg [place] (quadriceps):  R: Positive L: Positive Thomas (hip flexors): R: Not examined L: Not examined  Palpation   Location LEFT  RIGHT           Lumbar paraspinals 1 1  Quadratus Lumborum      Gluteus Maximus      Gluteus Medius   2  Deep hip external rotators      PSIS      Fortin's Area (SIJ)      Greater Trochanter      (Blank rows = not tested) Graded on 0-4 scale (0 = no pain, 1 = pain, 2 = pain with wincing/grimacing/flinching, 3 = pain with withdrawal, 4 = unwilling to allow palpation), (Blank rows = not tested)     Passive Accessory Intervertebral Motion 02/18/22: Hypomobile T3-T10, L3-5 with no pain at restriction     SPECIAL TESTS Lumbar Radiculopathy and Discogenic: Centralization and Peripheralization (SN 92, -LR 0.12): Not examined Slump (SN 83, -LR 0.32): R: Negative L: Positive SLR (SN 92, -LR 0.29): R: Negative L:  Negative Crossed SLR (SP 90): R: Negative L: Negative   Facet Joint: Extension-Rotation (SN 100, -LR 0.0): R: Negative L: Positive   Lumbar Foraminal Stenosis: Lumbar quadrant (SN 70): R: Positive L: Positive           TODAY'S TREATMENT    SUBJECTIVE: Pt reports he lifted and carried 4 cases of water from [employer] to his truck, steps, then into his home yesterday.  This bothered his back.  This morning he notes 3/10 LBP.     PAIN:  Are you having pain? Yes: NPRS scale: 3/10 Pain location:  lower back        TREATMENT 03/20/22:   There.ex:  NuStep; Level 5, seat 11; x 6 minutes for nervous system downregulation and improved soft tissue extensibility. Discussion on subjective based short term and long term goals.   Repeated B knees to chest: x20  Bridge with Black band around knees for hip abduction moment; 2x12  Seated physioball lumbar roll outs:   Forward: x20   R/L deviations: x20  Standing hip abduction with 3# AW's: x15/LE. Progressed to 5# AW's x15/LE. Required mod multimodal cuing for upright posture     Manual Therapy - for symptom  modulation, soft tissue sensitivity and mobility, joint mobility, ROM    Supine:  STM/DTM and IASTM with Theraband roller: R TFL, distal iliopsoas, proximal to mid-substance rectus femoris and vastus medialis; x 8  PROM into flexion, ER, abduction; x 1 minute- not today  PROM knee to chest with hip adduction: 2x10 seconds, onset of hip pain. Not today  Prone CPA's T7-L5, Grade(s) 3-4 for improved spinal mobility, x3 bouts/segment with 10 sec bouts/segment.         PATIENT EDUCATION:  Education details: see above for patient education details. Progress towards goals   Person educated: Patient Education method: Explanation, Demonstration, and Handouts Education comprehension: verbalized understanding and returned demonstration     HOME EXERCISE PROGRAM: Access Code [phone]     ASSESSMENT:   CLINICAL IMPRESSION: Pt able to complete today's session without c/o onset of R LE sx.  He does still require verbal/tactile cues for posture and technique during strengthening exercises.  He should continue to benefit from incorporating functional strengthening exercises into his treatment plan to promote improved ability to lift/carry items at home without being limited by LBP.  Patient's condition has the potential to improve in response to therapy. Maximum improvement is yet to be obtained. The anticipated improvement is attainable and reasonable in a generally predictable time. Will continue POC  as prescribed  to work towards remaining goals and deficits.   REHAB POTENTIAL: Good   CLINICAL DECISION MAKING: Evolving/moderate complexity   EVALUATION COMPLEXITY: Moderate     GOALS:   SHORT TERM GOALS: Target date: 02/26/2022   Pt will be independent with HEP to improve strength and decrease back pain to improve pain-free function at home and work. Baseline: 02/12/22: Baseline HEP initiated; 03/20/22 Indep, but not performing as prescribed.  Goal status: ON GOING   Pt will complete  walking program at least 3-5 days per week for 10-15 minutes or longer as needed for improved long-term health and to decrease central sensitivity related to chronic pain Baseline: 02/12/22: Initial education given for increase in physical activity and walking program most days of the week. ; 03/20/22: 3x/week, only 1/8th mile due to pain. 15-20 minutes but unable to progress due to pain.  Goal status: ON GOING     LONG TERM GOALS: Target date: 03/27/2022   Pt will increase FOTO to at least 60 to demonstrate significant improvement in function at home and work related to back pain  Baseline: 02/12/22: 59 (risk-adjusted 46); 03/20/22: 44 Goal status: ON GOING   2.  Pt will decrease worst back pain by at least 2 points on the NPRS in order to demonstrate clinically significant reduction in back pain. Baseline: 02/12/22: 7/10 at worst; 02/28/22: 4/10 NPS  Goal status: ACHIEVED   3.  Patient will have MMT 4+/5 or greater for all hip muscles tested indicative of improved strength as needed for improved tolerance of performing yard work and tolerance to loading affected tissues for improved daily physical activity tolerance Baseline: 02/12/22: MMTs for bilateral hips 4- to 4/5 (see table above). ; 03/20/22 see chart above. Grossly similar to eval  Goal status: ON GOING   4.  Patient will be able to sit and perform standing/ambulatory activity > 1 hour as needed for improved ability to participate in social activities and leisure activities as well as complete community outings/errands Baseline: 02/12/22: Pt has to stop sitting at 1 hour due to low back pain.; 03/20/22: sitting 1 hour, unable to do standing and walking tasks > 30 minutes. Reports 4/10 NPS.   Goal status: On going     PLAN: PT FREQUENCY: 2x/week   PT DURATION: 8 weeks   PLANNED INTERVENTIONS: Therapeutic exercises, Therapeutic activity, Neuromuscular re-education, Patient/Family education, Dry Needling, Electrical stimulation, Spinal  manipulation, Spinal mobilization, Cryotherapy, Moist heat, Traction, and Manual therapy   PLAN FOR NEXT SESSION: Continued with manual therapy/STM to improve sensitivity of lumbar paraspinals, gluteal musculature, and R anterior hip. Continue with gentle graded movement with gradual introduction of hip strengthening as tolerated.       Merdis Delay, PT, DPT, OCS  [phone]  Physical Therapist- [facility]  03/24/2022, 1:19 PM

## 2022-03-24 NOTE — Therapy (Signed)
OUTPATIENT PHYSICAL THERAPY TREATMENT NOTE   Patient Name: Corneluis Allston MRN: [id number] DOB:23-Jan-1961, 61 y.o., male Today's Date: 03/24/2022   END OF SESSION:   PT End of Session - 03/24/22 1318     Visit Number 11    Number of Visits 13    Date for PT Re-Evaluation 03/27/22    Authorization Type [employer], VL based on auth    Progress Note Due on Visit 10    PT Start Time 0845    PT Stop Time 0930    PT Time Calculation (min) 45 min    Activity Tolerance Patient limited by pain;Patient tolerated treatment well    Behavior During Therapy [facility] for tasks assessed/performed                 Past Medical History:  Diagnosis Date   Anxiety    Arthritis    Chronic insomnia    COPD (chronic obstructive pulmonary disease) (HCC)    Dyspnea    Esophageal stricture    Esophageal varices (HCC)    GERD (gastroesophageal reflux disease)    Hyperlipidemia    Hypertension    Joint ache    Pre-diabetes    Reflux esophagitis    Sleep apnea    Past Surgical History:  Procedure Laterality Date   COLONOSCOPY WITH PROPOFOL N/A 09/06/2019   Procedure: COLONOSCOPY WITH PROPOFOL;  Surgeon: Lucilla Lame, MD;  Location: [facility] ENDOSCOPY;  Service: Endoscopy;  Laterality: N/A;   ESOPHAGOGASTRODUODENOSCOPY (EGD) WITH PROPOFOL N/A 09/06/2019   Procedure: ESOPHAGOGASTRODUODENOSCOPY (EGD) WITH PROPOFOL;  Surgeon: Lucilla Lame, MD;  Location: [facility] ENDOSCOPY;  Service: Endoscopy;  Laterality: N/A;   TOTAL HIP ARTHROPLASTY Right 05/19/2018   Procedure: TOTAL HIP ARTHROPLASTY;  Surgeon: Dereck Leep, MD;  Location: [facility] ORS;  Service: Orthopedics;  Laterality: Right;   TOTAL HIP ARTHROPLASTY Left 11/16/2019   Procedure: TOTAL HIP ARTHROPLASTY;  Surgeon: Dereck Leep, MD;  Location: [facility] ORS;  Service: Orthopedics;  Laterality: Left;  REQUESTING 3HRS   Patient Active Problem List   Diagnosis Date Noted   Gastroesophageal reflux disease with esophagitis    Screen for colon cancer     Polyp of sigmoid colon    Erythrocytosis 07/29/2019   2-vessel coronary artery disease 07/07/2019   Aortic atherosclerosis ([place]) 07/07/2019   Cholelithiasis 07/07/2019   Obesity (BMI 35.0-39.9 without comorbidity) 07/07/2019   Vitamin B12 deficiency 07/07/2019   H/O total hip arthroplasty 05/19/2018   Left lumbar radiculitis 11/09/2017   DDD (degenerative disc disease), lumbosacral 11/09/2017   Vitamin D insufficiency 10/12/2017   Osteoarthritis of hip (Bilateral) (L>R) 10/12/2017   Lumbar facet syndrome (Bilateral) (L>R) 10/12/2017   Anxiety 09/29/2017   Chronic insomnia 09/29/2017   Cluster headaches 09/29/2017   COPD (chronic obstructive pulmonary disease) ([place]) 09/29/2017   Esophageal varices ([place]) 09/29/2017   Essential hypertension 09/29/2017   Sleep apnea 09/29/2017   Smoker 09/29/2017   Stricture of esophagus 09/29/2017   Type 2 diabetes mellitus without complication ([place]) 15/40/0867   Chronic low back pain (Secondary Area of Pain) (Bilateral) with sciatica (Left) 09/29/2017   Chronic lower extremity pain (Tertiary Area of Pain) (Bilateral) (L>R) 09/29/2017   Chronic hip pain (Primary Area of Pain) (Bilateral) (L>R) 09/29/2017   Chronic knee pain (Fourth Area of Pain) (Bilateral) (L>R) 09/29/2017   Chronic pain syndrome 09/29/2017   Pharmacologic therapy 09/29/2017   Disorder of skeletal system 09/29/2017   Problems influencing health status 09/29/2017   High risk medication use 05/11/2014  Gastritis and duodenitis 11/07/2013   Hyperlipidemia, unspecified 11/07/2013   Variants of migraine, not elsewhere classified, with intractable migraine, so stated, without mention of status migrainosus 11/07/2013        PCP: [facility], [place] REFERRING PROVIDER: Sherren Kerns Meeler, FNP   REFERRING DIAG:  [phone] (ICD-10-CM) - Spinal stenosis, lumbar region with neurogenic claudication  M54.16 (ICD-10-CM) - Radiculopathy, lumbar region  M51.36 (ICD-10-CM) - Other  intervertebral disc degeneration, lumbar region      THERAPY DIAG:  Other low back pain   Pain in right hip   Pain in left hip   Muscle weakness (generalized)   Rationale for Evaluation and Treatment Rehabilitation   PERTINENT HISTORY: Patient is a 61 year old male with primary complaint of low back pain. Pt reports losing his job back in 2010. He reports in 2015, he started having issues with his legs and back. Pt reports pulling something in R anterolateral hip when walking on treadmill. Pt reports having therapy at [facility] and having alleviation of R hip/leg pain with working on his spine. Hx of bilateral THA; pt has remaining pain in his R hip s/p THA from 2019. 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 He denies paresthesias in lower limbs; he does report numbness in R hip. He reports constant pain in his R hip. He reports he is sedentary and watches TV during much of the day. He had hx of ESI that did alleviate his symptoms; however, he reports his pain returned the following morning.    Pain:  Pain Intensity: Present: 5/10, Best: 0/10, Worst: 7/10 Pain location: Axial low back and R paraspinal, R lateral thigh/lateral hip Pain Quality: constant in R hip, sharp pain in low back  Numbness/Tingling: Yes, R hip Focal Weakness: Yes, "muscles in legs have gone to sleep, because I'm not as active as I used to be"  Aggravating factors: prolonged sitting, sedentary activity, cold weather  Relieving factors: on the move 24-hour pain behavior: worse in AM  How long can you sit: 1 hour History of prior back injury, pain, surgery, or therapy: Yes, hx bilat THA Falls: Has patient fallen in last 6 months? No Follow-up appointment with MD: Yes, f/u with Whitney Meeler, date unknown   Imaging: Yes    MRI Lumbar Spine 10/12/21 Disc spaces: Degenerative disease with disc height loss throughout the thoracolumbar spine.    T12-L1: Mild broad-based disc bulge. No foraminal or central canal stenosis.   L1-L2: Mild broad-based disc bulge. No foraminal or central canal stenosis.   L2-L3: Mild broad-based disc bulge flattening the ventral thecal sac. Mild bilateral facet arthropathy. Mild spinal stenosis. No significant foraminal or central canal stenosis.   L3-L4: Broad-based disc bulge flattening ventral thecal sac. Mild bilateral facet arthropathy. Moderate spinal stenosis. Bilateral subarticular recess stenosis. Moderate bilateral foraminal stenosis.   L4-L5: Broad-based disc bulge. Mild bilateral facet arthropathy. Bilateral subarticular recess stenosis. Moderate bilateral foraminal stenosis, right greater than left. Mild spinal stenosis.   L5-S1: Broad-based disc osteophyte complex. Mild bilateral facet arthropathy. Moderate left foraminal stenosis. Mild right foraminal stenosis. No spinal stenosis.   IMPRESSION: 1. Diffuse lumbar spine spondylosis as described above. 2. No acute osseous injury of the lumbar spine.     By: Kathreen Devoid M.D.   On: 10/12/2021 08:46     Prior level of function: Independent Occupational demands: Pt on disability  Hobbies: yard work, fishing, Location manager  Red flags (bowel/bladder changes, saddle paresthesia, personal history of cancer, h/o spinal tumors, h/o compression fx, h/o abdominal aneurysm, abdominal pain, chills/fever, night sweats, nausea, vomiting, unrelenting pain, first onset of insidious LBP <20 y/o): Negative     Precautions: None   Weight Bearing Restrictions: No   Living Environment Lives with: lives with their family, lives with 35 year old mother Lives in: Mobile home   Patient Goals: Improved pain     PRECAUTIONS: None     OBJECTIVE: (objective measures completed at initial evaluation unless otherwise dated)     Patient Surveys  FOTO 70 (risk-adjusted 19), predicted outcome score of 60     Cognition Patient is oriented to  person, place, and time.  Recent memory is intact.  Remote memory is intact.  Attention span and concentration are intact.  Expressive speech is intact.  Patient's fund of knowledge is within normal limits for educational level.                            Gross Musculoskeletal Assessment Tremor: None Bulk: Normal Tone: Normal No visible step-off along spinal column, no signs of scoliosis     GAIT: Forward flexed posture, mild guarded posture with dec arm swing and trunk rotation       AROM          AROM (Normal range in degrees) AROM  02/12/2022  Lumbar    Flexion (65) WNL  Extension (30) WNL  Right lateral flexion (25) WNL  Left lateral flexion (25) WNL*  Right rotation (30) WNL  Left rotation (30) WNL         Hip Right Left  Flexion (125) 120 (pain at 90 deg hip flexion along R greater trochanter) WNL  Extension (15)      Abduction (40) WNL WNL  Adduction       Internal Rotation (45)      External Rotation (45) WNL WNL         Ankle      Dorsiflexion (20) WNL WNL  Plantarflexion (50)      Inversion (35)      Eversion (15      (* = pain; Blank rows = not tested)     LE MMT:   MMT (out of 5) Right 02/12/2022 Left 02/12/2022 Right  03/20/22 Left  03/20/22  Hip flexion 4 4* 4 4  Hip extension 4- 4- 4 4  Hip abduction 4- 4- 4* 4-  Hip adduction        Hip internal rotation        Hip external rotation        Knee flexion '5 5 5 5  '$ Knee extension '5 5 5 5  '$ Ankle dorsiflexion 4+ 4+    Ankle plantarflexion        Ankle inversion        Ankle eversion        Great toe extension 4+ 4+    (* = pain; Blank rows = not tested)     Sensation Grossly intact to light touch bilateral LEs as determined by testing dermatomes L2-S2. Proprioception, and hot/cold testing deferred on this date.     Reflexes R/L Knee Jerk (L3/4): 2+/2+  Ankle Jerk (S1/2): 3+/3+      Muscle Length Hamstrings: R: Positive, lacking 30 deg L: Positive, lacking 45 deg [place] (quadriceps):  R: Positive L: Positive Thomas (hip flexors): R: Not examined L: Not examined  Palpation   Location LEFT  RIGHT           Lumbar paraspinals 1 1  Quadratus Lumborum      Gluteus Maximus      Gluteus Medius   2  Deep hip external rotators      PSIS      Fortin's Area (SIJ)      Greater Trochanter      (Blank rows = not tested) Graded on 0-4 scale (0 = no pain, 1 = pain, 2 = pain with wincing/grimacing/flinching, 3 = pain with withdrawal, 4 = unwilling to allow palpation), (Blank rows = not tested)     Passive Accessory Intervertebral Motion 02/18/22: Hypomobile T3-T10, L3-5 with no pain at restriction     SPECIAL TESTS Lumbar Radiculopathy and Discogenic: Centralization and Peripheralization (SN 92, -LR 0.12): Not examined Slump (SN 83, -LR 0.32): R: Negative L: Positive SLR (SN 92, -LR 0.29): R: Negative L:  Negative Crossed SLR (SP 90): R: Negative L: Negative   Facet Joint: Extension-Rotation (SN 100, -LR 0.0): R: Negative L: Positive   Lumbar Foraminal Stenosis: Lumbar quadrant (SN 70): R: Positive L: Positive           TODAY'S TREATMENT    SUBJECTIVE: Pt reports his back was sore yesterday after lifting 6 cases of 40 water bottles at grocery store into his vehicle, out onto the steps at home, then into his house.  He also did some yardwork.      PAIN:  Are you having pain? Yes: NPRS scale: 3/10 Pain location: pain R anterior hip, R adductor region and lower back        TREATMENT 03/20/22:   There.ex:  NuStep; Level 5, seat 11; x 6 minutes for nervous system downregulation and improved soft tissue extensibility. Discussion on subjective based short term and long term goals.  Side lying clamshell, blue Tband; 3x12, on each side. VC's for form/technique with eccentric control   Repeated B knees to chest: x20    Bridge with Black band around knees for hip abduction moment; 2x12   Forward/R/L reaches for lumbar mobility: x12/direction.   Seated  physioball lumbar roll outs:   Forward: x20   R/L deviations: x20  Standing hip abduction with 3# AW's: x15/LE. Progressed to 5# AW's x15/LE. Required mod multimodal cuing for upright posture      Manual Therapy - for symptom modulation, soft tissue sensitivity and mobility, joint mobility, ROM    Supine:  STM/DTM and IASTM with Theraband roller: R TFL, distal iliopsoas, proximal to mid-substance rectus femoris and vastus medialis; x 8- not today  PROM quad stretch R 30 seconds x3  Prone CPA's T7-L5, Grade(s) 3-4 for improved spinal mobility, x3 bouts/segment with 10 sec bouts/segment.       PATIENT EDUCATION:  Education details: see above for patient education details. Progress towards goals   Person educated: Patient Education method: Explanation, Demonstration, and Handouts Education comprehension: verbalized understanding and returned demonstration     HOME EXERCISE PROGRAM: Access Code [phone]     ASSESSMENT:   CLINICAL IMPRESSION: Pt continues to require verbal/tactile cues for upright trunk posture during standing strengthening exercises today.  He should continue to benefit from skilled functional strengthening progression to promote optimal PT outcome.  The anticipated improvement is attainable and reasonable in a generally predictable time. Will continue POC  as prescribed to work towards remaining goals and deficits.   REHAB POTENTIAL: Good   CLINICAL DECISION MAKING: Evolving/moderate complexity   EVALUATION  COMPLEXITY: Moderate     GOALS:   SHORT TERM GOALS: Target date: 02/26/2022   Pt will be independent with HEP to improve strength and decrease back pain to improve pain-free function at home and work. Baseline: 02/12/22: Baseline HEP initiated; 03/20/22 Indep, but not performing as prescribed.  Goal status: ON GOING   Pt will complete walking program at least 3-5 days per week for 10-15 minutes or longer as needed for improved long-term health and to  decrease central sensitivity related to chronic pain Baseline: 02/12/22: Initial education given for increase in physical activity and walking program most days of the week. ; 03/20/22: 3x/week, only 1/8th mile due to pain. 15-20 minutes but unable to progress due to pain.  Goal status: ON GOING     LONG TERM GOALS: Target date: 03/27/2022   Pt will increase FOTO to at least 60 to demonstrate significant improvement in function at home and work related to back pain  Baseline: 02/12/22: 59 (risk-adjusted 46); 03/20/22: 44 Goal status: ON GOING   2.  Pt will decrease worst back pain by at least 2 points on the NPRS in order to demonstrate clinically significant reduction in back pain. Baseline: 02/12/22: 7/10 at worst; 02/28/22: 4/10 NPS  Goal status: ACHIEVED   3.  Patient will have MMT 4+/5 or greater for all hip muscles tested indicative of improved strength as needed for improved tolerance of performing yard work and tolerance to loading affected tissues for improved daily physical activity tolerance Baseline: 02/12/22: MMTs for bilateral hips 4- to 4/5 (see table above). ; 03/20/22 see chart above. Grossly similar to eval  Goal status: ON GOING   4.  Patient will be able to sit and perform standing/ambulatory activity > 1 hour as needed for improved ability to participate in social activities and leisure activities as well as complete community outings/errands Baseline: 02/12/22: Pt has to stop sitting at 1 hour due to low back pain.; 03/20/22: sitting 1 hour, unable to do standing and walking tasks > 30 minutes. Reports 4/10 NPS.   Goal status: On going     PLAN: PT FREQUENCY: 2x/week   PT DURATION: 8 weeks   PLANNED INTERVENTIONS: Therapeutic exercises, Therapeutic activity, Neuromuscular re-education, Patient/Family education, Dry Needling, Electrical stimulation, Spinal manipulation, Spinal mobilization, Cryotherapy, Moist heat, Traction, and Manual therapy   PLAN FOR NEXT SESSION:  Continued with manual therapy/STM to improve sensitivity of lumbar paraspinals, gluteal musculature, and R anterior hip. Continue with gentle graded movement with gradual introduction of hip strengthening as tolerated.       Merdis Delay, PT, DPT, OCS  [phone]  Physical Therapist- [facility]  03/24/2022, 1:29 PM

## 2022-03-25 NOTE — Therapy (Unsigned)
OUTPATIENT PHYSICAL THERAPY TREATMENT NOTE   Patient Name: Johnny Williams MRN: [id number] DOB:[birth year], 61 y.o., male Today's Date: 03/27/2022   END OF SESSION:   PT End of Session - 03/27/22 0931     Visit Number 12    Number of Visits 13    Date for PT Re-Evaluation 03/27/22    Authorization Type [employer], VL based on auth    Progress Note Due on Visit 10    PT Start Time [phone]    PT Stop Time 0930    PT Time Calculation (min) 43 min    Activity Tolerance Patient limited by pain;Patient tolerated treatment well    Behavior During Therapy [facility] for tasks assessed/performed                  Past Medical History:  Diagnosis Date   Anxiety    Arthritis    Chronic insomnia    COPD (chronic obstructive pulmonary disease) (HCC)    Dyspnea    Esophageal stricture    Esophageal varices (HCC)    GERD (gastroesophageal reflux disease)    Hyperlipidemia    Hypertension    Joint ache    Pre-diabetes    Reflux esophagitis    Sleep apnea    Past Surgical History:  Procedure Laterality Date   COLONOSCOPY WITH PROPOFOL N/A 09/06/2019   Procedure: COLONOSCOPY WITH PROPOFOL;  Surgeon: Lucilla Lame, MD;  Location: [facility] ENDOSCOPY;  Service: Endoscopy;  Laterality: N/A;   ESOPHAGOGASTRODUODENOSCOPY (EGD) WITH PROPOFOL N/A 09/06/2019   Procedure: ESOPHAGOGASTRODUODENOSCOPY (EGD) WITH PROPOFOL;  Surgeon: Lucilla Lame, MD;  Location: [facility] ENDOSCOPY;  Service: Endoscopy;  Laterality: N/A;   TOTAL HIP ARTHROPLASTY Right 05/19/2018   Procedure: TOTAL HIP ARTHROPLASTY;  Surgeon: Dereck Leep, MD;  Location: [facility] ORS;  Service: Orthopedics;  Laterality: Right;   TOTAL HIP ARTHROPLASTY Left 11/16/2019   Procedure: TOTAL HIP ARTHROPLASTY;  Surgeon: Dereck Leep, MD;  Location: [facility] ORS;  Service: Orthopedics;  Laterality: Left;  REQUESTING 3HRS   Patient Active Problem List   Diagnosis Date Noted   Gastroesophageal reflux disease with esophagitis    Screen for colon cancer     Polyp of sigmoid colon    Erythrocytosis 07/29/2019   2-vessel coronary artery disease 07/07/2019   Aortic atherosclerosis ([place]) 07/07/2019   Cholelithiasis 07/07/2019   Obesity (BMI 35.0-39.9 without comorbidity) 07/07/2019   Vitamin B12 deficiency 07/07/2019   H/O total hip arthroplasty 05/19/2018   Left lumbar radiculitis 11/09/2017   DDD (degenerative disc disease), lumbosacral 11/09/2017   Vitamin D insufficiency 10/12/2017   Osteoarthritis of hip (Bilateral) (L>R) 10/12/2017   Lumbar facet syndrome (Bilateral) (L>R) 10/12/2017   Anxiety 09/29/2017   Chronic insomnia 09/29/2017   Cluster headaches 09/29/2017   COPD (chronic obstructive pulmonary disease) ([place]) 09/29/2017   Esophageal varices ([place]) 09/29/2017   Essential hypertension 09/29/2017   Sleep apnea 09/29/2017   Smoker 09/29/2017   Stricture of esophagus 09/29/2017   Type 2 diabetes mellitus without complication ([place]) 82/50/5397   Chronic low back pain (Secondary Area of Pain) (Bilateral) with sciatica (Left) 09/29/2017   Chronic lower extremity pain (Tertiary Area of Pain) (Bilateral) (L>R) 09/29/2017   Chronic hip pain (Primary Area of Pain) (Bilateral) (L>R) 09/29/2017   Chronic knee pain (Fourth Area of Pain) (Bilateral) (L>R) 09/29/2017   Chronic pain syndrome 09/29/2017   Pharmacologic therapy 09/29/2017   Disorder of skeletal system 09/29/2017   Problems influencing health status 09/29/2017   High risk medication use 05/11/2014  Gastritis and duodenitis 11/07/2013   Hyperlipidemia, unspecified 11/07/2013   Variants of migraine, not elsewhere classified, with intractable migraine, so stated, without mention of status migrainosus 11/07/2013        PCP: [facility], [place] REFERRING PROVIDER: Sherren Kerns Meeler, FNP   REFERRING DIAG:  [phone] (ICD-10-CM) - Spinal stenosis, lumbar region with neurogenic claudication  M54.16 (ICD-10-CM) - Radiculopathy, lumbar region  M51.36 (ICD-10-CM) - Other  intervertebral disc degeneration, lumbar region      THERAPY DIAG:  Other low back pain   Pain in right hip   Pain in left hip   Muscle weakness (generalized)   Rationale for Evaluation and Treatment Rehabilitation   PERTINENT HISTORY: Patient is a 61 year old male with primary complaint of low back pain. Pt reports losing his job back in 2010. He reports in 2015, he started having issues with his legs and back. Pt reports pulling something in R anterolateral hip when walking on treadmill. Pt reports having therapy at [facility] and having alleviation of R hip/leg pain with working on his spine. Hx of bilateral THA; pt has remaining pain in his R hip s/p THA from 2019. 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 He denies paresthesias in lower limbs; he does report numbness in R hip. He reports constant pain in his R hip. He reports he is sedentary and watches TV during much of the day. He had hx of ESI that did alleviate his symptoms; however, he reports his pain returned the following morning.    Pain:  Pain Intensity: Present: 5/10, Best: 0/10, Worst: 7/10 Pain location: Axial low back and R paraspinal, R lateral thigh/lateral hip Pain Quality: constant in R hip, sharp pain in low back  Numbness/Tingling: Yes, R hip Focal Weakness: Yes, "muscles in legs have gone to sleep, because I'm not as active as I used to be"  Aggravating factors: prolonged sitting, sedentary activity, cold weather  Relieving factors: on the move 24-hour pain behavior: worse in AM  How long can you sit: 1 hour History of prior back injury, pain, surgery, or therapy: Yes, hx bilat THA Falls: Has patient fallen in last 6 months? No Follow-up appointment with MD: Yes, f/u with Whitney Meeler, date unknown   Imaging: Yes    MRI Lumbar Spine 10/12/21 Disc spaces: Degenerative disease with disc height loss throughout the thoracolumbar spine.    T12-L1: Mild broad-based disc bulge. No foraminal or central canal stenosis.   L1-L2: Mild broad-based disc bulge. No foraminal or central canal stenosis.   L2-L3: Mild broad-based disc bulge flattening the ventral thecal sac. Mild bilateral facet arthropathy. Mild spinal stenosis. No significant foraminal or central canal stenosis.   L3-L4: Broad-based disc bulge flattening ventral thecal sac. Mild bilateral facet arthropathy. Moderate spinal stenosis. Bilateral subarticular recess stenosis. Moderate bilateral foraminal stenosis.   L4-L5: Broad-based disc bulge. Mild bilateral facet arthropathy. Bilateral subarticular recess stenosis. Moderate bilateral foraminal stenosis, right greater than left. Mild spinal stenosis.   L5-S1: Broad-based disc osteophyte complex. Mild bilateral facet arthropathy. Moderate left foraminal stenosis. Mild right foraminal stenosis. No spinal stenosis.   IMPRESSION: 1. Diffuse lumbar spine spondylosis as described above. 2. No acute osseous injury of the lumbar spine.     By: Kathreen Devoid M.D.   On: 10/12/2021 08:46     Prior level of function: Independent Occupational demands: Pt on disability  Hobbies: yard work, fishing, Location manager  Red flags (bowel/bladder changes, saddle paresthesia, personal history of cancer, h/o spinal tumors, h/o compression fx, h/o abdominal aneurysm, abdominal pain, chills/fever, night sweats, nausea, vomiting, unrelenting pain, first onset of insidious LBP <20 y/o): Negative     Precautions: None   Weight Bearing Restrictions: No   Living Environment Lives with: lives with their family, lives with 71 year old mother Lives in: Mobile home   Patient Goals: Improved pain     PRECAUTIONS: None     OBJECTIVE: (objective measures completed at initial evaluation unless otherwise dated)     Patient Surveys  FOTO 62 (risk-adjusted 77), predicted outcome score of 60     Cognition Patient is oriented to  person, place, and time.  Recent memory is intact.  Remote memory is intact.  Attention span and concentration are intact.  Expressive speech is intact.  Patient's fund of knowledge is within normal limits for educational level.                            Gross Musculoskeletal Assessment Tremor: None Bulk: Normal Tone: Normal No visible step-off along spinal column, no signs of scoliosis     GAIT: Forward flexed posture, mild guarded posture with dec arm swing and trunk rotation       AROM          AROM (Normal range in degrees) AROM  02/12/2022  Lumbar    Flexion (65) WNL  Extension (30) WNL  Right lateral flexion (25) WNL  Left lateral flexion (25) WNL*  Right rotation (30) WNL  Left rotation (30) WNL         Hip Right Left  Flexion (125) 120 (pain at 90 deg hip flexion along R greater trochanter) WNL  Extension (15)      Abduction (40) WNL WNL  Adduction       Internal Rotation (45)      External Rotation (45) WNL WNL         Ankle      Dorsiflexion (20) WNL WNL  Plantarflexion (50)      Inversion (35)      Eversion (15      (* = pain; Blank rows = not tested)     LE MMT:   MMT (out of 5) Right 02/12/2022 Left 02/12/2022 Right  03/20/22 Left  03/20/22  Hip flexion 4 4* 4 4  Hip extension 4- 4- 4 4  Hip abduction 4- 4- 4* 4-  Hip adduction        Hip internal rotation        Hip external rotation        Knee flexion '5 5 5 5  '$ Knee extension '5 5 5 5  '$ Ankle dorsiflexion 4+ 4+    Ankle plantarflexion        Ankle inversion        Ankle eversion        Great toe extension 4+ 4+    (* = pain; Blank rows = not tested)     Sensation Grossly intact to light touch bilateral LEs as determined by testing dermatomes L2-S2. Proprioception, and hot/cold testing deferred on this date.     Reflexes R/L Knee Jerk (L3/4): 2+/2+  Ankle Jerk (S1/2): 3+/3+      Muscle Length Hamstrings: R: Positive, lacking 30 deg L: Positive, lacking 45 deg [place] (quadriceps):  R: Positive L: Positive Thomas (hip flexors): R: Not examined L: Not examined  Palpation   Location LEFT  RIGHT           Lumbar paraspinals 1 1  Quadratus Lumborum      Gluteus Maximus      Gluteus Medius   2  Deep hip external rotators      PSIS      Fortin's Area (SIJ)      Greater Trochanter      (Blank rows = not tested) Graded on 0-4 scale (0 = no pain, 1 = pain, 2 = pain with wincing/grimacing/flinching, 3 = pain with withdrawal, 4 = unwilling to allow palpation), (Blank rows = not tested)     Passive Accessory Intervertebral Motion 02/18/22: Hypomobile T3-T10, L3-5 with no pain at restriction     SPECIAL TESTS Lumbar Radiculopathy and Discogenic: Centralization and Peripheralization (SN 92, -LR 0.12): Not examined Slump (SN 83, -LR 0.32): R: Negative L: Positive SLR (SN 92, -LR 0.29): R: Negative L:  Negative Crossed SLR (SP 90): R: Negative L: Negative   Facet Joint: Extension-Rotation (SN 100, -LR 0.0): R: Negative L: Positive   Lumbar Foraminal Stenosis: Lumbar quadrant (SN 70): R: Positive L: Positive           TODAY'S TREATMENT    SUBJECTIVE: Pt reports more back pain this past week after lifting cases of water. Patient reports pain along R lumbar paraspinal region at arrival to PT. Pt reports mild hip pain at arrival to PT. Pt reports doing well with his HEP.  He reports benefit from PT to date and does wish to continue POC for established 8-week plan.     PAIN:  Are you having pain? Yes: NPRS scale: 3/10 Pain location: R lumbar paraspinal region, R buttock, R proximal anterior thigh         TREATMENT 03/20/22:    Manual Therapy - for symptom modulation, soft tissue sensitivity and mobility, joint mobility, ROM    Supine:  STM/DTM R erector spinae L3-S1 x 20 minutes  Prone CPA's T7-L5, Grade(s) 3-4 for improved spinal mobility, x3 bouts/segment with 10 sec bouts/segment.    *not today* PROM quad stretch R 30 seconds  x3    There.ex:  NuStep; Level 5, seat 11; x 6 minutes for nervous system downregulation and improved soft tissue extensibility - subjective gathered intermittently during this time, 2 minutes unbilled  Lower trunk rotations, hooklying; 1x10 ea dir    Repeated B knees to chest: 1x10   Side lying clamshell, Green Tband; 2x12, on each side. VC's for form/technique with eccentric control   -reduced resistance due to difficulty maintaining proper technique     Seated physioball lumbar roll outs:   Forward: x5, 5 sec hold  R/L deviations: x5, 5 sec hold    Pt edu: Reviewed HEP and continued use of lumbar flexion to modulate back pain   *not today* Standing hip abduction with 3# AW's: x15/LE. Progressed to 5# AW's x15/LE. Required mod multimodal cuing for upright posture Bridge with Black band around knees for hip abduction moment; 2x12 Forward/R/L reaches for lumbar mobility: x12/direction.          PATIENT EDUCATION:  Education details: see above for patient education details. Progress towards goals   Person educated: Patient Education method: Explanation, Demonstration, and Handouts Education comprehension: verbalized understanding and returned demonstration     HOME EXERCISE PROGRAM: Access Code [phone]     ASSESSMENT:   CLINICAL IMPRESSION: Patient has experienced recent flare-up of R lumbar paraspinal pain with lifting cases of water at  home. Pt responds well with manual therapy today and is able to complete mobility work and strengthening exercises in lying without aggravation of back pain. Pt reports minimal low back pain at end of session, but he does have remaining R lateral hip discomfort that is reproduced with lower trunk rotation with lower limbs going to left (relative left lateral flexion and hip adduction). Pt has made fair progress to date, but his condition is complicated by significant contextual factors including chronic R hip pain s/p THA. The  anticipated improvement is attainable and reasonable in a generally predictable time. Will continue POC  as prescribed to work towards remaining goals and deficits.   REHAB POTENTIAL: Good   CLINICAL DECISION MAKING: Evolving/moderate complexity   EVALUATION COMPLEXITY: Moderate     GOALS:   SHORT TERM GOALS: Target date: 02/26/2022   Pt will be independent with HEP to improve strength and decrease back pain to improve pain-free function at home and work. Baseline: 02/12/22: Baseline HEP initiated; 03/20/22 Indep, but not performing as prescribed.  Goal status: ON GOING   Pt will complete walking program at least 3-5 days per week for 10-15 minutes or longer as needed for improved long-term health and to decrease central sensitivity related to chronic pain Baseline: 02/12/22: Initial education given for increase in physical activity and walking program most days of the week. ; 03/20/22: 3x/week, only 1/8th mile due to pain. 15-20 minutes but unable to progress due to pain.  Goal status: ON GOING     LONG TERM GOALS: Target date: 03/27/2022   Pt will increase FOTO to at least 60 to demonstrate significant improvement in function at home and work related to back pain  Baseline: 02/12/22: 59 (risk-adjusted 46); 03/20/22: 44 Goal status: ON GOING   2.  Pt will decrease worst back pain by at least 2 points on the NPRS in order to demonstrate clinically significant reduction in back pain. Baseline: 02/12/22: 7/10 at worst; 02/28/22: 4/10 NPS  Goal status: ACHIEVED   3.  Patient will have MMT 4+/5 or greater for all hip muscles tested indicative of improved strength as needed for improved tolerance of performing yard work and tolerance to loading affected tissues for improved daily physical activity tolerance Baseline: 02/12/22: MMTs for bilateral hips 4- to 4/5 (see table above). ; 03/20/22 see chart above. Grossly similar to eval  Goal status: ON GOING   4.  Patient will be able to sit and perform  standing/ambulatory activity > 1 hour as needed for improved ability to participate in social activities and leisure activities as well as complete community outings/errands Baseline: 02/12/22: Pt has to stop sitting at 1 hour due to low back pain.; 03/20/22: sitting 1 hour, unable to do standing and walking tasks > 30 minutes. Reports 4/10 NPS.   Goal status: On going     PLAN: PT FREQUENCY: 2x/week   PT DURATION: 8 weeks   PLANNED INTERVENTIONS: Therapeutic exercises, Therapeutic activity, Neuromuscular re-education, Patient/Family education, Dry Needling, Electrical stimulation, Spinal manipulation, Spinal mobilization, Cryotherapy, Moist heat, Traction, and Manual therapy   PLAN FOR NEXT SESSION: Continued with manual therapy/STM to improve sensitivity of lumbar paraspinals, gluteal musculature, and R anterior hip. Continue with gentle graded movement with gradual introduction of hip strengthening as tolerated.       Valentina Gu, PT, DPT #[id number]  Eilleen Kempf 03/27/2022, 9:33 AM

## 2022-03-26 ENCOUNTER — Ambulatory Visit: Payer: Medicaid Other | Admitting: Physical Therapy

## 2022-03-26 DIAGNOSIS — M5459 Other low back pain: Secondary | ICD-10-CM

## 2022-03-26 DIAGNOSIS — M25551 Pain in right hip: Secondary | ICD-10-CM

## 2022-03-26 DIAGNOSIS — M6281 Muscle weakness (generalized): Secondary | ICD-10-CM

## 2022-03-26 DIAGNOSIS — M25552 Pain in left hip: Secondary | ICD-10-CM

## 2022-03-27 ENCOUNTER — Encounter: Payer: Self-pay | Admitting: Physical Therapy

## 2022-04-01 ENCOUNTER — Ambulatory Visit: Payer: Medicaid Other | Admitting: Physical Therapy

## 2022-04-01 DIAGNOSIS — M6281 Muscle weakness (generalized): Secondary | ICD-10-CM

## 2022-04-01 DIAGNOSIS — M25552 Pain in left hip: Secondary | ICD-10-CM

## 2022-04-01 DIAGNOSIS — M25551 Pain in right hip: Secondary | ICD-10-CM

## 2022-04-01 DIAGNOSIS — M5459 Other low back pain: Secondary | ICD-10-CM | POA: Diagnosis not present

## 2022-04-01 NOTE — Therapy (Signed)
OUTPATIENT PHYSICAL THERAPY TREATMENT NOTE/GOAL UPDATE AND RE-CERTIFICATION   Patient Name: Telvin Reinders MRN: [id number] DOB:[birth year], 61 y.o., male Today's Date: 04/02/2022   END OF SESSION:   PT End of Session - 04/02/22 1253     Visit Number 13    Number of Visits 16    Date for PT Re-Evaluation 05/01/22    Authorization Type [employer], VL based on auth    Progress Note Due on Visit 10    PT Start Time 0907    PT Stop Time 0950    PT Time Calculation (min) 43 min    Activity Tolerance Patient limited by pain;Patient tolerated treatment well    Behavior During Therapy [facility] for tasks assessed/performed                   Past Medical History:  Diagnosis Date   Anxiety    Arthritis    Chronic insomnia    COPD (chronic obstructive pulmonary disease) (HCC)    Dyspnea    Esophageal stricture    Esophageal varices (HCC)    GERD (gastroesophageal reflux disease)    Hyperlipidemia    Hypertension    Joint ache    Pre-diabetes    Reflux esophagitis    Sleep apnea    Past Surgical History:  Procedure Laterality Date   COLONOSCOPY WITH PROPOFOL N/A 09/06/2019   Procedure: COLONOSCOPY WITH PROPOFOL;  Surgeon: Lucilla Lame, MD;  Location: [facility] ENDOSCOPY;  Service: Endoscopy;  Laterality: N/A;   ESOPHAGOGASTRODUODENOSCOPY (EGD) WITH PROPOFOL N/A 09/06/2019   Procedure: ESOPHAGOGASTRODUODENOSCOPY (EGD) WITH PROPOFOL;  Surgeon: Lucilla Lame, MD;  Location: [facility] ENDOSCOPY;  Service: Endoscopy;  Laterality: N/A;   TOTAL HIP ARTHROPLASTY Right 05/19/2018   Procedure: TOTAL HIP ARTHROPLASTY;  Surgeon: Dereck Leep, MD;  Location: [facility] ORS;  Service: Orthopedics;  Laterality: Right;   TOTAL HIP ARTHROPLASTY Left 11/16/2019   Procedure: TOTAL HIP ARTHROPLASTY;  Surgeon: Dereck Leep, MD;  Location: [facility] ORS;  Service: Orthopedics;  Laterality: Left;  REQUESTING 3HRS   Patient Active Problem List   Diagnosis Date Noted   Gastroesophageal reflux disease with  esophagitis    Screen for colon cancer    Polyp of sigmoid colon    Erythrocytosis 07/29/2019   2-vessel coronary artery disease 07/07/2019   Aortic atherosclerosis ([place]) 07/07/2019   Cholelithiasis 07/07/2019   Obesity (BMI 35.0-39.9 without comorbidity) 07/07/2019   Vitamin B12 deficiency 07/07/2019   H/O total hip arthroplasty 05/19/2018   Left lumbar radiculitis 11/09/2017   DDD (degenerative disc disease), lumbosacral 11/09/2017   Vitamin D insufficiency 10/12/2017   Osteoarthritis of hip (Bilateral) (L>R) 10/12/2017   Lumbar facet syndrome (Bilateral) (L>R) 10/12/2017   Anxiety 09/29/2017   Chronic insomnia 09/29/2017   Cluster headaches 09/29/2017   COPD (chronic obstructive pulmonary disease) ([place]) 09/29/2017   Esophageal varices ([place]) 09/29/2017   Essential hypertension 09/29/2017   Sleep apnea 09/29/2017   Smoker 09/29/2017   Stricture of esophagus 09/29/2017   Type 2 diabetes mellitus without complication ([place]) 78/24/2353   Chronic low back pain (Secondary Area of Pain) (Bilateral) with sciatica (Left) 09/29/2017   Chronic lower extremity pain (Tertiary Area of Pain) (Bilateral) (L>R) 09/29/2017   Chronic hip pain (Primary Area of Pain) (Bilateral) (L>R) 09/29/2017   Chronic knee pain (Fourth Area of Pain) (Bilateral) (L>R) 09/29/2017   Chronic pain syndrome 09/29/2017   Pharmacologic therapy 09/29/2017   Disorder of skeletal system 09/29/2017   Problems influencing health status 09/29/2017   High risk  medication use 05/11/2014   Gastritis and duodenitis 11/07/2013   Hyperlipidemia, unspecified 11/07/2013   Variants of migraine, not elsewhere classified, with intractable migraine, so stated, without mention of status migrainosus 11/07/2013        PCP: [facility], [place] REFERRING PROVIDER: Sherren Kerns Meeler, FNP   REFERRING DIAG:  [phone] (ICD-10-CM) - Spinal stenosis, lumbar region with neurogenic claudication  M54.16 (ICD-10-CM) - Radiculopathy, lumbar  region  M51.36 (ICD-10-CM) - Other intervertebral disc degeneration, lumbar region      THERAPY DIAG:  Other low back pain   Pain in right hip   Pain in left hip   Muscle weakness (generalized)   Rationale for Evaluation and Treatment Rehabilitation   PERTINENT HISTORY: Patient is a 61 year old male with primary complaint of low back pain. Pt reports losing his job back in 2010. He reports in 2015, he started having issues with his legs and back. Pt reports pulling something in R anterolateral hip when walking on treadmill. Pt reports having therapy at [facility] and having alleviation of R hip/leg pain with working on his spine. Hx of bilateral THA; pt has remaining pain in his R hip s/p THA from 2019. 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 He denies paresthesias in lower limbs; he does report numbness in R hip. He reports constant pain in his R hip. He reports he is sedentary and watches TV during much of the day. He had hx of ESI that did alleviate his symptoms; however, he reports his pain returned the following morning.    Pain:  Pain Intensity: Present: 5/10, Best: 0/10, Worst: 7/10 Pain location: Axial low back and R paraspinal, R lateral thigh/lateral hip Pain Quality: constant in R hip, sharp pain in low back  Numbness/Tingling: Yes, R hip Focal Weakness: Yes, "muscles in legs have gone to sleep, because I'm not as active as I used to be"  Aggravating factors: prolonged sitting, sedentary activity, cold weather  Relieving factors: on the move 24-hour pain behavior: worse in AM  How long can you sit: 1 hour History of prior back injury, pain, surgery, or therapy: Yes, hx bilat THA Falls: Has patient fallen in last 6 months? No Follow-up appointment with MD: Yes, f/u with Whitney Meeler, date unknown   Imaging: Yes    MRI Lumbar Spine 10/12/21 Disc spaces: Degenerative disease with disc height loss  throughout the thoracolumbar spine.   T12-L1: Mild broad-based disc bulge. No foraminal or central canal stenosis.   L1-L2: Mild broad-based disc bulge. No foraminal or central canal stenosis.   L2-L3: Mild broad-based disc bulge flattening the ventral thecal sac. Mild bilateral facet arthropathy. Mild spinal stenosis. No significant foraminal or central canal stenosis.   L3-L4: Broad-based disc bulge flattening ventral thecal sac. Mild bilateral facet arthropathy. Moderate spinal stenosis. Bilateral subarticular recess stenosis. Moderate bilateral foraminal stenosis.   L4-L5: Broad-based disc bulge. Mild bilateral facet arthropathy. Bilateral subarticular recess stenosis. Moderate bilateral foraminal stenosis, right greater than left. Mild spinal stenosis.   L5-S1: Broad-based disc osteophyte complex. Mild bilateral facet arthropathy. Moderate left foraminal stenosis. Mild right foraminal stenosis. No spinal stenosis.   IMPRESSION: 1. Diffuse lumbar spine spondylosis as described above. 2. No acute osseous injury of the lumbar spine.     By: Kathreen Devoid M.D.   On: 10/12/2021 08:46     Prior level of function: Independent Occupational demands: Pt on disability  Hobbies:  yard work, fishing, Probation officer flags (bowel/bladder changes, saddle paresthesia, personal history of cancer, h/o spinal tumors, h/o compression fx, h/o abdominal aneurysm, abdominal pain, chills/fever, night sweats, nausea, vomiting, unrelenting pain, first onset of insidious LBP <20 y/o): Negative     Precautions: None   Weight Bearing Restrictions: No   Living Environment Lives with: lives with their family, lives with 54 year old mother Lives in: Mobile home   Patient Goals: Improved pain     PRECAUTIONS: None     OBJECTIVE: (objective measures completed at initial evaluation unless otherwise dated)     Patient Surveys  FOTO 7 (risk-adjusted 60), predicted outcome score of 60      Cognition Patient is oriented to person, place, and time.  Recent memory is intact.  Remote memory is intact.  Attention span and concentration are intact.  Expressive speech is intact.  Patient's fund of knowledge is within normal limits for educational level.                            Gross Musculoskeletal Assessment Tremor: None Bulk: Normal Tone: Normal No visible step-off along spinal column, no signs of scoliosis     GAIT: Forward flexed posture, mild guarded posture with dec arm swing and trunk rotation       AROM          AROM (Normal range in degrees) AROM  02/12/2022  Lumbar    Flexion (65) WNL  Extension (30) WNL  Right lateral flexion (25) WNL  Left lateral flexion (25) WNL*  Right rotation (30) WNL  Left rotation (30) WNL         Hip Right Left  Flexion (125) 120 (pain at 90 deg hip flexion along R greater trochanter) WNL  Extension (15)      Abduction (40) WNL WNL  Adduction       Internal Rotation (45)      External Rotation (45) WNL WNL         Ankle      Dorsiflexion (20) WNL WNL  Plantarflexion (50)      Inversion (35)      Eversion (15      (* = pain; Blank rows = not tested)     LE MMT:   MMT (out of 5) Right 02/12/2022 Left 02/12/2022 Right  03/20/22 Left  03/20/22 Right 04/01/22 Left 04/01/22  Hip flexion 4 4* 4 4 4+ 4+  Hip extension 4- 4- _0 4+  Hip abduction 4- 4- 4* 4- 4 4*  Hip adduction          Hip internal rotation          Hip external rotation          Knee flexion _1 Knee extension _2 Ankle dorsiflexion 4+ 4+      Ankle plantarflexion          Ankle inversion          Ankle eversion          Great toe extension 4+ 4+      (* = pain; Blank rows = not tested)     Sensation Grossly intact to light touch bilateral LEs as determined by testing dermatomes L2-S2. Proprioception, and hot/cold testing deferred on this date.     Reflexes R/L Knee Jerk (L3/4): 2+/2+  Ankle Jerk (  S1/2): 3+/3+       Muscle Length Hamstrings: R: Positive, lacking 30 deg L: Positive, lacking 45 deg [place] (quadriceps): R: Positive L: Positive Thomas (hip flexors): R: Not examined L: Not examined       Palpation   Location LEFT  RIGHT           Lumbar paraspinals 1 1  Quadratus Lumborum      Gluteus Maximus      Gluteus Medius   2  Deep hip external rotators      PSIS      Fortin's Area (SIJ)      Greater Trochanter      (Blank rows = not tested) Graded on 0-4 scale (0 = no pain, 1 = pain, 2 = pain with wincing/grimacing/flinching, 3 = pain with withdrawal, 4 = unwilling to allow palpation), (Blank rows = not tested)     Passive Accessory Intervertebral Motion 02/18/22: Hypomobile T3-T10, L3-5 with no pain at restriction     SPECIAL TESTS Lumbar Radiculopathy and Discogenic: Centralization and Peripheralization (SN 92, -LR 0.12): Not examined Slump (SN 83, -LR 0.32): R: Negative L: Positive SLR (SN 92, -LR 0.29): R: Negative L:  Negative Crossed SLR (SP 90): R: Negative L: Negative   Facet Joint: Extension-Rotation (SN 100, -LR 0.0): R: Negative L: Positive   Lumbar Foraminal Stenosis: Lumbar quadrant (SN 70): R: Positive L: Positive           TODAY'S TREATMENT    SUBJECTIVE: Pt reports doing well Thursday, but he reports difficulty putting weight on his RLE on Friday. Patient reports he used ACE wrap on his leg and it got better Friday. Pt reports he was sitting and watching TV last night, and it felt like something hit him in his back. Patient reports pain in his R groin and posterior thigh this past Friday. Patient reports tolerating banded leg press (band strapped to headboard). Patient reports R flank pain at arrival to PT.     PAIN:  Are you having pain? Yes: NPRS scale: 3/10 Pain location: R lumbar paraspinal region, R buttock, R proximal anterior thigh         TREATMENT 03/20/22:    Manual Therapy - for symptom modulation, soft tissue sensitivity and mobility,  joint mobility, ROM    Supine:  STM/DTM R>L erector spinae L3-S1 x  20 minutes  Prone CPA's L1-L5, Grade(s) 3-4 for improved spinal mobility, x3 bouts/segment with 10 sec bouts/segment.    *not today* PROM quad stretch R 30 seconds x3    Trigger Point Dry Needling (TDN), unbilled Education performed with patient regarding potential benefit of TDN. Reviewed precautions and risks with patient. Extensive time spent with pt to ensure full understanding of TDN risks. Pt provided verbal consent to treatment. TDN performed to R and L longissimus lumborum at L4-5 level with 0.30 x 60 single needle placements with local twitch response (LTR). Pistoning technique utilized. Improved pain-free motion following intervention.     There.ex:   GOAL UPDATE PERFORMED  NuStep; Level 5, seat 11; x 6 minutes for nervous system downregulation and improved soft tissue extensibility - subjective gathered intermittently during this time, 2 minutes unbilled  Lower trunk rotations, hooklying; 1x10 ea dir        Pt edu: Discussed regression in hip ER/ABD isotonic intensity and continued work on repeated flexion and mobility home exercises. Discussed PT prognosis and POC.    *not today* Side lying clamshell, Green Tband; 2x12, on each side. VC's for  form/technique with eccentric control   -reduced resistance due to difficulty maintaining proper technique  Seated physioball lumbar roll outs:   Forward: x5, 5 sec hold  R/L deviations: x5, 5 sec hold Repeated B knees to chest: 1x10  Standing hip abduction with 3# AW's: x15/LE. Progressed to 5# AW's x15/LE. Required mod multimodal cuing for upright posture Bridge with Black band around knees for hip abduction moment; 2x12 Forward/R/L reaches for lumbar mobility: x12/direction.          PATIENT EDUCATION:  Education details: see above for patient education details. Progress towards goals   Person educated: Patient Education method: Explanation,  Demonstration, and Handouts Education comprehension: verbalized understanding and returned demonstration     HOME EXERCISE PROGRAM: Access Code [phone]     ASSESSMENT:   CLINICAL IMPRESSION: Patient reports doing well immediately after his last session, but he reports having pain in AM the following day with difficult walking on his R lower limb and he reports notable pain with sitting the previous evening. Patient has improved HEP compliance in regard to continuing established exercises from prior PT and [place] program; however, pt has not participated in walking program as discussed at beginning of PT (pt did express plan to do this at previous follow-ups). Pt's condition is also limited by chronic smoking status (1-1.5 packs/day). He has done well with PT and has improved symptoms within session and pertaining to long-term NPRS goal. He has experienced recent flare-ups with lifting cases of water and with static sitting. Pt has now met his FOTO goal and has improved strength as measured by MMTs. Pt has remaining hip weakness and ongoing low back and hip pain necessitating further PT intervention to improve patient's ability to complete household ADLs and working on his vehicle. Pt will continue to benefit from skilled PT services to address the noted deficits above and improve function.    REHAB POTENTIAL: Good   CLINICAL DECISION MAKING: Evolving/moderate complexity   EVALUATION COMPLEXITY: Moderate     GOALS:   SHORT TERM GOALS: Target date: 02/26/2022   Pt will be independent with HEP to improve strength and decrease back pain to improve pain-free function at home and work. Baseline: 02/12/22: Baseline HEP initiated; 03/20/22 Indep, but not performing as prescribed.   04/01/22: Pt is compliant with HEP Goal status: ACHIEVED    Pt will complete walking program at least 3-5 days per week for 10-15 minutes or longer as needed for improved long-term health and to decrease central  sensitivity related to chronic pain Baseline: 02/12/22: Initial education given for increase in physical activity and walking program most days of the week. ; 03/20/22: 3x/week, only 1/8th mile due to pain. 15-20 minutes but unable to progress due to pain.  04/01/22: pt is not participating in walking program  Goal status: NOT MET      LONG TERM GOALS: Target date: 03/27/2022   Pt will increase FOTO to at least 60 to demonstrate significant improvement in function at home and work related to back pain  Baseline: 02/12/22: 59 (risk-adjusted 46);    03/20/22: 44    04/01/22: 63 Goal status: ACHIEVED   2.  Pt will decrease worst back pain by at least 2 points on the NPRS in order to demonstrate clinically significant reduction in back pain. Baseline: 02/12/22: 7/10 at worst; 02/28/22: 4/10 NPS.  Goal status: ACHIEVED   3.  Patient will have MMT 4+/5 or greater for all hip muscles tested indicative of improved strength as needed for  improved tolerance of performing yard work and tolerance to loading affected tissues for improved daily physical activity tolerance Baseline: 02/12/22: MMTs for bilateral hips 4- to 4/5 (see table above). ; 03/20/22 see chart above. Grossly similar to eval.   04/01/22: Improved, not yet met for hip abductors and R hip extensors (see table above). Goal status: IN PROGRESS   4.  Patient will be able to sit and perform standing/ambulatory activity > 1 hour as needed for improved ability to participate in social activities and leisure activities as well as complete community outings/errands Baseline: 02/12/22: Pt has to stop sitting at 1 hour due to low back pain.; 03/20/22: sitting 1 hour, unable to do standing and walking tasks > 30 minutes. Reports 4/10 NPS.   04/01/22: pt tolerates 20 minutes of sitting, 15-20 minutes of standing/walking tolerated  Goal status: NOT MET      PLAN: PT FREQUENCY: 2x/week   PT DURATION: 6 weeks   PLANNED INTERVENTIONS: Therapeutic exercises,  Therapeutic activity, Neuromuscular re-education, Patient/Family education, Dry Needling, Electrical stimulation, Spinal manipulation, Spinal mobilization, Cryotherapy, Moist heat, Traction, and Manual therapy   PLAN FOR NEXT SESSION: Continued with manual therapy/STM to improve sensitivity of lumbar paraspinals, gluteal musculature, and R anterior hip. Continue with gentle graded movement with continued hip strengthening as tolerated. F/u on response to dry needling.  Recommend continued PT 2x/week for 6 weeks      Valentina Gu, PT, DPT #[id number]  Eilleen Kempf 04/02/2022, 1:04 PM

## 2022-04-02 ENCOUNTER — Encounter: Payer: Self-pay | Admitting: Physical Therapy

## 2022-04-03 ENCOUNTER — Ambulatory Visit: Payer: Medicaid Other | Admitting: Physical Therapy

## 2022-04-08 ENCOUNTER — Encounter: Payer: Self-pay | Admitting: Physical Therapy

## 2022-04-08 ENCOUNTER — Ambulatory Visit: Payer: Medicaid Other | Admitting: Physical Therapy

## 2022-04-08 DIAGNOSIS — M25552 Pain in left hip: Secondary | ICD-10-CM

## 2022-04-08 DIAGNOSIS — M25551 Pain in right hip: Secondary | ICD-10-CM

## 2022-04-08 DIAGNOSIS — M6281 Muscle weakness (generalized): Secondary | ICD-10-CM

## 2022-04-08 DIAGNOSIS — M5459 Other low back pain: Secondary | ICD-10-CM | POA: Diagnosis not present

## 2022-04-08 NOTE — Therapy (Signed)
OUTPATIENT PHYSICAL THERAPY TREATMENT NOTE   Patient Name: Johnny Williams MRN: [id number] DOB:[birth year], 61 y.o., male Today's Date: 04/08/2022   END OF SESSION:   PT End of Session - 04/08/22 0851     Visit Number 14    Number of Visits 16    Date for PT Re-Evaluation 05/01/22    Authorization Type [employer], VL based on auth    Progress Note Due on Visit 10    PT Start Time 0900    PT Stop Time 0943    PT Time Calculation (min) 43 min    Activity Tolerance Patient limited by pain;Patient tolerated treatment well    Behavior During Therapy [facility] for tasks assessed/performed             Past Medical History:  Diagnosis Date   Anxiety    Arthritis    Chronic insomnia    COPD (chronic obstructive pulmonary disease) (HCC)    Dyspnea    Esophageal stricture    Esophageal varices (HCC)    GERD (gastroesophageal reflux disease)    Hyperlipidemia    Hypertension    Joint ache    Pre-diabetes    Reflux esophagitis    Sleep apnea    Past Surgical History:  Procedure Laterality Date   COLONOSCOPY WITH PROPOFOL N/A 09/06/2019   Procedure: COLONOSCOPY WITH PROPOFOL;  Surgeon: Lucilla Lame, MD;  Location: [facility] ENDOSCOPY;  Service: Endoscopy;  Laterality: N/A;   ESOPHAGOGASTRODUODENOSCOPY (EGD) WITH PROPOFOL N/A 09/06/2019   Procedure: ESOPHAGOGASTRODUODENOSCOPY (EGD) WITH PROPOFOL;  Surgeon: Lucilla Lame, MD;  Location: [facility] ENDOSCOPY;  Service: Endoscopy;  Laterality: N/A;   TOTAL HIP ARTHROPLASTY Right 05/19/2018   Procedure: TOTAL HIP ARTHROPLASTY;  Surgeon: Dereck Leep, MD;  Location: [facility] ORS;  Service: Orthopedics;  Laterality: Right;   TOTAL HIP ARTHROPLASTY Left 11/16/2019   Procedure: TOTAL HIP ARTHROPLASTY;  Surgeon: Dereck Leep, MD;  Location: [facility] ORS;  Service: Orthopedics;  Laterality: Left;  REQUESTING 3HRS   Patient Active Problem List   Diagnosis Date Noted   Gastroesophageal reflux disease with esophagitis    Screen for colon cancer     Polyp of sigmoid colon    Erythrocytosis 07/29/2019   2-vessel coronary artery disease 07/07/2019   Aortic atherosclerosis ([place]) 07/07/2019   Cholelithiasis 07/07/2019   Obesity (BMI 35.0-39.9 without comorbidity) 07/07/2019   Vitamin B12 deficiency 07/07/2019   H/O total hip arthroplasty 05/19/2018   Left lumbar radiculitis 11/09/2017   DDD (degenerative disc disease), lumbosacral 11/09/2017   Vitamin D insufficiency 10/12/2017   Osteoarthritis of hip (Bilateral) (L>R) 10/12/2017   Lumbar facet syndrome (Bilateral) (L>R) 10/12/2017   Anxiety 09/29/2017   Chronic insomnia 09/29/2017   Cluster headaches 09/29/2017   COPD (chronic obstructive pulmonary disease) ([place]) 09/29/2017   Esophageal varices ([place]) 09/29/2017   Essential hypertension 09/29/2017   Sleep apnea 09/29/2017   Smoker 09/29/2017   Stricture of esophagus 09/29/2017   Type 2 diabetes mellitus without complication ([place]) 28/36/6294   Chronic low back pain (Secondary Area of Pain) (Bilateral) with sciatica (Left) 09/29/2017   Chronic lower extremity pain (Tertiary Area of Pain) (Bilateral) (L>R) 09/29/2017   Chronic hip pain (Primary Area of Pain) (Bilateral) (L>R) 09/29/2017   Chronic knee pain (Fourth Area of Pain) (Bilateral) (L>R) 09/29/2017   Chronic pain syndrome 09/29/2017   Pharmacologic therapy 09/29/2017   Disorder of skeletal system 09/29/2017   Problems influencing health status 09/29/2017   High risk medication use 05/11/2014   Gastritis and duodenitis 11/07/2013  Hyperlipidemia, unspecified 11/07/2013   Variants of migraine, not elsewhere classified, with intractable migraine, so stated, without mention of status migrainosus 11/07/2013        PCP: [facility], [place] REFERRING PROVIDER: Sherren Kerns Meeler, FNP   REFERRING DIAG:  Q65.784 (ICD-10-CM) - Spinal stenosis, lumbar region with neurogenic claudication  M54.16 (ICD-10-CM) - Radiculopathy, lumbar region  M51.36 (ICD-10-CM) - Other  intervertebral disc degeneration, lumbar region      THERAPY DIAG:  Other low back pain   Pain in right hip   Pain in left hip   Muscle weakness (generalized)   Rationale for Evaluation and Treatment Rehabilitation   PERTINENT HISTORY: Patient is a 61 year old male with primary complaint of low back pain. Pt reports losing his job back in 2010. He reports in 2015, he started having issues with his legs and back. Pt reports pulling something in R anterolateral hip when walking on treadmill. Pt reports having therapy at [facility] and having alleviation of R hip/leg pain with working on his spine. Hx of bilateral THA; pt has remaining pain in his R hip s/p THA from 2019. 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 He denies paresthesias in lower limbs; he does report numbness in R hip. He reports constant pain in his R hip. He reports he is sedentary and watches TV during much of the day. He had hx of ESI that did alleviate his symptoms; however, he reports his pain returned the following morning.    Pain:  Pain Intensity: Present: 5/10, Best: 0/10, Worst: 7/10 Pain location: Axial low back and R paraspinal, R lateral thigh/lateral hip Pain Quality: constant in R hip, sharp pain in low back  Numbness/Tingling: Yes, R hip Focal Weakness: Yes, "muscles in legs have gone to sleep, because I'm not as active as I used to be"  Aggravating factors: prolonged sitting, sedentary activity, cold weather  Relieving factors: on the move 24-hour pain behavior: worse in AM  How long can you sit: 1 hour History of prior back injury, pain, surgery, or therapy: Yes, hx bilat THA Falls: Has patient fallen in last 6 months? No Follow-up appointment with MD: Yes, f/u with Whitney Meeler, date unknown   Imaging: Yes    MRI Lumbar Spine 10/12/21 Disc spaces: Degenerative disease with disc height loss throughout the thoracolumbar spine.    T12-L1: Mild broad-based disc bulge. No foraminal or central canal stenosis.   L1-L2: Mild broad-based disc bulge. No foraminal or central canal stenosis.   L2-L3: Mild broad-based disc bulge flattening the ventral thecal sac. Mild bilateral facet arthropathy. Mild spinal stenosis. No significant foraminal or central canal stenosis.   L3-L4: Broad-based disc bulge flattening ventral thecal sac. Mild bilateral facet arthropathy. Moderate spinal stenosis. Bilateral subarticular recess stenosis. Moderate bilateral foraminal stenosis.   L4-L5: Broad-based disc bulge. Mild bilateral facet arthropathy. Bilateral subarticular recess stenosis. Moderate bilateral foraminal stenosis, right greater than left. Mild spinal stenosis.   L5-S1: Broad-based disc osteophyte complex. Mild bilateral facet arthropathy. Moderate left foraminal stenosis. Mild right foraminal stenosis. No spinal stenosis.   IMPRESSION: 1. Diffuse lumbar spine spondylosis as described above. 2. No acute osseous injury of the lumbar spine.     By: Kathreen Devoid M.D.   On: 10/12/2021 08:46     Prior level of function: Independent Occupational demands: Pt on disability  Hobbies: yard work, fishing, Probation officer flags (  bowel/bladder changes, saddle paresthesia, personal history of cancer, h/o spinal tumors, h/o compression fx, h/o abdominal aneurysm, abdominal pain, chills/fever, night sweats, nausea, vomiting, unrelenting pain, first onset of insidious LBP <20 y/o): Negative     Precautions: None   Weight Bearing Restrictions: No   Living Environment Lives with: lives with their family, lives with 48 year old mother Lives in: Mobile home   Patient Goals: Improved pain     PRECAUTIONS: None     OBJECTIVE: (objective measures completed at initial evaluation unless otherwise dated)     Patient Surveys  FOTO 62 (risk-adjusted 59), predicted outcome score of 60     Cognition Patient is oriented to  person, place, and time.  Recent memory is intact.  Remote memory is intact.  Attention span and concentration are intact.  Expressive speech is intact.  Patient's fund of knowledge is within normal limits for educational level.                            Gross Musculoskeletal Assessment Tremor: None Bulk: Normal Tone: Normal No visible step-off along spinal column, no signs of scoliosis     GAIT: Forward flexed posture, mild guarded posture with dec arm swing and trunk rotation       AROM          AROM (Normal range in degrees) AROM  02/12/2022  Lumbar    Flexion (65) WNL  Extension (30) WNL  Right lateral flexion (25) WNL  Left lateral flexion (25) WNL*  Right rotation (30) WNL  Left rotation (30) WNL         Hip Right Left  Flexion (125) 120 (pain at 90 deg hip flexion along R greater trochanter) WNL  Extension (15)      Abduction (40) WNL WNL  Adduction       Internal Rotation (45)      External Rotation (45) WNL WNL         Ankle      Dorsiflexion (20) WNL WNL  Plantarflexion (50)      Inversion (35)      Eversion (15      (* = pain; Blank rows = not tested)     LE MMT:   MMT (out of 5) Right 02/12/2022 Left 02/12/2022 Right  03/20/22 Left  03/20/22 Right 04/01/22 Left 04/01/22  Hip flexion 4 4* 4 4 4+ 4+  Hip extension 4- 4- _0 4+  Hip abduction 4- 4- 4* 4- 4 4*  Hip adduction          Hip internal rotation          Hip external rotation          Knee flexion _1 Knee extension _2 Ankle dorsiflexion 4+ 4+      Ankle plantarflexion          Ankle inversion          Ankle eversion          Great toe extension 4+ 4+      (* = pain; Blank rows = not tested)     Sensation Grossly intact to light touch bilateral LEs as determined by testing dermatomes L2-S2. Proprioception, and hot/cold testing deferred on this date.     Reflexes R/L Knee Jerk (L3/4): 2+/2+  Ankle Jerk (S1/2): 3+/3+      Muscle Length Hamstrings: R:  Positive, lacking 30 deg L: Positive, lacking 45 deg [place] (quadriceps): R: Positive L: Positive Thomas (hip flexors): R: Not examined L: Not examined       Palpation   Location LEFT  RIGHT           Lumbar paraspinals 1 1  Quadratus Lumborum      Gluteus Maximus      Gluteus Medius   2  Deep hip external rotators      PSIS      Fortin's Area (SIJ)      Greater Trochanter      (Blank rows = not tested) Graded on 0-4 scale (0 = no pain, 1 = pain, 2 = pain with wincing/grimacing/flinching, 3 = pain with withdrawal, 4 = unwilling to allow palpation), (Blank rows = not tested)     Passive Accessory Intervertebral Motion 02/18/22: Hypomobile T3-T10, L3-5 with no pain at restriction     SPECIAL TESTS Lumbar Radiculopathy and Discogenic: Centralization and Peripheralization (SN 92, -LR 0.12): Not examined Slump (SN 83, -LR 0.32): R: Negative L: Positive SLR (SN 92, -LR 0.29): R: Negative L:  Negative Crossed SLR (SP 90): R: Negative L: Negative   04/08/22 Repeated Movement Screen Repeated extension in standing: improving, better (symptoms) Repeated flexion in standing: improving, better     Facet Joint: Extension-Rotation (SN 100, -LR 0.0): R: Negative L: Positive   Lumbar Foraminal Stenosis: Lumbar quadrant (SN 70): R: Positive L: Positive           TODAY'S TREATMENT    SUBJECTIVE: Pt reports his back locked up Saturday evening when he was sitting on his couch and leaned forward. He reports not completing much exercise after last visit. He reports using ice on his back a lot since last session. Patient reports pain primarily affecting R flank at arrival to PT today.    PAIN:  Are you having pain? Yes: NPRS scale: 3-4/10         TREATMENT 04/08/22:    Manual Therapy - for symptom modulation, soft tissue sensitivity and mobility, joint mobility, ROM    Supine:  STM/DTMand IASTM with Hypervolt R>L erector spinae L3-S1, R QL x  20 minutes  Prone CPA's L1-L5,  Grade(s) 3-4 for improved spinal mobility, x3 bouts/segment with 10 sec bouts/segment.    *not today* PROM quad stretch R 30 seconds x3    There.ex:    NuStep; Level 5, seat 11; x 6 minutes for nervous system downregulation and improved soft tissue extensibility - subjective gathered intermittently during this time, 2 minutes unbilled  Repeated extension in standing; 2x15 Repeated flexion in standing; 1x15  Lower trunk rotations, hooklying; 1x10 ea dir     PATIENT EDUCATION: HEP update and review. Discussed use of repeated extension, only modifying back to repeated flexion in sitting if his symptoms are worsening or peripheralizing. Discussed change in PT frequency with new insurance authorization.    *not today* Side lying clamshell, Green Tband; 2x12, on each side. VC's for form/technique with eccentric control   -reduced resistance due to difficulty maintaining proper technique  Seated physioball lumbar roll outs:   Forward: x5, 5 sec hold  R/L deviations: x5, 5 sec hold Repeated B knees to chest: 1x10  Standing hip abduction with 3# AW's: x15/LE. Progressed to 5# AW's x15/LE. Required mod multimodal cuing for upright posture Bridge with Black band around knees for hip abduction moment; 2x12 Forward/R/L reaches for lumbar mobility: x12/direction.          PATIENT EDUCATION:  Education details: see above for patient education details. Progress towards goals   Person educated: Patient Education method: Explanation, Demonstration, and Handouts Education comprehension: verbalized understanding and returned demonstration     HOME EXERCISE PROGRAM: Access Code [phone]     ASSESSMENT:   CLINICAL IMPRESSION: Patient reports using ice frequently and having notable pain after use of dry needling last visit. He reports flare-up this past Saturday with leaning forward on his couch, though pt has previously responded well with flexion-based movements. Completed repeated  movement screen today to determine if pt has specific direction of preference. Pt reports improving symptoms with movement in either direction. Will have pt continue with repeated extension with f/u on response given recent remarkable flare-up with lumbar flexion; pt also to continue with established hip strengthening program from [facility] and mobility exercises given in this episode of care. Pt will continue to benefit from skilled PT services to address the noted deficits above and improve function.    REHAB POTENTIAL: Good   CLINICAL DECISION MAKING: Evolving/moderate complexity   EVALUATION COMPLEXITY: Moderate     GOALS:   SHORT TERM GOALS: Target date: 02/26/2022   Pt will be independent with HEP to improve strength and decrease back pain to improve pain-free function at home and work. Baseline: 02/12/22: Baseline HEP initiated; 03/20/22 Indep, but not performing as prescribed.   04/01/22: Pt is compliant with HEP Goal status: ACHIEVED    Pt will complete walking program at least 3-5 days per week for 10-15 minutes or longer as needed for improved long-term health and to decrease central sensitivity related to chronic pain Baseline: 02/12/22: Initial education given for increase in physical activity and walking program most days of the week. ; 03/20/22: 3x/week, only 1/8th mile due to pain. 15-20 minutes but unable to progress due to pain.  04/01/22: pt is not participating in walking program  Goal status: NOT MET      LONG TERM GOALS: Target date: 03/27/2022   Pt will increase FOTO to at least 60 to demonstrate significant improvement in function at home and work related to back pain  Baseline: 02/12/22: 59 (risk-adjusted 46);    03/20/22: 44    04/01/22: 63 Goal status: ACHIEVED   2.  Pt will decrease worst back pain by at least 2 points on the NPRS in order to demonstrate clinically significant reduction in back pain. Baseline: 02/12/22: 7/10 at worst; 02/28/22: 4/10 NPS.  Goal status:  ACHIEVED   3.  Patient will have MMT 4+/5 or greater for all hip muscles tested indicative of improved strength as needed for improved tolerance of performing yard work and tolerance to loading affected tissues for improved daily physical activity tolerance Baseline: 02/12/22: MMTs for bilateral hips 4- to 4/5 (see table above). ; 03/20/22 see chart above. Grossly similar to eval.   04/01/22: Improved, not yet met for hip abductors and R hip extensors (see table above). Goal status: IN PROGRESS   4.  Patient will be able to sit and perform standing/ambulatory activity > 1 hour as needed for improved ability to participate in social activities and leisure activities as well as complete community outings/errands Baseline: 02/12/22: Pt has to stop sitting at 1 hour due to low back pain.; 03/20/22: sitting 1 hour, unable to do standing and walking tasks > 30 minutes. Reports 4/10 NPS.   04/01/22: pt tolerates 20 minutes of sitting, 15-20 minutes of standing/walking tolerated  Goal status: NOT MET      PLAN: PT FREQUENCY: 2x/week  PT DURATION: 6 weeks   PLANNED INTERVENTIONS: Therapeutic exercises, Therapeutic activity, Neuromuscular re-education, Patient/Family education, Dry Needling, Electrical stimulation, Spinal manipulation, Spinal mobilization, Cryotherapy, Moist heat, Traction, and Manual therapy   PLAN FOR NEXT SESSION: Continued with manual therapy/STM to improve sensitivity of lumbar paraspinals, gluteal musculature, and R anterior hip. Continue with gentle graded movement with continued hip strengthening as tolerated. F/u on response to dry needling.  Recommend continued PT 2x/week for 6 weeks      Valentina Gu, PT, DPT #[id number]  Eilleen Kempf 04/08/2022, 9:07 AM

## 2022-04-10 ENCOUNTER — Encounter: Payer: Medicaid Other | Admitting: Physical Therapy

## 2022-04-15 ENCOUNTER — Ambulatory Visit: Payer: Medicaid Other | Admitting: Physical Therapy

## 2022-04-15 DIAGNOSIS — M25551 Pain in right hip: Secondary | ICD-10-CM

## 2022-04-15 DIAGNOSIS — M5459 Other low back pain: Secondary | ICD-10-CM | POA: Diagnosis not present

## 2022-04-15 DIAGNOSIS — M25552 Pain in left hip: Secondary | ICD-10-CM

## 2022-04-15 DIAGNOSIS — M6281 Muscle weakness (generalized): Secondary | ICD-10-CM

## 2022-04-15 NOTE — Therapy (Signed)
OUTPATIENT PHYSICAL THERAPY TREATMENT NOTE   Patient Name: Johnny Williams MRN: [id number] DOB:11-13-1960, 61 y.o., male Today's Date: 04/15/2022   END OF SESSION:   PT End of Session - 04/15/22 0907     Visit Number 15    Number of Visits 16    Date for PT Re-Evaluation 05/01/22    Authorization Type [employer], VL based on auth    Progress Note Due on Visit 10    PT Start Time 0904    PT Stop Time 0945    PT Time Calculation (min) 41 min    Activity Tolerance Patient limited by pain;Patient tolerated treatment well    Behavior During Therapy [facility] for tasks assessed/performed              Past Medical History:  Diagnosis Date   Anxiety    Arthritis    Chronic insomnia    COPD (chronic obstructive pulmonary disease) (HCC)    Dyspnea    Esophageal stricture    Esophageal varices (HCC)    GERD (gastroesophageal reflux disease)    Hyperlipidemia    Hypertension    Joint ache    Pre-diabetes    Reflux esophagitis    Sleep apnea    Past Surgical History:  Procedure Laterality Date   COLONOSCOPY WITH PROPOFOL N/A 09/06/2019   Procedure: COLONOSCOPY WITH PROPOFOL;  Surgeon: Lucilla Lame, MD;  Location: [facility] ENDOSCOPY;  Service: Endoscopy;  Laterality: N/A;   ESOPHAGOGASTRODUODENOSCOPY (EGD) WITH PROPOFOL N/A 09/06/2019   Procedure: ESOPHAGOGASTRODUODENOSCOPY (EGD) WITH PROPOFOL;  Surgeon: Lucilla Lame, MD;  Location: [facility] ENDOSCOPY;  Service: Endoscopy;  Laterality: N/A;   TOTAL HIP ARTHROPLASTY Right 05/19/2018   Procedure: TOTAL HIP ARTHROPLASTY;  Surgeon: Dereck Leep, MD;  Location: [facility] ORS;  Service: Orthopedics;  Laterality: Right;   TOTAL HIP ARTHROPLASTY Left 11/16/2019   Procedure: TOTAL HIP ARTHROPLASTY;  Surgeon: Dereck Leep, MD;  Location: [facility] ORS;  Service: Orthopedics;  Laterality: Left;  REQUESTING 3HRS   Patient Active Problem List   Diagnosis Date Noted   Gastroesophageal reflux disease with esophagitis    Screen for colon cancer     Polyp of sigmoid colon    Erythrocytosis 07/29/2019   2-vessel coronary artery disease 07/07/2019   Aortic atherosclerosis ([place]) 07/07/2019   Cholelithiasis 07/07/2019   Obesity (BMI 35.0-39.9 without comorbidity) 07/07/2019   Vitamin B12 deficiency 07/07/2019   H/O total hip arthroplasty 05/19/2018   Left lumbar radiculitis 11/09/2017   DDD (degenerative disc disease), lumbosacral 11/09/2017   Vitamin D insufficiency 10/12/2017   Osteoarthritis of hip (Bilateral) (L>R) 10/12/2017   Lumbar facet syndrome (Bilateral) (L>R) 10/12/2017   Anxiety 09/29/2017   Chronic insomnia 09/29/2017   Cluster headaches 09/29/2017   COPD (chronic obstructive pulmonary disease) ([place]) 09/29/2017   Esophageal varices ([place]) 09/29/2017   Essential hypertension 09/29/2017   Sleep apnea 09/29/2017   Smoker 09/29/2017   Stricture of esophagus 09/29/2017   Type 2 diabetes mellitus without complication ([place]) 48/18/5631   Chronic low back pain (Secondary Area of Pain) (Bilateral) with sciatica (Left) 09/29/2017   Chronic lower extremity pain (Tertiary Area of Pain) (Bilateral) (L>R) 09/29/2017   Chronic hip pain (Primary Area of Pain) (Bilateral) (L>R) 09/29/2017   Chronic knee pain (Fourth Area of Pain) (Bilateral) (L>R) 09/29/2017   Chronic pain syndrome 09/29/2017   Pharmacologic therapy 09/29/2017   Disorder of skeletal system 09/29/2017   Problems influencing health status 09/29/2017   High risk medication use 05/11/2014   Gastritis and duodenitis  11/07/2013   Hyperlipidemia, unspecified 11/07/2013   Variants of migraine, not elsewhere classified, with intractable migraine, so stated, without mention of status migrainosus 11/07/2013        PCP: [facility], [place] REFERRING PROVIDER: Sherren Kerns Meeler, FNP   REFERRING DIAG:  [id number] (ICD-10-CM) - Spinal stenosis, lumbar region with neurogenic claudication  M54.16 (ICD-10-CM) - Radiculopathy, lumbar region  M51.36 (ICD-10-CM) - Other  intervertebral disc degeneration, lumbar region      THERAPY DIAG:  Other low back pain   Pain in right hip   Pain in left hip   Muscle weakness (generalized)   Rationale for Evaluation and Treatment Rehabilitation   PERTINENT HISTORY: Patient is a 61 year old male with primary complaint of low back pain. Pt reports losing his job back in 2010. He reports in 2015, he started having issues with his legs and back. Pt reports pulling something in R anterolateral hip when walking on treadmill. Pt reports having therapy at [facility] and having alleviation of R hip/leg pain with working on his spine. Hx of bilateral THA; pt has remaining pain in his R hip s/p THA from 2019. 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 He denies paresthesias in lower limbs; he does report numbness in R hip. He reports constant pain in his R hip. He reports he is sedentary and watches TV during much of the day. He had hx of ESI that did alleviate his symptoms; however, he reports his pain returned the following morning.    Pain:  Pain Intensity: Present: 5/10, Best: 0/10, Worst: 7/10 Pain location: Axial low back and R paraspinal, R lateral thigh/lateral hip Pain Quality: constant in R hip, sharp pain in low back  Numbness/Tingling: Yes, R hip Focal Weakness: Yes, "muscles in legs have gone to sleep, because I'm not as active as I used to be"  Aggravating factors: prolonged sitting, sedentary activity, cold weather  Relieving factors: on the move 24-hour pain behavior: worse in AM  How long can you sit: 1 hour History of prior back injury, pain, surgery, or therapy: Yes, hx bilat THA Falls: Has patient fallen in last 6 months? No Follow-up appointment with MD: Yes, f/u with Whitney Meeler, date unknown   Imaging: Yes    MRI Lumbar Spine 10/12/21 Disc spaces: Degenerative disease with disc height loss throughout the thoracolumbar spine.    T12-L1: Mild broad-based disc bulge. No foraminal or central canal stenosis.   L1-L2: Mild broad-based disc bulge. No foraminal or central canal stenosis.   L2-L3: Mild broad-based disc bulge flattening the ventral thecal sac. Mild bilateral facet arthropathy. Mild spinal stenosis. No significant foraminal or central canal stenosis.   L3-L4: Broad-based disc bulge flattening ventral thecal sac. Mild bilateral facet arthropathy. Moderate spinal stenosis. Bilateral subarticular recess stenosis. Moderate bilateral foraminal stenosis.   L4-L5: Broad-based disc bulge. Mild bilateral facet arthropathy. Bilateral subarticular recess stenosis. Moderate bilateral foraminal stenosis, right greater than left. Mild spinal stenosis.   L5-S1: Broad-based disc osteophyte complex. Mild bilateral facet arthropathy. Moderate left foraminal stenosis. Mild right foraminal stenosis. No spinal stenosis.   IMPRESSION: 1. Diffuse lumbar spine spondylosis as described above. 2. No acute osseous injury of the lumbar spine.     By: Kathreen Devoid M.D.   On: 10/12/2021 08:46     Prior level of function: Independent Occupational demands: Pt on disability  Hobbies: yard work, fishing, Location manager  Red flags (bowel/bladder changes, saddle paresthesia, personal history of cancer, h/o spinal tumors, h/o compression fx, h/o abdominal aneurysm, abdominal pain, chills/fever, night sweats, nausea, vomiting, unrelenting pain, first onset of insidious LBP <20 y/o): Negative     Precautions: None   Weight Bearing Restrictions: No   Living Environment Lives with: lives with their family, lives with 9 year old mother Lives in: Mobile home   Patient Goals: Improved pain     PRECAUTIONS: None     OBJECTIVE: (objective measures completed at initial evaluation unless otherwise dated)     Patient Surveys  FOTO 33 (risk-adjusted 50), predicted outcome score of 60     Cognition Patient is oriented to  person, place, and time.  Recent memory is intact.  Remote memory is intact.  Attention span and concentration are intact.  Expressive speech is intact.  Patient's fund of knowledge is within normal limits for educational level.                            Gross Musculoskeletal Assessment Tremor: None Bulk: Normal Tone: Normal No visible step-off along spinal column, no signs of scoliosis     GAIT: Forward flexed posture, mild guarded posture with dec arm swing and trunk rotation       AROM          AROM (Normal range in degrees) AROM  02/12/2022  Lumbar    Flexion (65) WNL  Extension (30) WNL  Right lateral flexion (25) WNL  Left lateral flexion (25) WNL*  Right rotation (30) WNL  Left rotation (30) WNL         Hip Right Left  Flexion (125) 120 (pain at 90 deg hip flexion along R greater trochanter) WNL  Extension (15)      Abduction (40) WNL WNL  Adduction       Internal Rotation (45)      External Rotation (45) WNL WNL         Ankle      Dorsiflexion (20) WNL WNL  Plantarflexion (50)      Inversion (35)      Eversion (15      (* = pain; Blank rows = not tested)     LE MMT:   MMT (out of 5) Right 02/12/2022 Left 02/12/2022 Right  03/20/22 Left  03/20/22 Right 04/01/22 Left 04/01/22  Hip flexion 4 4* 4 4 4+ 4+  Hip extension 4- 4- _0 4+  Hip abduction 4- 4- 4* 4- 4 4*  Hip adduction          Hip internal rotation          Hip external rotation          Knee flexion _1 Knee extension _2 Ankle dorsiflexion 4+ 4+      Ankle plantarflexion          Ankle inversion          Ankle eversion          Great toe extension 4+ 4+      (* = pain; Blank rows = not tested)     Sensation Grossly intact to light touch bilateral LEs as determined by testing dermatomes L2-S2. Proprioception, and hot/cold testing deferred on this date.     Reflexes R/L Knee Jerk (L3/4): 2+/2+  Ankle Jerk (S1/2): 3+/3+      Muscle Length  Hamstrings: R:  Positive, lacking 30 deg L: Positive, lacking 45 deg [place] (quadriceps): R: Positive L: Positive Thomas (hip flexors): R: Not examined L: Not examined       Palpation   Location LEFT  RIGHT           Lumbar paraspinals 1 1  Quadratus Lumborum      Gluteus Maximus      Gluteus Medius   2  Deep hip external rotators      PSIS      Fortin's Area (SIJ)      Greater Trochanter      (Blank rows = not tested) Graded on 0-4 scale (0 = no pain, 1 = pain, 2 = pain with wincing/grimacing/flinching, 3 = pain with withdrawal, 4 = unwilling to allow palpation), (Blank rows = not tested)     Passive Accessory Intervertebral Motion 02/18/22: Hypomobile T3-T10, L3-5 with no pain at restriction     SPECIAL TESTS Lumbar Radiculopathy and Discogenic: Centralization and Peripheralization (SN 92, -LR 0.12): Not examined Slump (SN 83, -LR 0.32): R: Negative L: Positive SLR (SN 92, -LR 0.29): R: Negative L:  Negative Crossed SLR (SP 90): R: Negative L: Negative   04/08/22 Repeated Movement Screen Repeated extension in standing: improving, better (symptoms) Repeated flexion in standing: improving, better     Facet Joint: Extension-Rotation (SN 100, -LR 0.0): R: Negative L: Positive   Lumbar Foraminal Stenosis: Lumbar quadrant (SN 70): R: Positive L: Positive           TODAY'S TREATMENT   04/15/22  SUBJECTIVE: Pt reports R flank pain. He reports using his TENS unit to manage symptoms since the last follow-up. Patient reports more leg pain recently; he reports symptoms intermittently along R groin and down to R adductor region and calf. Patient reports performing repeated extension at home and tolerating the movement well.     PAIN:  Are you having pain? Yes: NPRS scale: 3-4/10         TREATMENT 04/08/22:    Manual Therapy - for symptom modulation, soft tissue sensitivity and mobility, joint mobility, ROM    Supine:  STM/DTM and IASTM with Hypervolt R>L erector spinae L3-S1, R  QL x  20 minutes   Prone with 2 pillows under pelvis: Prone CPA's L1-L5, Grade(s) 3-4 for improved spinal mobility, x3 bouts/segment with 10 sec bouts/segment.    *not today* PROM quad stretch R 30 seconds x3    There.ex:    NuStep; Level 5, seat 11; x 6 minutes for nervous system downregulation and improved soft tissue extensibility - subjective gathered intermittently during this time, 2 minutes unbilled  Repeated flexion in lying (double knee to chest with blanket looped behind knees); 1x10  Lower trunk rotations, hooklying; 1x10 ea dir   Dying bug; 2x10 alternating     PATIENT EDUCATION: discussed at length MDT philosophy and reverting back to flexion-based management due to apparent peripheralization with repeated extension. Encouraged patient to continue with regular walking regimen, continuing hip strengthening exercises from [facility], and continued repeated flexion and mobility program given during this stint of PT.   *not today* Repeated extension in standing; 2x15 Repeated flexion in standing; 1x15 Side lying clamshell, Green Tband; 2x12, on each side. VC's for form/technique with eccentric control   -reduced resistance due to difficulty maintaining proper technique  Seated physioball lumbar roll outs:   Forward: x5, 5 sec hold  R/L deviations: x5, 5 sec hold Repeated B knees to chest: 1x10  Standing hip  abduction with 3# AW's: x15/LE. Progressed to 5# AW's x15/LE. Required mod multimodal cuing for upright posture Bridge with Black band around knees for hip abduction moment; 2x12 Forward/R/L reaches for lumbar mobility: x12/direction.          PATIENT EDUCATION:  Education details: see above for patient education details. Progress towards goals   Person educated: Patient Education method: Explanation, Demonstration, and Handouts Education comprehension: verbalized understanding and returned demonstration     HOME EXERCISE PROGRAM: Access Code  [phone]     ASSESSMENT:   CLINICAL IMPRESSION: Patient has experienced significant flare-ups of right-sided low back pain over the last 3 weeks and has experienced peripheralizing symptoms with repeated extension. Reverted back to repeated flexion for primary movement on HEP and educated patient on MDT philosophies and holding on movements/positions that peripheralize his symptoms. Patient's condition is complicated by chronic R hip pain in spite of having THA and chronic smoking status. Tapering of cigarettes/day was discussed versus outright cessation, but patient is resistant to suggestions regarding limiting tobacco use. Pt will continue to benefit from skilled PT services to address the noted deficits above and improve function.    REHAB POTENTIAL: Good   CLINICAL DECISION MAKING: Evolving/moderate complexity   EVALUATION COMPLEXITY: Moderate     GOALS:   SHORT TERM GOALS: Target date: 02/26/2022   Pt will be independent with HEP to improve strength and decrease back pain to improve pain-free function at home and work. Baseline: 02/12/22: Baseline HEP initiated; 03/20/22 Indep, but not performing as prescribed.   04/01/22: Pt is compliant with HEP Goal status: ACHIEVED    Pt will complete walking program at least 3-5 days per week for 10-15 minutes or longer as needed for improved long-term health and to decrease central sensitivity related to chronic pain Baseline: 02/12/22: Initial education given for increase in physical activity and walking program most days of the week. ; 03/20/22: 3x/week, only 1/8th mile due to pain. 15-20 minutes but unable to progress due to pain.  04/01/22: pt is not participating in walking program  Goal status: NOT MET      LONG TERM GOALS: Target date: 03/27/2022   Pt will increase FOTO to at least 60 to demonstrate significant improvement in function at home and work related to back pain  Baseline: 02/12/22: 59 (risk-adjusted 46);    03/20/22: 44     04/01/22: 63 Goal status: ACHIEVED   2.  Pt will decrease worst back pain by at least 2 points on the NPRS in order to demonstrate clinically significant reduction in back pain. Baseline: 02/12/22: 7/10 at worst; 02/28/22: 4/10 NPS.  Goal status: ACHIEVED   3.  Patient will have MMT 4+/5 or greater for all hip muscles tested indicative of improved strength as needed for improved tolerance of performing yard work and tolerance to loading affected tissues for improved daily physical activity tolerance Baseline: 02/12/22: MMTs for bilateral hips 4- to 4/5 (see table above). ; 03/20/22 see chart above. Grossly similar to eval.   04/01/22: Improved, not yet met for hip abductors and R hip extensors (see table above). Goal status: IN PROGRESS   4.  Patient will be able to sit and perform standing/ambulatory activity > 1 hour as needed for improved ability to participate in social activities and leisure activities as well as complete community outings/errands Baseline: 02/12/22: Pt has to stop sitting at 1 hour due to low back pain.; 03/20/22: sitting 1 hour, unable to do standing and walking tasks > 30 minutes.  Reports 4/10 NPS.   04/01/22: pt tolerates 20 minutes of sitting, 15-20 minutes of standing/walking tolerated  Goal status: NOT MET      PLAN: PT FREQUENCY: 2x/week   PT DURATION: 6 weeks   PLANNED INTERVENTIONS: Therapeutic exercises, Therapeutic activity, Neuromuscular re-education, Patient/Family education, Dry Needling, Electrical stimulation, Spinal manipulation, Spinal mobilization, Cryotherapy, Moist heat, Traction, and Manual therapy   PLAN FOR NEXT SESSION: Continued with manual therapy/STM to improve sensitivity of lumbar paraspinals, gluteal musculature, and R anterior hip. Continue with gentle graded movement with continued hip strengthening as tolerated. F/u on response with flexion-based program at home.     Valentina Gu, PT, DPT #[id number]  Eilleen Kempf 04/15/2022, 9:07  AM

## 2022-04-17 ENCOUNTER — Encounter: Payer: Self-pay | Admitting: Physical Therapy

## 2022-04-17 ENCOUNTER — Encounter: Payer: Medicaid Other | Admitting: Physical Therapy

## 2022-04-22 ENCOUNTER — Ambulatory Visit: Payer: Medicaid Other | Admitting: Physical Therapy

## 2022-04-22 DIAGNOSIS — M25552 Pain in left hip: Secondary | ICD-10-CM

## 2022-04-22 DIAGNOSIS — M6281 Muscle weakness (generalized): Secondary | ICD-10-CM

## 2022-04-22 DIAGNOSIS — M5459 Other low back pain: Secondary | ICD-10-CM | POA: Diagnosis not present

## 2022-04-22 DIAGNOSIS — M25551 Pain in right hip: Secondary | ICD-10-CM
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
## 2022-04-29 ENCOUNTER — Ambulatory Visit: Payer: Medicaid Other | Attending: Family Medicine | Admitting: Physical Therapy

## 2022-04-29 DIAGNOSIS — M6281 Muscle weakness (generalized): Secondary | ICD-10-CM | POA: Insufficient documentation

## 2022-04-29 DIAGNOSIS — M5459 Other low back pain: Secondary | ICD-10-CM | POA: Diagnosis not present

## 2022-04-29 DIAGNOSIS — M25552 Pain in left hip: Secondary | ICD-10-CM | POA: Insufficient documentation

## 2022-04-29 DIAGNOSIS — M25551 Pain in right hip: Secondary | ICD-10-CM | POA: Diagnosis present

## 2022-04-29 NOTE — Therapy (Unsigned)
OUTPATIENT PHYSICAL THERAPY TREATMENT NOTE/GOAL UPDATE   Patient Name: Johnny Williams MRN: [id number] DOB:11-10-1960, 61 y.o., male Today's Date: 04/29/2022   END OF SESSION:   PT End of Session - 04/30/22 1309     Visit Number 17    Number of Visits 21    Date for PT Re-Evaluation 05/29/22    Authorization Type [employer], VL based on auth    Progress Note Due on Visit 10    PT Start Time 0905    PT Stop Time 0945    PT Time Calculation (min) 40 min    Activity Tolerance Patient limited by pain;Patient tolerated treatment well    Behavior During Therapy [facility] for tasks assessed/performed                Past Medical History:  Diagnosis Date   Anxiety    Arthritis    Chronic insomnia    COPD (chronic obstructive pulmonary disease) (HCC)    Dyspnea    Esophageal stricture    Esophageal varices (HCC)    GERD (gastroesophageal reflux disease)    Hyperlipidemia    Hypertension    Joint ache    Pre-diabetes    Reflux esophagitis    Sleep apnea    Past Surgical History:  Procedure Laterality Date   COLONOSCOPY WITH PROPOFOL N/A 09/06/2019   Procedure: COLONOSCOPY WITH PROPOFOL;  Surgeon: Lucilla Lame, MD;  Location: [facility] ENDOSCOPY;  Service: Endoscopy;  Laterality: N/A;   ESOPHAGOGASTRODUODENOSCOPY (EGD) WITH PROPOFOL N/A 09/06/2019   Procedure: ESOPHAGOGASTRODUODENOSCOPY (EGD) WITH PROPOFOL;  Surgeon: Lucilla Lame, MD;  Location: [facility] ENDOSCOPY;  Service: Endoscopy;  Laterality: N/A;   TOTAL HIP ARTHROPLASTY Right 05/19/2018   Procedure: TOTAL HIP ARTHROPLASTY;  Surgeon: Dereck Leep, MD;  Location: [facility] ORS;  Service: Orthopedics;  Laterality: Right;   TOTAL HIP ARTHROPLASTY Left 11/16/2019   Procedure: TOTAL HIP ARTHROPLASTY;  Surgeon: Dereck Leep, MD;  Location: [facility] ORS;  Service: Orthopedics;  Laterality: Left;  REQUESTING 3HRS   Patient Active Problem List   Diagnosis Date Noted   Gastroesophageal reflux disease with esophagitis    Screen for  colon cancer    Polyp of sigmoid colon    Erythrocytosis 07/29/2019   2-vessel coronary artery disease 07/07/2019   Aortic atherosclerosis ([place]) 07/07/2019   Cholelithiasis 07/07/2019   Obesity (BMI 35.0-39.9 without comorbidity) 07/07/2019   Vitamin B12 deficiency 07/07/2019   H/O total hip arthroplasty 05/19/2018   Left lumbar radiculitis 11/09/2017   DDD (degenerative disc disease), lumbosacral 11/09/2017   Vitamin D insufficiency 10/12/2017   Osteoarthritis of hip (Bilateral) (L>R) 10/12/2017   Lumbar facet syndrome (Bilateral) (L>R) 10/12/2017   Anxiety 09/29/2017   Chronic insomnia 09/29/2017   Cluster headaches 09/29/2017   COPD (chronic obstructive pulmonary disease) ([place]) 09/29/2017   Esophageal varices ([place]) 09/29/2017   Essential hypertension 09/29/2017   Sleep apnea 09/29/2017   Smoker 09/29/2017   Stricture of esophagus 09/29/2017   Type 2 diabetes mellitus without complication ([place]) 85/88/5027   Chronic low back pain (Secondary Area of Pain) (Bilateral) with sciatica (Left) 09/29/2017   Chronic lower extremity pain (Tertiary Area of Pain) (Bilateral) (L>R) 09/29/2017   Chronic hip pain (Primary Area of Pain) (Bilateral) (L>R) 09/29/2017   Chronic knee pain (Fourth Area of Pain) (Bilateral) (L>R) 09/29/2017   Chronic pain syndrome 09/29/2017   Pharmacologic therapy 09/29/2017   Disorder of skeletal system 09/29/2017   Problems influencing health status 09/29/2017   High risk medication use 05/11/2014  Gastritis and duodenitis 11/07/2013   Hyperlipidemia, unspecified 11/07/2013   Variants of migraine, not elsewhere classified, with intractable migraine, so stated, without mention of status migrainosus 11/07/2013        PCP: [facility], [place] REFERRING PROVIDER: Sherren Kerns Meeler, FNP   REFERRING DIAG:  [id number] (ICD-10-CM) - Spinal stenosis, lumbar region with neurogenic claudication  M54.16 (ICD-10-CM) - Radiculopathy, lumbar region  M51.36  (ICD-10-CM) - Other intervertebral disc degeneration, lumbar region      THERAPY DIAG:  Other low back pain   Pain in right hip   Pain in left hip   Muscle weakness (generalized)   Rationale for Evaluation and Treatment Rehabilitation   PERTINENT HISTORY: Patient is a 61 year old male with primary complaint of low back pain. Pt reports losing his job back in 2010. He reports in 2015, he started having issues with his legs and back. Pt reports pulling something in R anterolateral hip when walking on treadmill. Pt reports having therapy at [facility] and having alleviation of R hip/leg pain with working on his spine. Hx of bilateral THA; pt has remaining pain in his R hip s/p THA from 2019. 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 He denies paresthesias in lower limbs; he does report numbness in R hip. He reports constant pain in his R hip. He reports he is sedentary and watches TV during much of the day. He had hx of ESI that did alleviate his symptoms; however, he reports his pain returned the following morning.    Pain:  Pain Intensity: Present: 5/10, Best: 0/10, Worst: 7/10 Pain location: Axial low back and R paraspinal, R lateral thigh/lateral hip Pain Quality: constant in R hip, sharp pain in low back  Numbness/Tingling: Yes, R hip Focal Weakness: Yes, "muscles in legs have gone to sleep, because I'm not as active as I used to be"  Aggravating factors: prolonged sitting, sedentary activity, cold weather  Relieving factors: on the move 24-hour pain behavior: worse in AM  How long can you sit: 1 hour History of prior back injury, pain, surgery, or therapy: Yes, hx bilat THA Falls: Has patient fallen in last 6 months? No Follow-up appointment with MD: Yes, f/u with Whitney Meeler, date unknown   Imaging: Yes    MRI Lumbar Spine 10/12/21 Disc spaces: Degenerative disease with disc height loss throughout the  thoracolumbar spine.   T12-L1: Mild broad-based disc bulge. No foraminal or central canal stenosis.   L1-L2: Mild broad-based disc bulge. No foraminal or central canal stenosis.   L2-L3: Mild broad-based disc bulge flattening the ventral thecal sac. Mild bilateral facet arthropathy. Mild spinal stenosis. No significant foraminal or central canal stenosis.   L3-L4: Broad-based disc bulge flattening ventral thecal sac. Mild bilateral facet arthropathy. Moderate spinal stenosis. Bilateral subarticular recess stenosis. Moderate bilateral foraminal stenosis.   L4-L5: Broad-based disc bulge. Mild bilateral facet arthropathy. Bilateral subarticular recess stenosis. Moderate bilateral foraminal stenosis, right greater than left. Mild spinal stenosis.   L5-S1: Broad-based disc osteophyte complex. Mild bilateral facet arthropathy. Moderate left foraminal stenosis. Mild right foraminal stenosis. No spinal stenosis.   IMPRESSION: 1. Diffuse lumbar spine spondylosis as described above. 2. No acute osseous injury of the lumbar spine.     By: Kathreen Devoid M.D.   On: 10/12/2021 08:46     Prior level of function: Independent Occupational demands: Pt on disability  Hobbies: yard work, fishing, Location manager  Red flags (bowel/bladder changes, saddle paresthesia, personal history of cancer, h/o spinal tumors, h/o compression fx, h/o abdominal aneurysm, abdominal pain, chills/fever, night sweats, nausea, vomiting, unrelenting pain, first onset of insidious LBP <20 y/o): Negative     Precautions: None   Weight Bearing Restrictions: No   Living Environment Lives with: lives with their family, lives with 69 year old mother Lives in: Mobile home   Patient Goals: Improved pain     PRECAUTIONS: None     OBJECTIVE: (objective measures completed at initial evaluation unless otherwise dated)     Patient Surveys  FOTO 15 (risk-adjusted 1), predicted outcome score of 60      Cognition Patient is oriented to person, place, and time.  Recent memory is intact.  Remote memory is intact.  Attention span and concentration are intact.  Expressive speech is intact.  Patient's fund of knowledge is within normal limits for educational level.                            Gross Musculoskeletal Assessment Tremor: None Bulk: Normal Tone: Normal No visible step-off along spinal column, no signs of scoliosis     GAIT: Forward flexed posture, mild guarded posture with dec arm swing and trunk rotation       AROM          AROM (Normal range in degrees) AROM  02/12/2022  Lumbar    Flexion (65) WNL  Extension (30) WNL  Right lateral flexion (25) WNL  Left lateral flexion (25) WNL*  Right rotation (30) WNL  Left rotation (30) WNL         Hip Right Left  Flexion (125) 120 (pain at 90 deg hip flexion along R greater trochanter) WNL  Extension (15)      Abduction (40) WNL WNL  Adduction       Internal Rotation (45)      External Rotation (45) WNL WNL         Ankle      Dorsiflexion (20) WNL WNL  Plantarflexion (50)      Inversion (35)      Eversion (15      (* = pain; Blank rows = not tested)     LE MMT:   MMT (out of 5) Right 02/12/2022 Left 02/12/2022 Right  03/20/22 Left  03/20/22 Right 04/01/22 Left 04/01/22 Right 04/29/22  Left 04/29/22  Hip flexion 4 4* 4 4 4+ 4+ 4+ 4  Hip extension 4- 4- _0 4+ 4+ 4  Hip abduction 4- 4- 4* 4- 4 4* 4+ 4+  Hip adduction            Hip internal rotation            Hip external rotation            Knee flexion _1 Knee extension _2 Ankle dorsiflexion 4+ 4+        Ankle plantarflexion            Ankle inversion            Ankle eversion            Great toe extension 4+ 4+        (* = pain; Blank rows = not tested)     Sensation Grossly intact to light touch bilateral LEs as determined by testing dermatomes  L2-S2. Proprioception, and hot/cold testing deferred on this date.      Reflexes R/L Knee Jerk (L3/4): 2+/2+  Ankle Jerk (S1/2): 3+/3+      Muscle Length Hamstrings: R: Positive, lacking 30 deg L: Positive, lacking 45 deg [place] (quadriceps): R: Positive L: Positive Thomas (hip flexors): R: Not examined L: Not examined       Palpation   Location LEFT  RIGHT           Lumbar paraspinals 1 1  Quadratus Lumborum      Gluteus Maximus      Gluteus Medius   2  Deep hip external rotators      PSIS      Fortin's Area (SIJ)      Greater Trochanter      (Blank rows = not tested) Graded on 0-4 scale (0 = no pain, 1 = pain, 2 = pain with wincing/grimacing/flinching, 3 = pain with withdrawal, 4 = unwilling to allow palpation), (Blank rows = not tested)     Passive Accessory Intervertebral Motion 02/18/22: Hypomobile T3-T10, L3-5 with no pain at restriction     SPECIAL TESTS Lumbar Radiculopathy and Discogenic: Centralization and Peripheralization (SN 92, -LR 0.12): Not examined Slump (SN 83, -LR 0.32): R: Negative L: Positive SLR (SN 92, -LR 0.29): R: Negative L:  Negative Crossed SLR (SP 90): R: Negative L: Negative   04/08/22 Repeated Movement Screen Repeated extension in standing: improving, better (symptoms) Repeated flexion in standing: improving, better     Facet Joint: Extension-Rotation (SN 100, -LR 0.0): R: Negative L: Positive   Lumbar Foraminal Stenosis: Lumbar quadrant (SN 70): R: Positive L: Positive           TODAY'S TREATMENT   04/29/22  SUBJECTIVE: Pt reports his back didn't bother him notably yesterday. Patient reports pain along R groin region mainly yesterday. Patient reports his back pain gradually gets worse with sitting, especially on hard sitting surface. He states it "multiplies" with sitting on wooden chair. Pt reports difficulty with standing on his RLE Monday morning. Pt reports completing regular walking on his treadmill at home up to 20 minutes around 3 days/week. He reports pain in R groin is more significant  than low back pain at this time. Pt feels he has made apparent progress with PT.     PAIN:  Are you having pain? Yes: NPRS scale: 3/10 pain at arrival  Location: Pain in R groin, minimally in low back this AM        TREATMENT    Manual Therapy - for symptom modulation, soft tissue sensitivity and mobility, joint mobility, ROM    Supine:  STM along R TFL, distal iliopsoas; x 10 minutes      *not today* PROM quad stretch R 30 seconds x3 Prone with 2 pillows under pelvis: Prone CPA's L1-L5, Grade(s) 3-4 for improved spinal mobility, x3 bouts/segment with 10 sec bouts/segment.     There.ex:    *GOAL UPDATE PERFORMED  NuStep; Level 5, seat 11; x 6 minutes for nervous system downregulation and improved soft tissue extensibility - subjective gathered intermittently during this time, 1 minute unbilled  Lower trunk rotations, hooklying; 1x10 ea dir   3-way hip, standing; x8 ea dir, bilateral LE; Red Tband looped around bilat ankles   Sit to stand, arms folded across chest; low table, with 8-lb [place] Goblet hold for 1x10, 10-lb [place] Goblet hold for 1x10   PATIENT EDUCATION: discussed ongoing work on repeated flexion with MDT parameters, continued HEP  and regular walking program for improvement with chronic pain/sensitization, prognosis, continued plan of care with expected transition to home program at end of this episode of care for PT    *not today* Dying bug; 2x10 alternating Quadruped sidebend; 1x10 ea dir  Repeated flexion in lying (double knee to chest with blanket looped behind knees); 1x10 Repeated extension in standing; 2x15 Repeated flexion in standing; 1x15 Side lying clamshell, Green Tband; 2x12, on each side. VC's for form/technique with eccentric control   -reduced resistance due to difficulty maintaining proper technique  Seated physioball lumbar roll outs:   Forward: x5, 5 sec hold  R/L deviations: x5, 5 sec hold Repeated B knees to chest: 1x10   Standing hip abduction with 3# AW's: x15/LE. Progressed to 5# AW's x15/LE. Required mod multimodal cuing for upright posture Bridge with Black band around knees for hip abduction moment; 2x12 Forward/R/L reaches for lumbar mobility: x12/direction.          PATIENT EDUCATION:  Education details: see above for patient education details. Progress towards goals   Person educated: Patient Education method: Explanation, Demonstration, and Handouts Education comprehension: verbalized understanding and returned demonstration     HOME EXERCISE PROGRAM: Access Code [phone]     ASSESSMENT:   CLINICAL IMPRESSION: Patient has made fair progress in spite of significant contextual factors contributing to his back pain. Pt did voice concern at previous visit with limited ability to bear weight and squat/bend down with weight on his RLE. HEP has been progressed over last 2 visits for closed-chain strengthening along with continued flexion-based program and thoracolumbar mobility. Pt is participating in regular walking on treadmill versus outdoors within parameters recommended per ACSM guidelines. Patient has met all short-term goals at this point and has met his FOTO goal. Patient has remaining deficits in hip/gluteal strength, positional tolerance for prolonged sitting and prolonged standing, stiffness of thoracic and lumbar spine, and TTP of R>L lumbar paraspinals and gluteal musculature as well as R distal iliopsoas/proximal rectus femoris.  Pt will continue to benefit from skilled PT services to address the noted deficits above and improve function.    REHAB POTENTIAL: Good   CLINICAL DECISION MAKING: Evolving/moderate complexity   EVALUATION COMPLEXITY: Moderate     GOALS:   SHORT TERM GOALS: Target date: 02/26/2022   Pt will be independent with HEP to improve strength and decrease back pain to improve pain-free function at home and work. Baseline: 02/12/22: Baseline HEP initiated; 03/20/22  Indep, but not performing as prescribed.   04/01/22: Pt is compliant with HEP Goal status: ACHIEVED    Pt will complete walking program at least 3-5 days per week for 10-15 minutes or longer as needed for improved long-term health and to decrease central sensitivity related to chronic pain Baseline: 02/12/22: Initial education given for increase in physical activity and walking program most days of the week. ; 03/20/22: 3x/week, only 1/8th mile due to pain. 15-20 minutes but unable to progress due to pain.  04/01/22: pt is not participating in walking program.  04/29/22: Pt reports walking up to 20 minutes on treadmill almost every day. Goal status: ACHIEVED     LONG TERM GOALS: Target date: 03/27/2022   Pt will increase FOTO to at least 60 to demonstrate significant improvement in function at home and work related to back pain  Baseline: 02/12/22: 59 (risk-adjusted 46);    03/20/22: 44    04/01/22: 63 Goal status: ACHIEVED   2.  Pt will decrease worst back pain by at  least 2 points on the NPRS in order to demonstrate clinically significant reduction in back pain. Baseline: 02/12/22: 7/10 at worst; 02/28/22: 4/10 NPS.  Goal status: ACHIEVED   3.  Patient will have MMT 4+/5 or greater for all hip muscles tested indicative of improved strength as needed for improved tolerance of performing yard work and tolerance to loading affected tissues for improved daily physical activity tolerance Baseline: 02/12/22: MMTs for bilateral hips 4- to 4/5 (see table above). ; 03/20/22 see chart above. Grossly similar to eval.    04/01/22: Improved, not yet met for hip abductors and R hip extensors (see table above).   04/29/22: Not yet met for L hip abductors and extensors, otherwise met Goal status: IN PROGRESS   4.  Patient will be able to sit and perform standing/ambulatory activity > 1 hour as needed for improved ability to participate in social activities and leisure activities as well as complete community  outings/errands Baseline: 02/12/22: Pt has to stop sitting at 1 hour due to low back pain.; 03/20/22: sitting 1 hour, unable to do standing and walking tasks > 30 minutes. Reports 4/10 NPS.   04/01/22: pt tolerates 20 minutes of sitting, 15-20 minutes of standing/walking tolerated.  04/29/22: sitting in comfortable chair up to 15-20 minutes, standing/walking up to 25-30 minutes (e.g. when performing yard work) Goal status: ON-GOING     PLAN: PT FREQUENCY: 2x/week   PT DURATION: 4 weeks   PLANNED INTERVENTIONS: Therapeutic exercises, Therapeutic activity, Neuromuscular re-education, Patient/Family education, Dry Needling, Electrical stimulation, Spinal manipulation, Spinal mobilization, Cryotherapy, Moist heat, Traction, and Manual therapy   PLAN FOR NEXT SESSION: Continued with manual therapy/STM to improve sensitivity of lumbar paraspinals, gluteal musculature, and R anterior hip. Continue with gentle graded movement with continued hip strengthening as tolerated. F/u on response with flexion-based program at home.  Recommend continued PT 2x/week for 4 weeks    Valentina Gu, PT, DPT #[id number]  Eilleen Kempf 04/30/2022, 1:10 PM

## 2022-04-30 ENCOUNTER — Encounter: Payer: Self-pay | Admitting: Physical Therapy

## 2022-08-26 ENCOUNTER — Other Ambulatory Visit: Payer: Self-pay | Admitting: Internal Medicine

## 2022-08-26 DIAGNOSIS — I700 Atherosclerosis of aorta: Secondary | ICD-10-CM

## 2022-09-23 ENCOUNTER — Encounter (HOSPITAL_COMMUNITY): Payer: Self-pay

## 2022-09-23 ENCOUNTER — Telehealth (HOSPITAL_COMMUNITY): Payer: Self-pay | Admitting: Emergency Medicine

## 2022-09-23 DIAGNOSIS — R079 Chest pain, unspecified: Secondary | ICD-10-CM

## 2022-09-23 MED ORDER — METOPROLOL TARTRATE 100 MG PO TABS: 100.0000 mg | ORAL_TABLET | Freq: Once | ORAL | 0 refills | Status: AC

## 2022-09-23 MED ORDER — IVABRADINE HCL 5 MG PO TABS: 15.0000 mg | ORAL_TABLET | Freq: Once | ORAL | 0 refills | Status: AC

## 2022-09-23 NOTE — Telephone Encounter (Signed)
Reaching out to patient to offer assistance regarding upcoming cardiac imaging study; pt verbalizes understanding of appt date/time, parking situation and where to check in, pre-test NPO status and medications ordered, and verified current allergies; name and call back number provided for further questions should they arise Marchia Bond RN Navigator Cardiac Imaging [facility] [phone] office [phone] cell   Arrival 745 [facility]IC 100mg  metoprolol + 15mg  ivabradine  Denies iv issues Aware contrast/nitro

## 2022-09-25 ENCOUNTER — Ambulatory Visit
Admission: RE | Admit: 2022-09-25 | Discharge: 2022-09-25 | Disposition: A | Discharge status: Home / Self Care | Payer: Medicaid Other | Source: Ambulatory Visit | Attending: Internal Medicine | Admitting: Internal Medicine

## 2022-09-25 DIAGNOSIS — I2510 Atherosclerotic heart disease of native coronary artery without angina pectoris: Secondary | ICD-10-CM | POA: Diagnosis not present

## 2022-09-25 DIAGNOSIS — I700 Atherosclerosis of aorta: Secondary | ICD-10-CM | POA: Insufficient documentation

## 2022-09-25 MED ORDER — NITROGLYCERIN 0.4 MG SL SUBL
0.8000 mg | SUBLINGUAL_TABLET | Freq: Once | SUBLINGUAL | Status: AC
  Administered 2022-09-25: 0.8 mg via SUBLINGUAL

## 2022-09-25 MED ORDER — IOHEXOL 350 MG/ML SOLN
100.0000 mL | Freq: Once | INTRAVENOUS | Status: AC | PRN
  Administered 2022-09-25: 100 mL via INTRAVENOUS

## 2022-09-25 NOTE — Progress Notes (Signed)
Patient tolerated procedure well. Ambulate w/o difficulty. Denies light headedness or being dizzy. Sitting in chair drinking water provided. Encouraged to drink extra water today and reasoning explained. Verbalized understanding. All questions answered. ABC intact. No further needs. Discharge from procedure area w/o issues.   °

## 2022-10-31 ENCOUNTER — Ambulatory Visit
Admission: RE | Admit: 2022-10-31 | Discharge: 2022-10-31 | Disposition: A | Discharge status: Home / Self Care | Payer: Medicaid Other | Source: Ambulatory Visit | Attending: Acute Care | Admitting: Acute Care

## 2022-10-31 DIAGNOSIS — Z87891 Personal history of nicotine dependence: Secondary | ICD-10-CM | POA: Diagnosis present

## 2022-10-31 DIAGNOSIS — F17210 Nicotine dependence, cigarettes, uncomplicated: Secondary | ICD-10-CM

## 2022-10-31 DIAGNOSIS — Z122 Encounter for screening for malignant neoplasm of respiratory organs: Secondary | ICD-10-CM | POA: Insufficient documentation

## 2022-11-04 ENCOUNTER — Telehealth: Payer: Self-pay | Admitting: Acute Care

## 2022-11-04 NOTE — Telephone Encounter (Signed)
Will forward to nodule pool and Kandice Robinsons, NP, for follow up.   IMPRESSION: 1. Isodense exophytic 1.5 cm upper right renal cortical lesion, increased, indeterminate for renal neoplasm. MRI (preferred) or CT abdomen without and with IV contrast recommended for further characterization. 2. Lung-RADS 2, benign appearance or behavior. Continue annual screening with low-dose chest CT without contrast in 12 months. 3. Three-vessel coronary atherosclerosis. 4. Cholelithiasis. 5. Stable right adrenal adenoma, for which no follow-up imaging is recommended. 6. Aortic Atherosclerosis (ICD10-I70.0) and Emphysema (ICD10-J43.9).   These results will be called to the ordering clinician or representative by the Radiologist Assistant, and communication documented in the PACS or [employer].     Electronically Signed   By: Delbert Phenix M.D.   On: 11/04/2022 09:23

## 2022-11-04 NOTE — Telephone Encounter (Signed)
Tracy calling with call report. For CT scan. Tracy phone number is [phone]. 

## 2022-11-05 ENCOUNTER — Telehealth: Payer: Self-pay | Admitting: Acute Care

## 2022-11-05 DIAGNOSIS — F17210 Nicotine dependence, cigarettes, uncomplicated: Secondary | ICD-10-CM

## 2022-11-05 DIAGNOSIS — Z122 Encounter for screening for malignant neoplasm of respiratory organs: Secondary | ICD-10-CM

## 2022-11-05 DIAGNOSIS — Z87891 Personal history of nicotine dependence: Secondary | ICD-10-CM

## 2022-11-05 NOTE — Telephone Encounter (Signed)
I have called the patient with the results of his low dose CT. I explained that his scan was read as a LR 2, 12 month follow up.  I also explained that there was notation of an Isodense exophytic 1.5 cm upper right renal cortical lesion, Increased in size from 0.9 cm , indeterminate for renal neoplasm. Radiology recommend MRI (preferred) or CT abdomen without and with IV contrast  recommended for further characterization. I told the patient we will let his PCP know so they can order the recommended follow up scan.  He verbalized understanding.  Angelique Blonder, His PCP is Victoriano Lain, [place]. I tried sending a message through [facility] but she is not recognized as a provider. This is the [facility]. [phone] . Can you call them tomorrow and let them know he needs one of these scans done to better evaluate the kidney. He also needs follow up for CAD.  They care closed, so I cannot call tonight.  The patient will not follow up, so we need to make sure they are aware.   Thanks so much, sorry I was unable to get them today.

## 2022-11-06 NOTE — Telephone Encounter (Signed)
Spoke with Everlean Alstrom at [facility] in [place] regarding incidental findings of a renal lesion on pt's lung screening CT. Radiologist has recommended MRI or CT to further evaluate.  She took a message and will route to [facility], [place] to further advise pt. CT results were faxed to PCP. Order placed for 12 mth lung screening CT.

## 2022-11-06 NOTE — Telephone Encounter (Signed)
Called [facility]. Was on hold for 15 minutes. Will try to call back later today.

## 2022-11-06 NOTE — Telephone Encounter (Signed)
See telephone note 11/05/22

## 2022-11-11 ENCOUNTER — Other Ambulatory Visit: Payer: Self-pay | Admitting: Family Medicine

## 2022-11-11 DIAGNOSIS — N2889 Other specified disorders of kidney and ureter: Secondary | ICD-10-CM

## 2022-11-20 ENCOUNTER — Ambulatory Visit: Payer: Medicaid Other

## 2023-03-19 ENCOUNTER — Ambulatory Visit
Admission: RE | Admit: 2023-03-19 | Discharge: 2023-03-19 | Disposition: A | Discharge status: Home / Self Care | Payer: Medicaid Other | Source: Ambulatory Visit | Attending: Family Medicine | Admitting: Family Medicine

## 2023-03-19 DIAGNOSIS — N2889 Other specified disorders of kidney and ureter: Secondary | ICD-10-CM | POA: Insufficient documentation

## 2023-03-19 MED ORDER — GADOBUTROL 1 MMOL/ML IV SOLN
10.0000 mL | Freq: Once | INTRAVENOUS | Status: AC | PRN
  Administered 2023-03-19: 10 mL via INTRAVENOUS

## 2023-08-17 ENCOUNTER — Other Ambulatory Visit: Payer: Self-pay | Admitting: Family Medicine

## 2023-08-17 DIAGNOSIS — F17210 Nicotine dependence, cigarettes, uncomplicated: Secondary | ICD-10-CM

## 2023-11-02 ENCOUNTER — Ambulatory Visit
Admission: RE | Admit: 2023-11-02 | Discharge: 2023-11-02 | Disposition: A | Discharge status: Home / Self Care | Source: Ambulatory Visit | Attending: Acute Care | Admitting: Acute Care

## 2023-11-02 DIAGNOSIS — Z122 Encounter for screening for malignant neoplasm of respiratory organs: Secondary | ICD-10-CM | POA: Insufficient documentation

## 2023-11-02 DIAGNOSIS — Z87891 Personal history of nicotine dependence: Secondary | ICD-10-CM | POA: Insufficient documentation

## 2023-11-02 DIAGNOSIS — F17210 Nicotine dependence, cigarettes, uncomplicated: Secondary | ICD-10-CM | POA: Diagnosis present

## 2023-11-25 ENCOUNTER — Other Ambulatory Visit: Payer: Self-pay

## 2023-11-25 DIAGNOSIS — F17210 Nicotine dependence, cigarettes, uncomplicated: Secondary | ICD-10-CM

## 2023-11-25 DIAGNOSIS — Z87891 Personal history of nicotine dependence: Secondary | ICD-10-CM

## 2023-11-25 DIAGNOSIS — Z122 Encounter for screening for malignant neoplasm of respiratory organs: Secondary | ICD-10-CM

## 2024-03-30 IMAGING — CT CT CHEST LUNG CANCER SCREENING LOW DOSE W/O CM
2 of 5 series · 15 of 40 positions shown, 18 images · non-contrast
Comparison: 10/12/[address].

CLINICAL DATA: 60-year-old asymptomatic male current smoker with 87
pack-year smoking history.



[Series 3: lung 1.00 · axial · 0.74mm/px · z∈[-1474,-1156]mm · 12 of 350 slices shown, 15 images]
[im 16/350  mediastinal]
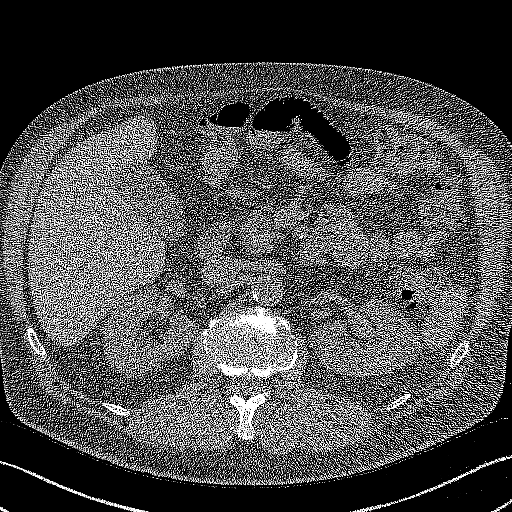
[im 16/350  lung]
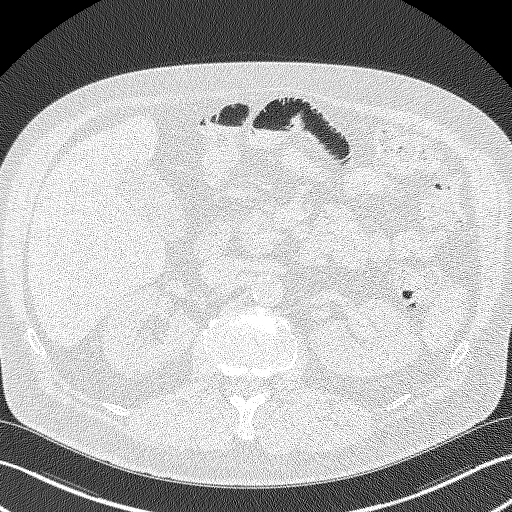
[im 48/350  lung]
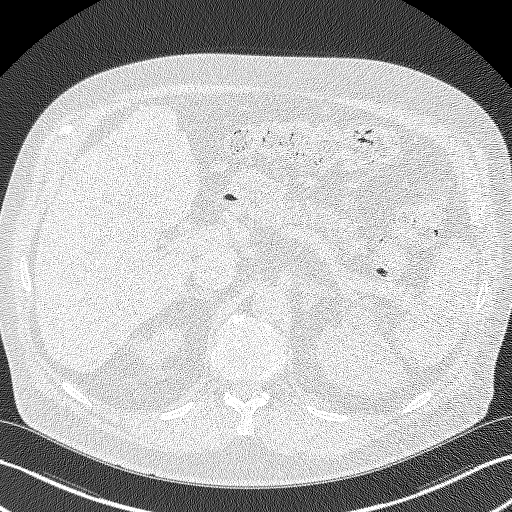
[im 80/350  lung]
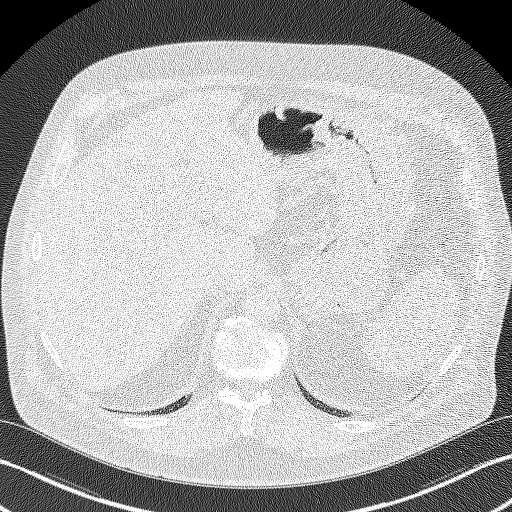
[im 112/350  lung]
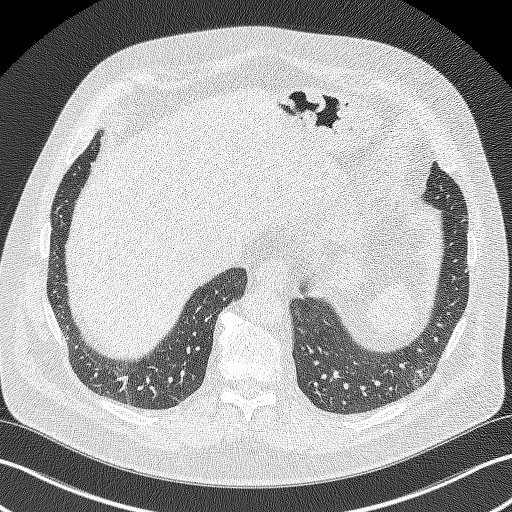
[im 127/350  mediastinal]
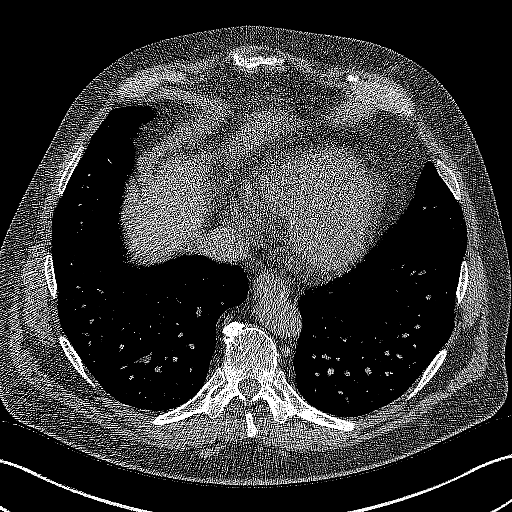
[im 127/350  lung]
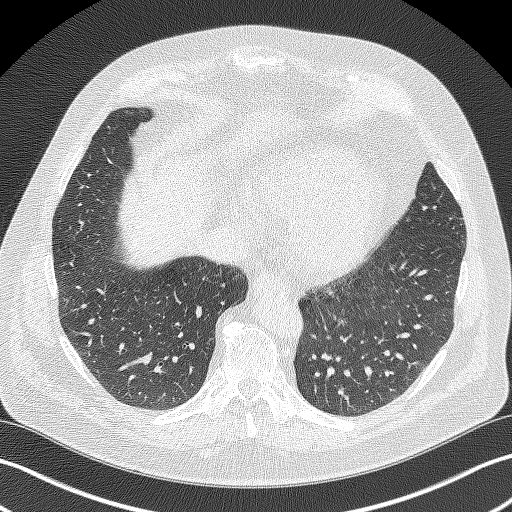
[im 159/350  lung]
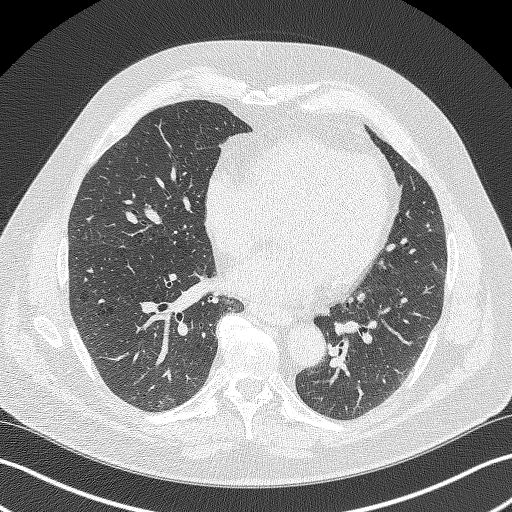
[im 191/350  lung]
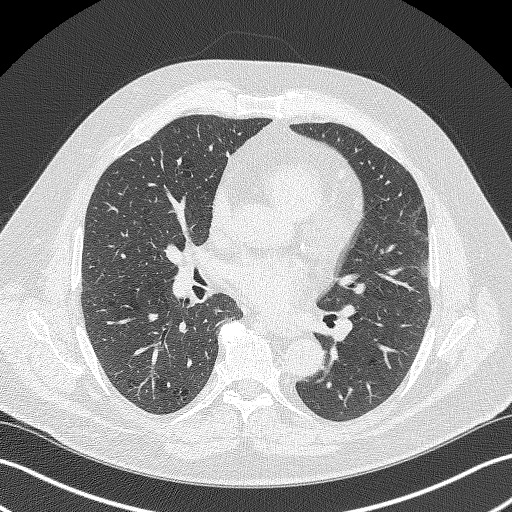
[im 223/350  lung]
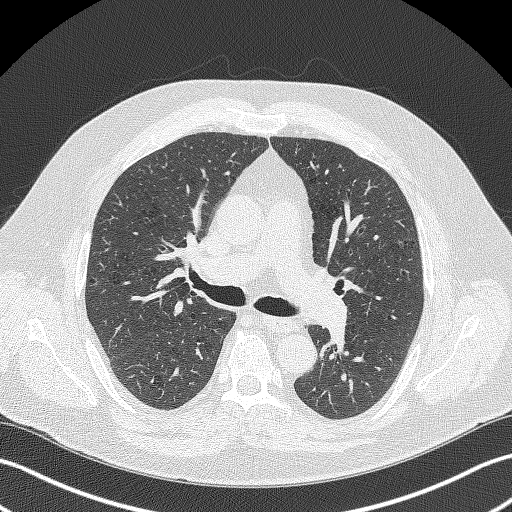
[im 238/350  mediastinal]
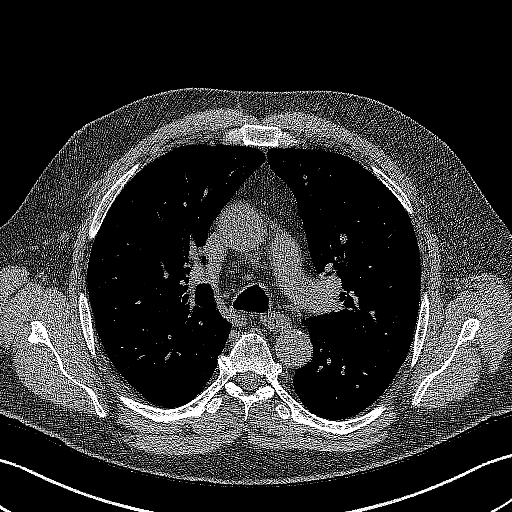
[im 238/350  lung]
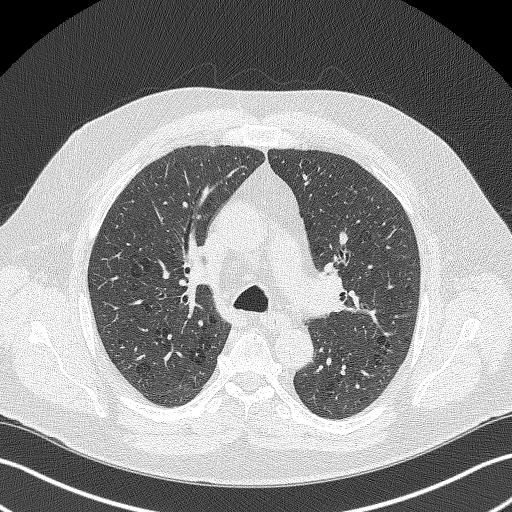
[im 270/350  lung]
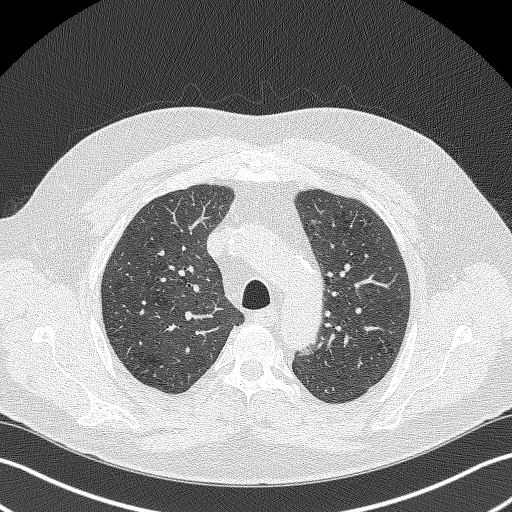
[im 302/350  lung]
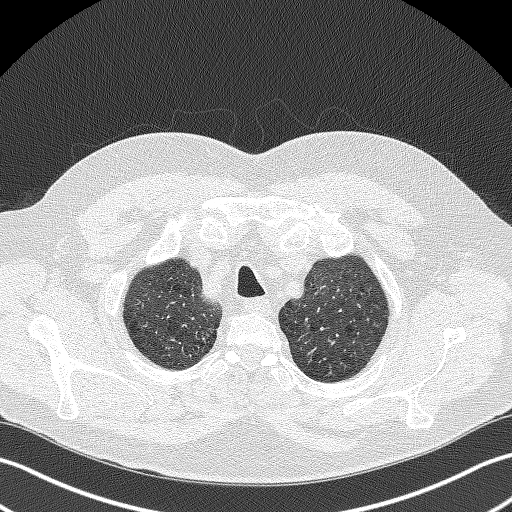
[im 334/350  lung]
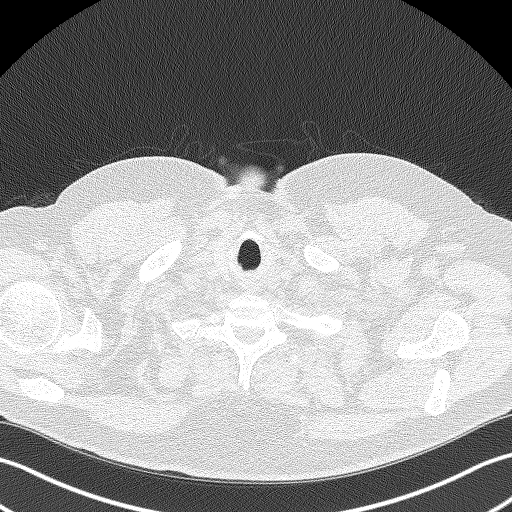

[Series 5: coronals lung 1.00 cor · coronal · 0.69mm/px · 3 of 341 slices shown]
[im 69/341  lung]
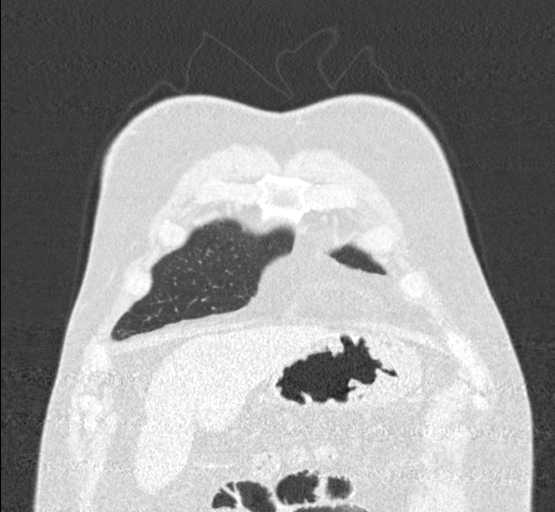
[im 137/341  lung]
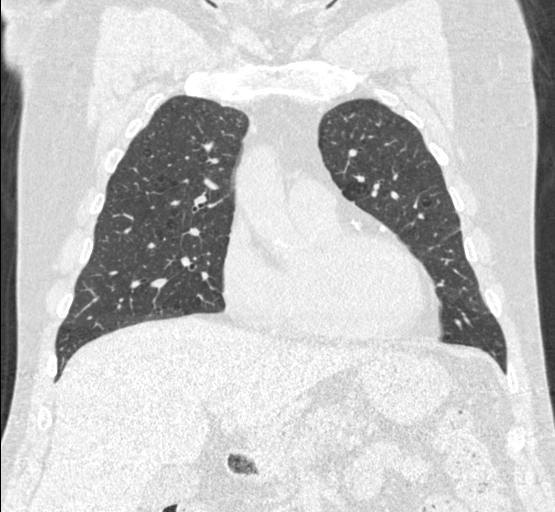
[im 205/341  lung]
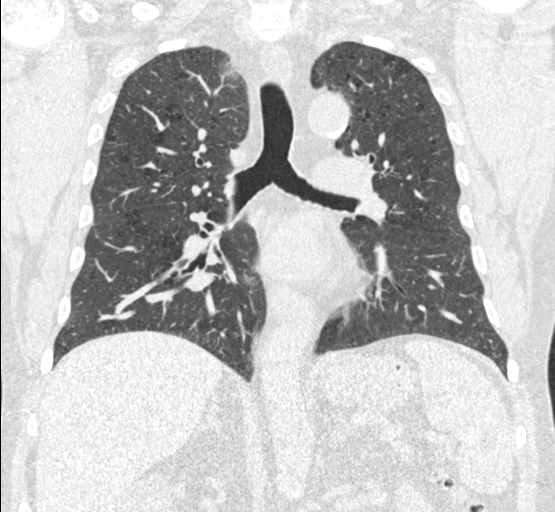

[15 of 40 positions shown; findings below may reference images not displayed]

FINDINGS: Cardiovascular: Normal heart size. No significant pericardial
effusion/thickening. Three-vessel coronary atherosclerosis.
Atherosclerotic nonaneurysmal thoracic aorta. Normal caliber
pulmonary arteries.

Mediastinum/Nodes: No discrete thyroid nodules. Unremarkable
esophagus. No pathologically enlarged axillary, mediastinal or hilar
lymph nodes, noting limited sensitivity for the detection of hilar
adenopathy on this noncontrast study.

Lungs/Pleura: No pneumothorax. No pleural effusion. Moderate
centrilobular emphysema with diffuse bronchial wall thickening. No
acute consolidative airspace disease or lung masses. No significant
growth of previously visualized pulmonary nodules. No new
significant pulmonary nodules.

Upper abdomen: Cholelithiasis. Right adrenal 2.9 cm nodule with
density 5 HU, stable, compatible with a benign adenoma, for which no
follow-up is recommended. Simple 2.6 cm anterior upper left renal
cyst, for which no follow-up is recommended.

Musculoskeletal: No aggressive appearing focal osseous lesions.
Moderate thoracic spondylosis.
IMPRESSION: 1. Lung-RADS 2, benign appearance or behavior. Continue annual
screening with low-dose chest CT without contrast in 12 months.
2. Three-vessel coronary atherosclerosis.
3. Cholelithiasis.
4. Stable right adrenal adenoma, for which no follow-up is
recommended.
5. Aortic Atherosclerosis ([id number]) and Emphysema ([id number]).
